# Patient Record
Sex: Female | Born: 1950 | Race: White | Hispanic: No | Marital: Single | Smoking: Former smoker
Health system: Southern US, Community
[De-identification: ages and names within clinical notes are randomized; demographics above are authoritative.]

## PROBLEM LIST (undated history)

## (undated) DIAGNOSIS — F419 Anxiety disorder, unspecified: Secondary | ICD-10-CM

## (undated) DIAGNOSIS — I10 Essential (primary) hypertension: Secondary | ICD-10-CM

## (undated) DIAGNOSIS — J45909 Unspecified asthma, uncomplicated: Secondary | ICD-10-CM

## (undated) DIAGNOSIS — C801 Malignant (primary) neoplasm, unspecified: Secondary | ICD-10-CM

## (undated) DIAGNOSIS — E785 Hyperlipidemia, unspecified: Secondary | ICD-10-CM

## (undated) DIAGNOSIS — A419 Sepsis, unspecified organism: Secondary | ICD-10-CM

## (undated) DIAGNOSIS — J189 Pneumonia, unspecified organism: Secondary | ICD-10-CM

## (undated) DIAGNOSIS — R51 Headache: Secondary | ICD-10-CM

## (undated) DIAGNOSIS — J449 Chronic obstructive pulmonary disease, unspecified: Secondary | ICD-10-CM

## (undated) HISTORY — DX: Anxiety disorder, unspecified: F41.9

## (undated) HISTORY — PX: COSMETIC SURGERY: SHX468

## (undated) HISTORY — DX: Headache: R51

## (undated) HISTORY — PX: HAND SURGERY: SHX662

## (undated) HISTORY — DX: Hyperlipidemia, unspecified: E78.5

## (undated) HISTORY — DX: Pneumonia, unspecified organism: J18.9

## (undated) HISTORY — PX: CYSTECTOMY: SUR359

## (undated) HISTORY — DX: Sepsis, unspecified organism: A41.9

## (undated) HISTORY — DX: Chronic obstructive pulmonary disease, unspecified: J44.9

## (undated) HISTORY — DX: Unspecified asthma, uncomplicated: J45.909

## (undated) HISTORY — DX: Essential (primary) hypertension: I10

---

## 2006-11-13 ENCOUNTER — Ambulatory Visit: Payer: Self-pay

## 2007-12-10 ENCOUNTER — Ambulatory Visit: Payer: Self-pay | Admitting: Family Medicine

## 2007-12-13 ENCOUNTER — Ambulatory Visit: Payer: Self-pay | Admitting: *Deleted

## 2008-01-07 ENCOUNTER — Ambulatory Visit (HOSPITAL_COMMUNITY): Admission: RE | Admit: 2008-01-07 | Discharge: 2008-01-07 | Payer: Self-pay | Admitting: Family Medicine

## 2008-01-21 ENCOUNTER — Ambulatory Visit: Payer: Self-pay | Admitting: Internal Medicine

## 2008-01-21 ENCOUNTER — Encounter (INDEPENDENT_AMBULATORY_CARE_PROVIDER_SITE_OTHER): Payer: Self-pay | Admitting: Family Medicine

## 2008-01-21 LAB — CONVERTED CEMR LAB
ALT: 20 U/L
AST: 19 U/L
Albumin: 4.5 g/dL
Alkaline Phosphatase: 122 U/L — ABNORMAL HIGH
BUN: 12 mg/dL
Basophils Absolute: 0 K/uL
Basophils Relative: 1 %
CO2: 28 meq/L
Calcium: 9 mg/dL
Chloride: 97 meq/L
Cholesterol: 224 mg/dL — ABNORMAL HIGH
Creatinine, Ser: 0.63 mg/dL
Eosinophils Absolute: 0.3 K/uL
Eosinophils Relative: 5 %
Free T4: 0.87 ng/dL — ABNORMAL LOW
Glucose, Bld: 91 mg/dL
HCT: 44 %
HDL: 50 mg/dL
Hemoglobin: 14.3 g/dL
LDL Cholesterol: 143 mg/dL — ABNORMAL HIGH
Lymphocytes Relative: 36 %
Lymphs Abs: 2.2 K/uL
MCHC: 32.5 g/dL
MCV: 96.9 fL
Monocytes Absolute: 0.8 K/uL
Monocytes Relative: 12 %
Neutro Abs: 2.9 K/uL
Neutrophils Relative %: 47 %
Platelets: 282 K/uL
Potassium: 4 meq/L
RBC: 4.54 M/uL
RDW: 14 %
Sodium: 137 meq/L
TSH: 3.541 u[IU]/mL
Total Bilirubin: 0.3 mg/dL
Total CHOL/HDL Ratio: 4.5
Total Protein: 7.1 g/dL
Triglycerides: 155 mg/dL — ABNORMAL HIGH
VLDL: 31 mg/dL
Vit D, 1,25-Dihydroxy: 9 — ABNORMAL LOW
WBC: 6.1 10*3/microliter

## 2008-02-19 ENCOUNTER — Ambulatory Visit: Payer: Self-pay | Admitting: Family Medicine

## 2008-03-20 ENCOUNTER — Emergency Department (HOSPITAL_COMMUNITY): Admission: EM | Admit: 2008-03-20 | Discharge: 2008-03-20 | Payer: Self-pay | Admitting: Family Medicine

## 2008-04-16 ENCOUNTER — Ambulatory Visit: Payer: Self-pay | Admitting: Family Medicine

## 2008-04-16 LAB — CONVERTED CEMR LAB
AST: 15 units/L (ref 0–37)
Albumin: 4.3 g/dL (ref 3.5–5.2)
Alkaline Phosphatase: 126 units/L — ABNORMAL HIGH (ref 39–117)
BUN: 13 mg/dL (ref 6–23)
Calcium: 9.3 mg/dL (ref 8.4–10.5)
Chloride: 98 meq/L (ref 96–112)
Glucose, Bld: 89 mg/dL (ref 70–99)
Potassium: 3.9 meq/L (ref 3.5–5.3)
Sodium: 138 meq/L (ref 135–145)
Total Protein: 7.4 g/dL (ref 6.0–8.3)

## 2008-05-07 ENCOUNTER — Encounter: Admission: RE | Admit: 2008-05-07 | Discharge: 2008-06-01 | Payer: Self-pay | Admitting: Orthopedic Surgery

## 2008-09-03 ENCOUNTER — Ambulatory Visit: Payer: Self-pay | Admitting: Internal Medicine

## 2008-12-31 ENCOUNTER — Ambulatory Visit: Payer: Self-pay | Admitting: Internal Medicine

## 2008-12-31 ENCOUNTER — Encounter (INDEPENDENT_AMBULATORY_CARE_PROVIDER_SITE_OTHER): Payer: Self-pay | Admitting: Family Medicine

## 2009-01-07 ENCOUNTER — Encounter: Admission: RE | Admit: 2009-01-07 | Discharge: 2009-01-07 | Payer: Self-pay | Admitting: Internal Medicine

## 2009-03-29 ENCOUNTER — Ambulatory Visit: Payer: Self-pay | Admitting: Family Medicine

## 2009-05-27 ENCOUNTER — Ambulatory Visit: Payer: Self-pay | Admitting: Family Medicine

## 2009-06-24 ENCOUNTER — Ambulatory Visit (HOSPITAL_COMMUNITY): Admission: RE | Admit: 2009-06-24 | Discharge: 2009-06-24 | Payer: Self-pay | Admitting: Family Medicine

## 2009-07-30 ENCOUNTER — Ambulatory Visit (HOSPITAL_COMMUNITY): Admission: RE | Admit: 2009-07-30 | Discharge: 2009-07-30 | Payer: Self-pay | Admitting: Family Medicine

## 2009-09-16 ENCOUNTER — Ambulatory Visit: Payer: Self-pay | Admitting: Internal Medicine

## 2009-09-21 ENCOUNTER — Ambulatory Visit: Payer: Self-pay | Admitting: Internal Medicine

## 2009-09-21 LAB — CONVERTED CEMR LAB: Pap Smear: NEGATIVE

## 2009-12-17 ENCOUNTER — Ambulatory Visit: Payer: Self-pay | Admitting: Family Medicine

## 2010-01-26 ENCOUNTER — Ambulatory Visit (HOSPITAL_COMMUNITY)
Admission: RE | Admit: 2010-01-26 | Discharge: 2010-01-26 | Payer: Self-pay | Source: Home / Self Care | Admitting: Internal Medicine

## 2010-03-20 ENCOUNTER — Encounter: Payer: Self-pay | Admitting: Internal Medicine

## 2011-10-29 DIAGNOSIS — A419 Sepsis, unspecified organism: Secondary | ICD-10-CM

## 2011-10-29 DIAGNOSIS — J189 Pneumonia, unspecified organism: Secondary | ICD-10-CM

## 2011-10-29 HISTORY — DX: Sepsis, unspecified organism: A41.9

## 2011-10-29 HISTORY — DX: Pneumonia, unspecified organism: J18.9

## 2011-11-01 ENCOUNTER — Ambulatory Visit (INDEPENDENT_AMBULATORY_CARE_PROVIDER_SITE_OTHER)
Admission: RE | Admit: 2011-11-01 | Discharge: 2011-11-01 | Disposition: A | Discharge status: Home / Self Care | Payer: Self-pay | Source: Ambulatory Visit | Attending: Pulmonary Disease | Admitting: Pulmonary Disease

## 2011-11-01 ENCOUNTER — Encounter: Payer: Self-pay | Admitting: Pulmonary Disease

## 2011-11-01 ENCOUNTER — Ambulatory Visit (INDEPENDENT_AMBULATORY_CARE_PROVIDER_SITE_OTHER): Payer: Self-pay | Admitting: Pulmonary Disease

## 2011-11-01 VITALS — BP 138/82 | HR 103 | Temp 98.8°F | Ht 63.0 in

## 2011-11-01 DIAGNOSIS — J449 Chronic obstructive pulmonary disease, unspecified: Secondary | ICD-10-CM

## 2011-11-01 DIAGNOSIS — J441 Chronic obstructive pulmonary disease with (acute) exacerbation: Secondary | ICD-10-CM | POA: Insufficient documentation

## 2011-11-01 DIAGNOSIS — J9611 Chronic respiratory failure with hypoxia: Secondary | ICD-10-CM

## 2011-11-01 DIAGNOSIS — J9610 Chronic respiratory failure, unspecified whether with hypoxia or hypercapnia: Secondary | ICD-10-CM

## 2011-11-01 DIAGNOSIS — Z720 Tobacco use: Secondary | ICD-10-CM

## 2011-11-01 DIAGNOSIS — J4489 Other specified chronic obstructive pulmonary disease: Secondary | ICD-10-CM

## 2011-11-01 DIAGNOSIS — R0902 Hypoxemia: Secondary | ICD-10-CM

## 2011-11-01 DIAGNOSIS — Z0000 Encounter for general adult medical examination without abnormal findings: Secondary | ICD-10-CM

## 2011-11-01 DIAGNOSIS — F17200 Nicotine dependence, unspecified, uncomplicated: Secondary | ICD-10-CM

## 2011-11-01 NOTE — Assessment & Plan Note (Signed)
Explained how continued tobacco abuse will contribute to her respiratory difficulties.  Discussed options to help with smoking cessation.  Will revisit this at her follow up appointment.

## 2011-11-01 NOTE — Patient Instructions (Addendum)
Prednisone 5 mg pills>>alternate 1 pill with 1/2 pill every other day for 2 weeks.  If breathing okay, then take 1/2 pill daily for 2 weeks.  If breathing okay, then take 1/2 pill every other day for 2 weeks, and then stop prednisone Chest xray today>>will call with results Use 2 liters oxygen at rest and 3 liters with exertion and sleep.  Will arrange for overnight oxygen test. Advair one puff twice per day Combivent two puffs as needed for cough, wheeze, or chest congestion Will arrange for primary care evaluation Follow up in 8 weeks

## 2011-11-01 NOTE — Assessment & Plan Note (Signed)
She had significant oxygen desaturation at rest.  She needs to use 2 liters oxygen at rest, and 3 liters with exertion.  Explained this is likely contributing to her exercise limitation and muscle cramping.

## 2011-11-01 NOTE — Progress Notes (Signed)
Chief Complaint  Patient presents with  . Advice Only    refer Dr. Clelia Croft. c/o SOB w/ activity and rest, cough w/ clear/brown phlem, wheezing, chest tx. SOB has been going on x  2years. can walk only 5 feet and feel SOB    History of Present Illness: Alexa Stephens is a 61 y.o. female smoker for evaluation of COPD.  She was previously followed at [facility].  She was told she had asthma years ago.  She was diagnosed with COPD several years ago after having a breathing test.  She gets winded with minimal activity.  She also gets frequent muscle cramps.  She has frequent cough, but not much sputum or wheeze.  Her weight has been steady recently.  She gets occasional ankle swelling.  She has been on daily prednisone since July.  She uses combivent and/or albuterol several times per week.  She feels advair helps her breathing.  She is from [place], but has lived in [place] for several years.  She worked in Engineering geologist.  She denies any recent travel or sick exposures.  She denies animal exposures.  She denies history of pneumonia or exposure to tuberculosis.  She was never told before that she needed oxygen.  She continues to smoke 2 to 3 packs in a week.   Past Medical History  Diagnosis Date  . Anxiety disorder   . Asthma   . COPD (chronic obstructive pulmonary disease)   . Headache   . Hyperlipemia   . Hypertension     Past Surgical History  Procedure Date  . Cosmetic surgery   . Cystectomy   . Hand surgery     Current Outpatient Prescriptions on File Prior to Visit  Medication Sig Dispense Refill  . albuterol (PROVENTIL HFA;VENTOLIN HFA) 108 (90 BASE) MCG/ACT inhaler Inhale 2 puffs into the lungs every 4 (four) hours as needed.      Marland Kitchen albuterol-ipratropium (COMBIVENT) 18-103 MCG/ACT inhaler Inhale 2 puffs into the lungs every 6 (six) hours as needed.      Marland Kitchen amLODipine (NORVASC) 10 MG tablet Take 10 mg by mouth daily.      Marland Kitchen doxepin (SINEQUAN) 25 MG capsule Take 25 mg by mouth  daily.      Marland Kitchen loratadine (CLARITIN) 10 MG tablet Take 10 mg by mouth daily.      Marland Kitchen losartan (COZAAR) 100 MG tablet Take 100 mg by mouth daily.      . simvastatin (ZOCOR) 20 MG tablet Take 20 mg by mouth every evening.      . venlafaxine XR (EFFEXOR-XR) 150 MG 24 hr capsule Take 150 mg by mouth daily.        Allergies  Allergen Reactions  . Lexapro (Escitalopram)     Gi upset  . Naproxen     High BP  . Skelaxin (Metaxalone)     Family History  Problem Relation Age of Onset  . Lung cancer Maternal Grandfather   . Cancer Maternal Grandmother     History  Substance Use Topics  . Smoking status: Current Everyday Smoker -- 40 years  . Smokeless tobacco: Not on file   Comment: 2-3 packs per week  . Alcohol Use: No    Review of Systems  Constitutional: Negative for fever, appetite change and unexpected weight change.  HENT: Negative for ear pain, congestion, sore throat, trouble swallowing and dental problem.   Respiratory: Positive for cough and shortness of breath.   Cardiovascular: Positive for palpitations and leg swelling. Negative  for chest pain.  Gastrointestinal: Negative for abdominal pain.  Musculoskeletal: Negative for joint swelling.  Skin: Negative for rash.  Neurological: Positive for headaches.  Psychiatric/Behavioral: Positive for dysphoric mood. The patient is nervous/anxious.    Physical Exam: Filed Vitals:   11/01/11 1610  BP: 138/82  Pulse: 103  Temp: 98.8 F (37.1 C)  TempSrc: Oral  Height: 5\' 3"  (1.6 m)  SpO2: 85%  ,  Current Encounter SPO2  11/01/11 1610 85%    Wt Readings from Last 3 Encounters:  No data found for Wt    There is no weight on file to calculate BMI.   General - No distress, dyspneic with activity ENT - TM clear, no sinus tenderness, no oral exudate, no LAN, no thyromegaly Cardiac - s1s2 regular, no murmur, pulses symmetric, no edema Chest - prolonged exhalation, decreased breath sounds, no wheeze/rales Back - no focal  tenderness Abd - soft, non-tender, no organomegaly, + bowel sounds Ext - normal motor strength, deformity right hand Neuro - Cranial nerves are normal. PERLA. EOM's intact. Skin - no discernible active dermatitis, erythema, urticaria or inflammatory process. Psych - normal mood, and behavior.   CHEST - 2 VIEW 06/24/09 Comparison: None.  Findings: The lungs are hyperaerated consistent with COPD.  Somewhat prominent interstitial markings appear chronic. No active infiltrate or effusion is seen. The heart is within normal limits in size. There is a mild thoracic scoliosis present with degenerative change.  IMPRESSION:  COPD. No active lung disease.   Lab Results  Component Value Date   WBC 6.1 01/21/2008   HGB 14.3 01/21/2008   HCT 44.0 01/21/2008   MCV 96.9 01/21/2008   PLT 282 01/21/2008    Lab Results  Component Value Date   CREATININE 0.69 04/16/2008   BUN 13 04/16/2008   NA 138 04/16/2008   K 3.9 04/16/2008   CL 98 04/16/2008   CO2 29 04/16/2008    Lab Results  Component Value Date   ALT 13 04/16/2008   AST 15 04/16/2008   ALKPHOS 126* 04/16/2008   BILITOT 0.4 04/16/2008    Lab Results  Component Value Date   TSH 3.541 01/21/2008   Spirometry 11/01/11>>FEV1 0.48 (21%), FEV1% 37 (difficult pt effort).  Assessment/Plan:  [name], MD [facility] Pager:  [phone] 11/01/2011, 5:13 PM

## 2011-11-01 NOTE — Progress Notes (Deleted)
  Subjective:    Patient ID: Alexa Stephens, female    DOB: 1950/03/25, 61 y.o.   MRN: [id number]  HPI    Review of Systems  Constitutional: Negative for fever, appetite change and unexpected weight change.  HENT: Negative for ear pain, congestion, sore throat, trouble swallowing and dental problem.   Respiratory: Positive for cough and shortness of breath.   Cardiovascular: Positive for palpitations and leg swelling. Negative for chest pain.  Gastrointestinal: Negative for abdominal pain.  Musculoskeletal: Negative for joint swelling.  Skin: Negative for rash.  Neurological: Positive for headaches.  Psychiatric/Behavioral: Positive for dysphoric mood. The patient is nervous/anxious.        Objective:   Physical Exam        Assessment & Plan:

## 2011-11-01 NOTE — Assessment & Plan Note (Signed)
She was previously followed by [facility].  She needs new primary care.  Will try to help set this up.  Hopefully, once she gets medicaid approval (she reports this will be in April 2014), then she will have easier time getting medical follow up.

## 2011-11-01 NOTE — Assessment & Plan Note (Addendum)
She has severe COPD based on spirometry today.  She has difficulty affording her medications.  I have given her samples of advair.  She is to continue advair and prn combivent.  Explained that combivent has albuterol in it, and that she does not have to pay for two separate inhalers for her "asthma" and "COPD".    Will try to gradually wean her off prednisone as tolerated.    Hopefully once she is approved for medicaid, then will be able to adjust her regimen further.  Would also likely benefit from pulmonary rehab.  Will need to consider checking A1AT levels at some point also.

## 2011-11-02 ENCOUNTER — Telehealth: Payer: Self-pay | Admitting: Pulmonary Disease

## 2011-11-02 NOTE — Telephone Encounter (Signed)
Dg Chest 2 View  11/01/2011  *RADIOLOGY REPORT*  Clinical Data: COPD, shortness of breath  CHEST - 2 VIEW  Comparison: 06/24/2009  Findings: Chronic interstitial markings/emphysematous changes. No pleural effusion or pneumothorax.  Cardiomediastinal silhouette is within normal limits.  Degenerative changes of the visualized thoracolumbar spine.  IMPRESSION: No evidence of acute cardiopulmonary disease.  Chronic interstitial markings/emphysematous changes.   Original Report Authenticated By: Charline Bills, M.D.     Will have my nurse inform patient that CXR shows expected changes from COPD.  No other findings.  No change to plan as discussed at visit on 9/04.

## 2011-11-03 NOTE — Telephone Encounter (Signed)
lmomtcb x1 for pt 

## 2011-11-06 NOTE — Telephone Encounter (Signed)
lmomtcb x 2  

## 2011-11-07 NOTE — Telephone Encounter (Signed)
lmtcb x 3 will send letter out to pt home.

## 2011-11-08 ENCOUNTER — Encounter: Payer: Self-pay | Admitting: *Deleted

## 2011-11-08 DIAGNOSIS — J449 Chronic obstructive pulmonary disease, unspecified: Secondary | ICD-10-CM | POA: Insufficient documentation

## 2011-11-08 NOTE — Telephone Encounter (Signed)
I have sent letter out to pt home.

## 2011-11-15 NOTE — Telephone Encounter (Signed)
I called cell # listed and was advised I had the wrong #. i double checked the # listed and it was the same #. Called home # and it stated it was a magic jack # and was not able to reach pt. i called her son listed and he is on file to discuss results with. i advised him of this and he stated he will try and get in touch with his mother to give these to her.

## 2011-11-16 ENCOUNTER — Telehealth: Payer: Self-pay | Admitting: Pulmonary Disease

## 2011-11-16 NOTE — Telephone Encounter (Signed)
ATC the pt on home number and could not reach since she has a Animal nutritionist phone [email or web address] on her cell number

## 2011-11-17 ENCOUNTER — Emergency Department (HOSPITAL_COMMUNITY): Payer: Self-pay

## 2011-11-17 ENCOUNTER — Inpatient Hospital Stay (HOSPITAL_COMMUNITY)
Admission: EM | Admit: 2011-11-17 | Discharge: 2011-11-23 | DRG: 871 | Disposition: A | Discharge status: Home Health | Payer: MEDICAID | Attending: Internal Medicine | Admitting: Internal Medicine

## 2011-11-17 ENCOUNTER — Encounter (HOSPITAL_COMMUNITY): Payer: Self-pay | Admitting: Family Medicine

## 2011-11-17 ENCOUNTER — Telehealth: Payer: Self-pay | Admitting: Pulmonary Disease

## 2011-11-17 DIAGNOSIS — J441 Chronic obstructive pulmonary disease with (acute) exacerbation: Secondary | ICD-10-CM

## 2011-11-17 DIAGNOSIS — R51 Headache: Secondary | ICD-10-CM | POA: Diagnosis present

## 2011-11-17 DIAGNOSIS — F3289 Other specified depressive episodes: Secondary | ICD-10-CM | POA: Diagnosis present

## 2011-11-17 DIAGNOSIS — R5381 Other malaise: Secondary | ICD-10-CM

## 2011-11-17 DIAGNOSIS — A419 Sepsis, unspecified organism: Principal | ICD-10-CM

## 2011-11-17 DIAGNOSIS — F0390 Unspecified dementia without behavioral disturbance: Secondary | ICD-10-CM | POA: Diagnosis present

## 2011-11-17 DIAGNOSIS — J189 Pneumonia, unspecified organism: Secondary | ICD-10-CM

## 2011-11-17 DIAGNOSIS — J9611 Chronic respiratory failure with hypoxia: Secondary | ICD-10-CM

## 2011-11-17 DIAGNOSIS — I10 Essential (primary) hypertension: Secondary | ICD-10-CM

## 2011-11-17 DIAGNOSIS — G9341 Metabolic encephalopathy: Secondary | ICD-10-CM | POA: Diagnosis present

## 2011-11-17 DIAGNOSIS — Z23 Encounter for immunization: Secondary | ICD-10-CM

## 2011-11-17 DIAGNOSIS — E871 Hypo-osmolality and hyponatremia: Secondary | ICD-10-CM | POA: Diagnosis present

## 2011-11-17 DIAGNOSIS — J449 Chronic obstructive pulmonary disease, unspecified: Secondary | ICD-10-CM

## 2011-11-17 DIAGNOSIS — F32A Depression, unspecified: Secondary | ICD-10-CM

## 2011-11-17 DIAGNOSIS — R0902 Hypoxemia: Secondary | ICD-10-CM

## 2011-11-17 DIAGNOSIS — J45901 Unspecified asthma with (acute) exacerbation: Secondary | ICD-10-CM | POA: Diagnosis present

## 2011-11-17 DIAGNOSIS — E785 Hyperlipidemia, unspecified: Secondary | ICD-10-CM

## 2011-11-17 DIAGNOSIS — Z79899 Other long term (current) drug therapy: Secondary | ICD-10-CM

## 2011-11-17 DIAGNOSIS — F329 Major depressive disorder, single episode, unspecified: Secondary | ICD-10-CM

## 2011-11-17 DIAGNOSIS — J9620 Acute and chronic respiratory failure, unspecified whether with hypoxia or hypercapnia: Secondary | ICD-10-CM

## 2011-11-17 DIAGNOSIS — F411 Generalized anxiety disorder: Secondary | ICD-10-CM | POA: Diagnosis present

## 2011-11-17 LAB — CBC
HCT: 39 % (ref 36.0–46.0)
Hemoglobin: 13.4 g/dL (ref 12.0–15.0)
MCHC: 34.4 g/dL (ref 30.0–36.0)
RBC: 4.05 MIL/uL (ref 3.87–5.11)

## 2011-11-17 LAB — POCT I-STAT TROPONIN I

## 2011-11-17 LAB — BASIC METABOLIC PANEL
BUN: 6 mg/dL (ref 6–23)
Chloride: 90 mEq/L — ABNORMAL LOW (ref 96–112)
GFR calc Af Amer: >90 mL/min (ref >90)
Glucose, Bld: 139 mg/dL — ABNORMAL HIGH (ref 70–99)
Potassium: 4 mEq/L (ref 3.5–5.1)

## 2011-11-17 MED ORDER — ALBUTEROL SULFATE (5 MG/ML) 0.5% IN NEBU
2.5000 mg | INHALATION_SOLUTION | Freq: Four times a day (QID) | RESPIRATORY_TRACT | Status: DC
  Administered 2011-11-18 – 2011-11-20 (×11): 2.5 mg via RESPIRATORY_TRACT
  Filled 2011-11-17 (×11): qty 0.5

## 2011-11-17 MED ORDER — SODIUM CHLORIDE 0.9 % IV SOLN
INTRAVENOUS | Status: DC
  Administered 2011-11-18 – 2011-11-20 (×9): via INTRAVENOUS

## 2011-11-17 MED ORDER — METHYLPREDNISOLONE SODIUM SUCC 40 MG IJ SOLR
40.0000 mg | Freq: Four times a day (QID) | INTRAMUSCULAR | Status: DC
  Administered 2011-11-18 – 2011-11-19 (×6): 40 mg via INTRAVENOUS
  Filled 2011-11-17 (×9): qty 1

## 2011-11-17 MED ORDER — HEPARIN SODIUM (PORCINE) 5000 UNIT/ML IJ SOLN
5000.0000 [IU] | Freq: Three times a day (TID) | INTRAMUSCULAR | Status: DC
  Administered 2011-11-18 – 2011-11-23 (×16): 5000 [IU] via SUBCUTANEOUS
  Filled 2011-11-17 (×20): qty 1

## 2011-11-17 MED ORDER — IPRATROPIUM-ALBUTEROL 18-103 MCG/ACT IN AERO
2.0000 | INHALATION_SPRAY | Freq: Four times a day (QID) | RESPIRATORY_TRACT | Status: DC | PRN
  Filled 2011-11-17: qty 14.7

## 2011-11-17 MED ORDER — DEXTROSE 5 % IV SOLN
500.0000 mg | INTRAVENOUS | Status: DC
  Administered 2011-11-19 – 2011-11-20 (×3): 500 mg via INTRAVENOUS
  Filled 2011-11-17 (×4): qty 500

## 2011-11-17 MED ORDER — DEXTROSE 5 % IV SOLN
1.0000 g | Freq: Once | INTRAVENOUS | Status: AC
  Administered 2011-11-18: 1 g via INTRAVENOUS
  Filled 2011-11-17: qty 10

## 2011-11-17 MED ORDER — SIMVASTATIN 20 MG PO TABS
20.0000 mg | ORAL_TABLET | Freq: Every evening | ORAL | Status: DC
  Administered 2011-11-18 – 2011-11-22 (×5): 20 mg via ORAL
  Filled 2011-11-17 (×6): qty 1

## 2011-11-17 MED ORDER — VENLAFAXINE HCL ER 75 MG PO CP24: 75.0000 mg | ORAL_CAPSULE | Freq: Every day | ORAL | Status: DC

## 2011-11-17 MED ORDER — DEXTROSE 5 % IV SOLN
500.0000 mg | Freq: Once | INTRAVENOUS | Status: AC
  Administered 2011-11-17: 500 mg via INTRAVENOUS
  Filled 2011-11-17: qty 500

## 2011-11-17 MED ORDER — FLUTICASONE-SALMETEROL 500-50 MCG/DOSE IN AEPB
1.0000 | INHALATION_SPRAY | Freq: Two times a day (BID) | RESPIRATORY_TRACT | Status: DC
  Administered 2011-11-18 – 2011-11-23 (×11): 1 via RESPIRATORY_TRACT
  Filled 2011-11-17: qty 14

## 2011-11-17 MED ORDER — IPRATROPIUM BROMIDE 0.02 % IN SOLN
0.5000 mg | Freq: Once | RESPIRATORY_TRACT | Status: AC
  Administered 2011-11-17: 0.5 mg via RESPIRATORY_TRACT
  Filled 2011-11-17: qty 2.5

## 2011-11-17 MED ORDER — DOXEPIN HCL 25 MG PO CAPS
25.0000 mg | ORAL_CAPSULE | Freq: Every day | ORAL | Status: DC
  Administered 2011-11-18 – 2011-11-23 (×6): 25 mg via ORAL
  Filled 2011-11-17 (×6): qty 1

## 2011-11-17 MED ORDER — SODIUM CHLORIDE 0.9 % IV BOLUS (SEPSIS)
1000.0000 mL | Freq: Once | INTRAVENOUS | Status: AC
  Administered 2011-11-17: 1000 mL via INTRAVENOUS

## 2011-11-17 MED ORDER — DEXTROSE 5 % IV SOLN
1.0000 g | INTRAVENOUS | Status: DC
  Administered 2011-11-18 – 2011-11-22 (×5): 1 g via INTRAVENOUS
  Filled 2011-11-17 (×5): qty 10

## 2011-11-17 MED ORDER — VENLAFAXINE HCL ER 150 MG PO CP24: 150.0000 mg | ORAL_CAPSULE | Freq: Every day | ORAL | Status: DC

## 2011-11-17 MED ORDER — ALBUTEROL SULFATE (5 MG/ML) 0.5% IN NEBU
5.0000 mg | INHALATION_SOLUTION | Freq: Once | RESPIRATORY_TRACT | Status: AC
  Administered 2011-11-17: 2.5 mg via RESPIRATORY_TRACT
  Filled 2011-11-17: qty 0.5

## 2011-11-17 MED ORDER — SODIUM CHLORIDE 0.9 % IV SOLN: INTRAVENOUS | Status: DC

## 2011-11-17 MED ORDER — LORATADINE 10 MG PO TABS
10.0000 mg | ORAL_TABLET | Freq: Every day | ORAL | Status: DC
  Administered 2011-11-20 – 2011-11-23 (×4): 10 mg via ORAL
  Filled 2011-11-17 (×6): qty 1

## 2011-11-17 MED ORDER — ALBUTEROL SULFATE (5 MG/ML) 0.5% IN NEBU
5.0000 mg | INHALATION_SOLUTION | Freq: Once | RESPIRATORY_TRACT | Status: AC
  Administered 2011-11-17: 5 mg via RESPIRATORY_TRACT
  Filled 2011-11-17 (×2): qty 0.5

## 2011-11-17 MED ORDER — ALBUTEROL SULFATE HFA 108 (90 BASE) MCG/ACT IN AERS: 2.0000 | INHALATION_SPRAY | RESPIRATORY_TRACT | Status: DC | PRN

## 2011-11-17 MED ORDER — VENLAFAXINE HCL ER 75 MG PO CP24
225.0000 mg | ORAL_CAPSULE | Freq: Every day | ORAL | Status: DC
  Administered 2011-11-18 – 2011-11-23 (×6): 225 mg via ORAL
  Filled 2011-11-17 (×6): qty 1

## 2011-11-17 MED ORDER — BUTALBITAL-APAP-CAFFEINE 50-325-40 MG PO TABS: 1.0000 | ORAL_TABLET | ORAL | Status: DC | PRN

## 2011-11-17 NOTE — ED Notes (Signed)
Pt requesting medication for pain. Dr. Adriana Simas notified.

## 2011-11-17 NOTE — H&P (Addendum)
[facility] History and Physical  Alexa Stephens ZOX:[id number] DOB: 12-09-1950 DOA: 11/17/2011  Referring physician: ED PCP: No primary provider on file.   Chief Complaint: SOB  HPI: Alexa Stephens is a 61 y.o. female who presents with worsening SOB, cough, fever, chills.  Onset 1 week ago, progressively worsening over that time, slightly better on Oxygen and sitting up.  Occurs in the context of a history of COPD, patient has been prescribed home O2 in the past but this hasnt arrived yet.  Because of her symptoms she came to the ED, Lab work up showed a WBC 18k, s.tach in the 120s, T 99.2 but she has been taking NSAIDS to keep fever down, CXR demonstrated a RLL PNA.  Hospitalist has been asked to admit her for sepsis and CAP.  Review of Systems: 12 systems reviewed and otherwise negative.  Past Medical History  Diagnosis Date  . Anxiety disorder   . Asthma   . COPD (chronic obstructive pulmonary disease)     Not on home O2 yet but is prescribed.  . Headache   . Hyperlipemia   . Hypertension    Past Surgical History  Procedure Date  . Cosmetic surgery   . Cystectomy   . Hand surgery    Social History:  reports that she has been smoking.  She does not have any smokeless tobacco history on file. She reports that she does not drink alcohol or use illicit drugs. Patient lives at home, severe COPD just about to start home O2.  Allergies  Allergen Reactions  . Lexapro (Escitalopram)     Gi upset  . Naproxen     High BP  . Skelaxin (Metaxalone)     Family History  Problem Relation Age of Onset  . Lung cancer Maternal Grandfather   . Cancer Maternal Grandmother    Prior to Admission medications   Medication Sig Start Date End Date Taking? Authorizing Provider  albuterol (PROVENTIL HFA;VENTOLIN HFA) 108 (90 BASE) MCG/ACT inhaler Inhale 2 puffs into the lungs every 4 (four) hours as needed. For shortness of breath.   Yes Historical Provider, MD  albuterol-ipratropium (COMBIVENT)  18-103 MCG/ACT inhaler Inhale 2 puffs into the lungs every 6 (six) hours as needed. For shortness of breath.   Yes Historical Provider, MD  amLODipine (NORVASC) 10 MG tablet Take 10 mg by mouth daily.   Yes Historical Provider, MD  butalbital-acetaminophen-caffeine (FIORICET WITH CODEINE) 50-325-40-30 MG per capsule Take 1 capsule by mouth every 4 (four) hours as needed. For headaches.   Yes Historical Provider, MD  doxepin (SINEQUAN) 25 MG capsule Take 25 mg by mouth daily.   Yes Historical Provider, MD  Fluticasone-Salmeterol (ADVAIR) 500-50 MCG/DOSE AEPB Inhale 1 puff into the lungs every 12 (twelve) hours.   Yes Historical Provider, MD  loratadine (CLARITIN) 10 MG tablet Take 10 mg by mouth daily.   Yes Historical Provider, MD  losartan (COZAAR) 100 MG tablet Take 100 mg by mouth daily.   Yes Historical Provider, MD  predniSONE (DELTASONE) 5 MG tablet Take 2.5-5 mg by mouth daily. Alternating between 0.5 tab and 1 tab daily.   Yes Historical Provider, MD  simvastatin (ZOCOR) 20 MG tablet Take 20 mg by mouth every evening.   Yes Historical Provider, MD  venlafaxine XR (EFFEXOR-XR) 150 MG 24 hr capsule Take 150 mg by mouth daily. Taken with 75mg  cap to equal 225mg  daily.   Yes Historical Provider, MD  venlafaxine XR (EFFEXOR-XR) 75 MG 24 hr capsule Take 75 mg  by mouth daily. Taken with 150mg  cap to equal 225mg  daily.   Yes Historical Provider, MD   Physical Exam: Filed Vitals:   11/17/11 2021 11/17/11 2022 11/17/11 2146 11/17/11 2248  BP:   125/62 112/59  Pulse:      Temp:      TempSrc:      Resp:   20 26  SpO2: 89% 93% 92% 90%     General:  Mild distress, showing significantly increased work of breathing  Eyes: PEERLA EOMI  ENT: dry mucous membranes  Neck: supple w/o JVD  Cardiovascular: RRR w/o MRG  Respiratory: diffuse wheezing and rales B lung fields, decreased breath sounds on R side.  Abdomen: soft, nt, nd, bs+  Skin: trace BLE edema  Musculoskeletal: MAE 5/5 strength  all 4 extremities  Neurologic: grossly non-focal, AAOx3, answers questions apropriately  Labs on Admission:  Basic Metabolic Panel:  Lab 11/17/11 1610  NA 128*  K 4.0  CL 90*  CO2 28  GLUCOSE 139*  BUN 6  CREATININE 0.55  CALCIUM 9.1  MG --  PHOS --   Liver Function Tests: No results found for this basename: AST:5,ALT:5,ALKPHOS:5,BILITOT:5,PROT:5,ALBUMIN:5 in the last 168 hours No results found for this basename: LIPASE:5,AMYLASE:5 in the last 168 hours No results found for this basename: AMMONIA:5 in the last 168 hours CBC:  Lab 11/17/11 2040  WBC 18.6*  NEUTROABS --  HGB 13.4  HCT 39.0  MCV 96.3  PLT 243   Cardiac Enzymes: No results found for this basename: CKTOTAL:5,CKMB:5,CKMBINDEX:5,TROPONINI:5 in the last 168 hours  BNP (last 3 results) No results found for this basename: PROBNP:3 in the last 8760 hours CBG: No results found for this basename: GLUCAP:5 in the last 168 hours  Radiological Exams on Admission: Dg Chest 2 View (if Patient Has Fever And/or Copd)  11/17/2011  *RADIOLOGY REPORT*  Clinical Data: SOB, cough  CHEST - 2 VIEW  Comparison: 11/01/2011  Findings: Patchy right lower lobe opacity, suspicious for pneumonia.  Underlying chronic interstitial markings/emphysematous changes. No pleural effusion or pneumothorax.  Cardiomediastinal silhouette is within normal limits.  Degenerative changes of the visualized thoracolumbar spine.  IMPRESSION: Suspected right lower lobe pneumonia.   Original Report Authenticated By: Charline Bills, M.D.     EKG: Independently reviewed.  Demonstrates S. Tach  Assessment/Plan Principal Problem:  *CAP (community acquired pneumonia) Active Problems:  Acute-on-chronic respiratory failure  COPD exacerbation  Sepsis  Hypoxia   1. CAP with hypoxia - patient has RLL PNA demonstrated on CXR, started rocephin and azithromycin, on 3L O2 via [place], admitting to stepdown for monitoring. 2. Sepsis - PNA source, meets at least  3/4 sirs criteria (possibly 4/4 if she wasn't on antipyretics).  Antibiotics as per #1, aggressive fluid resusitation to try and prevent worsening.  Admitting to stepdown for closer monitoring.  Tele monitor and continuous pulse ox ordered. 3. COPD exacerbation - will start patient on stress dose steroids at this time (hold home dose of chronic steroids), despite breathing treatments she clearly has persistant wheezing and poor air movement bilaterally.  Continue home inhalors and RRT to eval and treat. 4. Acute on chronic respiratory failure - being caused by #1 and #3, treatment as per those, if patient worsens may need NIPPV or even intubation. 5. HTN - holding home meds at this time due to concern for possible worsening of sepsis. 6. Headache - likely due to sepsis syndrome, will give norco low dose to treat acutely, should wean this off during hospital stay.  Code Status: Full Code Family Communication: Son is present in room Disposition Plan: Admit to stepdown  Time spent: 70 min  Providence Stivers M. [facility] Pager [phone]  If 7PM-7AM, please contact night-coverage [email or web address] Password [email or web address] 11/17/2011, 11:07 PM

## 2011-11-17 NOTE — ED Provider Notes (Signed)
History     CSN: [id number]  Arrival date & time 11/17/11  4098   First MD Initiated Contact with Patient 11/17/11 2052      Chief Complaint  Patient presents with  . Shortness of Breath  . Fever    (Consider location/radiation/quality/duration/timing/severity/associated sxs/prior treatment) HPI....Alexa Stephens cough, wheezing, dyspnea, fever for one week. Smoker, COPD.  Went to her primary care pulmonologist this week who prescribed home oxygen. She has not been able to fill that yet. Exercise tolerance greatly reduced. Nothing makes symptoms better or worse.  severity is moderate.  Level V caveat for urgent need for intervention  Past Medical History  Diagnosis Date  . Anxiety disorder   . Asthma   . COPD (chronic obstructive pulmonary disease)   . Headache   . Hyperlipemia   . Hypertension     Past Surgical History  Procedure Date  . Cosmetic surgery   . Cystectomy   . Hand surgery     Family History  Problem Relation Age of Onset  . Lung cancer Maternal Grandfather   . Cancer Maternal Grandmother     History  Substance Use Topics  . Smoking status: Current Every Day Smoker -- 40 years  . Smokeless tobacco: Not on file   Comment: 2-3 packs per week  . Alcohol Use: No    OB History    Grav Para Term Preterm Abortions TAB SAB Ect Mult Living                  Review of Systems  Unable to perform ROS: Other    Allergies  Lexapro; Naproxen; and Skelaxin  Home Medications   Current Outpatient Rx  Name Route Sig Dispense Refill  . ALBUTEROL SULFATE HFA 108 (90 BASE) MCG/ACT IN AERS Inhalation Inhale 2 puffs into the lungs every 4 (four) hours as needed. For shortness of breath.    Maximino Greenland 18-103 MCG/ACT IN AERO Inhalation Inhale 2 puffs into the lungs every 6 (six) hours as needed. For shortness of breath.    . AMLODIPINE BESYLATE 10 MG PO TABS Oral Take 10 mg by mouth daily.    Alexa Stephens BUTALBITAL-APAP-CAFF-COD 50-325-40-30 MG PO CAPS Oral Take 1  capsule by mouth every 4 (four) hours as needed. For headaches.    Alexa Stephens DOXEPIN HCL 25 MG PO CAPS Oral Take 25 mg by mouth daily.    Alexa Stephens FLUTICASONE-SALMETEROL 500-50 MCG/DOSE IN AEPB Inhalation Inhale 1 puff into the lungs every 12 (twelve) hours.    Alexa Stephens LORATADINE 10 MG PO TABS Oral Take 10 mg by mouth daily.    Alexa Stephens LOSARTAN POTASSIUM 100 MG PO TABS Oral Take 100 mg by mouth daily.    Alexa Stephens PREDNISONE 5 MG PO TABS Oral Take 2.5-5 mg by mouth daily. Alternating between 0.5 tab and 1 tab daily.    Alexa Stephens SIMVASTATIN 20 MG PO TABS Oral Take 20 mg by mouth every evening.    . VENLAFAXINE HCL ER 150 MG PO CP24 Oral Take 150 mg by mouth daily. Taken with 75mg  cap to equal 225mg  daily.    . VENLAFAXINE HCL ER 75 MG PO CP24 Oral Take 75 mg by mouth daily. Taken with 150mg  cap to equal 225mg  daily.      BP 112/59  Pulse 126  Temp 99.3 F (37.4 C) (Oral)  Resp 26  SpO2 90%  Physical Exam  Nursing note and vitals reviewed. Constitutional: She is oriented to person, place, and time. She appears well-developed and well-nourished.  HENT:  Head: Normocephalic and atraumatic.  Eyes: Conjunctivae normal and EOM are normal. Pupils are equal, round, and reactive to light.  Neck: Normal range of motion. Neck supple.  Cardiovascular: Normal rate, regular rhythm and normal heart sounds.   Pulmonary/Chest:       Bilateral expiratory wheeze  Abdominal: Soft. Bowel sounds are normal.  Musculoskeletal: Normal range of motion.  Neurological: She is alert and oriented to person, place, and time.  Skin: Skin is warm and dry.  Psychiatric: She has a normal mood and affect.    ED Course  Procedures (including critical care time)  Labs Reviewed  BASIC METABOLIC PANEL - Abnormal; Notable for the following:    Sodium 128 (*)     Chloride 90 (*)     Glucose, Bld 139 (*)     All other components within normal limits  CBC - Abnormal; Notable for the following:    WBC 18.6 (*)     All other components within normal limits    POCT I-STAT TROPONIN I   Dg Chest 2 View (if Patient Has Fever And/or Copd)  11/17/2011  *RADIOLOGY REPORT*  Clinical Data: SOB, cough  CHEST - 2 VIEW  Comparison: 11/01/2011  Findings: Patchy right lower lobe opacity, suspicious for pneumonia.  Underlying chronic interstitial markings/emphysematous changes. No pleural effusion or pneumothorax.  Cardiomediastinal silhouette is within normal limits.  Degenerative changes of the visualized thoracolumbar spine.  IMPRESSION: Suspected right lower lobe pneumonia.   Original Report Authenticated By: Charline Bills, M.D.      1. Community acquired pneumonia   2. COPD (chronic obstructive pulmonary disease)       MDM  Chest x-ray shows right lower lobe pneumonia. Rx IV Rocephin, IV Zithromax, breathing treatment with albuterol and Atrovent, oxygen.  Admit        Donnetta Hutching, MD 11/17/11 [phone]

## 2011-11-17 NOTE — ED Notes (Signed)
Pt reports that she has been having fever for the past few days. Reports sob and hx of copd. States she is supposed to be getting oxygen at home, but it has not come in yet. States she is having pain all over.

## 2011-11-17 NOTE — ED Notes (Signed)
Respiratory notified of need for breathing treatment.

## 2011-11-17 NOTE — Telephone Encounter (Signed)
Attempted to call patient at home nunber-magic jack number and unable to get calls from our office; therefore I called the mobile number in chart-LMTCB.

## 2011-11-17 NOTE — Telephone Encounter (Signed)
Called home # - could not reach pt as this is magic jack phone [email or web address] on cell #

## 2011-11-17 NOTE — ED Notes (Signed)
Dr. Adriana Simas states pt does not need blood cultures before abx are given.

## 2011-11-17 NOTE — ED Notes (Signed)
RT at bedside for breathing treatment.  

## 2011-11-18 DIAGNOSIS — A419 Sepsis, unspecified organism: Principal | ICD-10-CM

## 2011-11-18 DIAGNOSIS — J449 Chronic obstructive pulmonary disease, unspecified: Secondary | ICD-10-CM

## 2011-11-18 DIAGNOSIS — J189 Pneumonia, unspecified organism: Secondary | ICD-10-CM

## 2011-11-18 DIAGNOSIS — R0902 Hypoxemia: Secondary | ICD-10-CM

## 2011-11-18 DIAGNOSIS — J9610 Chronic respiratory failure, unspecified whether with hypoxia or hypercapnia: Secondary | ICD-10-CM

## 2011-11-18 LAB — CBC
HCT: 37.9 % (ref 36.0–46.0)
MCHC: 33 g/dL (ref 30.0–36.0)
MCV: 97.9 fL (ref 78.0–100.0)
Platelets: 252 10*3/uL (ref 150–400)
RDW: 14.2 % (ref 11.5–15.5)
WBC: 17.4 10*3/uL — ABNORMAL HIGH (ref 4.0–10.5)

## 2011-11-18 LAB — BASIC METABOLIC PANEL
BUN: 6 mg/dL (ref 6–23)
CO2: 30 mEq/L (ref 19–32)
Calcium: 8.4 mg/dL (ref 8.4–10.5)
Creatinine, Ser: 0.49 mg/dL — ABNORMAL LOW (ref 0.50–1.10)

## 2011-11-18 MED ORDER — HYDROCODONE-ACETAMINOPHEN 5-325 MG PO TABS
1.0000 | ORAL_TABLET | Freq: Four times a day (QID) | ORAL | Status: DC | PRN
  Administered 2011-11-18 (×3): 1 via ORAL
  Filled 2011-11-18 (×3): qty 1

## 2011-11-18 MED ORDER — INFLUENZA VIRUS VACC SPLIT PF IM SUSP
0.5000 mL | Freq: Once | INTRAMUSCULAR | Status: AC
  Administered 2011-11-18: 0.5 mL via INTRAMUSCULAR
  Filled 2011-11-18: qty 0.5

## 2011-11-18 NOTE — Evaluation (Signed)
Physical Therapy Evaluation Patient Details Name: Alexa Stephens MRN: [id number] DOB: [birth year] Today's Date: 11/18/2011 Time: 4098-1191 PT Time Calculation (min): 21 min  PT Assessment / Plan / Recommendation Clinical Impression  Pt admitted for CAP, acute respiratory failure and COPD exacerbation.  Pt sats in 70s and 80s on entering room however 97% on 5L when checked with different pulse ox.  Pt stayed at 95% with 4L at rest so started ambulation on 4L and sats dropped to 84% upon sitting down in recliner so briefly increased to 6L to recover.  Pt would benefit from acute PT services in order to improve independence with transfers and ambulation and increase activity tolerance while monitoring SaO2 to prepare for safe d/c home.    PT Assessment  Patient needs continued PT services    Follow Up Recommendations  Home health PT    Barriers to Discharge        Equipment Recommendations  Rolling walker with 5" wheels    Recommendations for Other Services     Frequency Min 3X/week    Precautions / Restrictions Precautions Precautions: Fall Precaution Comments: monitor sats   Pertinent Vitals/Pain No pain, see sats below      Mobility  Transfers Transfers: Stand to Sit;Sit to Stand Sit to Stand: 4: Min guard;From chair/3-in-1;With upper extremity assist Stand to Sit: 4: Min guard;To chair/3-in-1;With upper extremity assist Details for Transfer Assistance: verbal cue for hand placement Ambulation/Gait Ambulation/Gait Assistance: 4: Min guard Ambulation Distance (Feet): 160 Feet Assistive device: None Ambulation/Gait Assistance Details: occasional grabbing for rail, slightly unsteady, agreeable to try RW next visit and also use for conserving energy, verbal cues for taking rest breaks and performing pursed lip breathing, ambulated on 4L [place] and SaO2 88-95% until sitting in recliner and reported dyspnea then dropped to 84% and not rising well with pursed lip breathing so increased to  6L and SaO2 improved to 94% then placed back on 5L [place]. Gait Pattern: Step-through pattern;Shuffle;Decreased stride length    Exercises     PT Diagnosis: Difficulty walking  PT Problem List: Decreased activity tolerance;Decreased mobility;Decreased knowledge of use of DME;Cardiopulmonary status limiting activity PT Treatment Interventions: Gait training;DME instruction;Functional mobility training;Therapeutic activities;Therapeutic exercise;Patient/family education   PT Goals Acute Rehab PT Goals PT Goal Formulation: With patient Time For Goal Achievement: 11/25/11 Potential to Achieve Goals: Good Pt will go Sit to Stand: with modified independence PT Goal: Sit to Stand - Progress: Goal set today Pt will go Stand to Sit: with modified independence PT Goal: Stand to Sit - Progress: Goal set today Pt will Ambulate: >150 feet;with modified independence;with least restrictive assistive device;Other (comment) (SaO2 >92%) PT Goal: Ambulate - Progress: Goal set today  Visit Information  Last PT Received On: 11/18/11 Assistance Needed: +1    Subjective Data  Subjective: Oh there's my coffee I guess she brought it in when I was playing with the phone.   Prior Functioning  Home Living Lives With: Son Available Help at Discharge: Family Type of Home: House Home Access: Stairs to enter [employer] of Steps: 1 Entrance Stairs-Rails: None Home Layout: Two level;Able to live on main level with bedroom/bathroom Bathroom Shower/Tub: Engineer, manufacturing systems: Standard Home Adaptive Equipment: None Prior Function Level of Independence: Independent Able to Take Stairs?: Yes Driving: Yes Vocation: Unemployed Communication Communication: No difficulties Dominant Hand: Right    Cognition  Overall Cognitive Status: Appears within functional limits for tasks assessed/performed Arousal/Alertness: Awake/alert Orientation Level: Appears intact for tasks assessed Behavior  During  Session: [facility] for tasks performed    Extremity/Trunk Assessment Right Lower Extremity Assessment RLE ROM/Strength/Tone: Within functional levels Left Lower Extremity Assessment LLE ROM/Strength/Tone: Within functional levels   Balance    End of Session PT - End of Session Equipment Utilized During Treatment: Oxygen Activity Tolerance: Patient limited by fatigue Patient left: in chair;with call bell/phone within reach Nurse Communication: Other (comment)  GP     Maida Sale E 11/18/2011, 4:03 PM Pager: [phone]

## 2011-11-18 NOTE — ED Notes (Addendum)
MD and ICU charge RN at bedside. They determined pt not a stepdown pt. Waiting for new bed assignment orders

## 2011-11-18 NOTE — Progress Notes (Signed)
Notified [facility]ables, in need of continuous pulse ox.

## 2011-11-18 NOTE — Evaluation (Signed)
Occupational Therapy Evaluation Patient Details Name: Alexa Stephens MRN: [id number] DOB: 09/22/1950 Today's Date: 11/18/2011 Time: 1478-2956 OT Time Calculation (min): 29 min  OT Assessment / Plan / Recommendation Clinical Impression  Pt is a 61 yo female who presents with PNA. It is likely that pt will go home with supplemental O2. Pt desaturates with even a minimal amount of activity on 4L. Discussed several energy conservation strategies. Skilled OT recommended to maximize independence with BADLs to mod I level in prep for safe d/c home with prn A from family and HHOT.    OT Assessment  Patient needs continued OT Services    Follow Up Recommendations  Home health OT    Barriers to Discharge      Equipment Recommendations  None recommended by OT    Recommendations for Other Services    Frequency  Min 2X/week    Precautions / Restrictions Precautions Precautions: Fall Precaution Comments: desats with activity even on 4 L Restrictions Weight Bearing Restrictions: No   Pertinent Vitals/Pain Initially pt was at 89% at rest on 4 L of O2. At end of session, pt dropped to low 80s. Educated on PLB. O2 increased to 90% on 4L    ADL  Grooming: Performed;Wash/dry hands;Supervision/safety Where Assessed - Grooming: Supported standing Upper Body Bathing: Simulated;Set up Where Assessed - Upper Body Bathing: Unsupported sitting Lower Body Bathing: Simulated;Min guard Where Assessed - Lower Body Bathing: Supported sit to stand Upper Body Dressing: Simulated;Set up Where Assessed - Upper Body Dressing: Unsupported sitting Lower Body Dressing: Performed;Set up Where Assessed - Lower Body Dressing: Unsupported sitting;Other (comment) (donned/doffed socks.) Toilet Transfer: Research scientist (life sciences) Method: Sit to Barista: Regular height toilet Toileting - Clothing Manipulation and Hygiene: Performed;Supervision/safety Where Assessed - Toileting  Clothing Manipulation and Hygiene: Sit to stand from 3-in-1 or toilet Transfers/Ambulation Related to ADLs: Pt ambulated to the bathroom pushing IV pole with minguard A. Unsteady. Would benefit from the use of an AD. ADL Comments: Pt fatigues very quickly. 2/4 DOE. O2 desats to low 80s even with minimal acivity on 4 L    OT Diagnosis: Generalized weakness  OT Problem List: Decreased strength;Decreased activity tolerance;Decreased knowledge of use of DME or AE;Cardiopulmonary status limiting activity OT Treatment Interventions: Self-care/ADL training;Therapeutic activities;DME and/or AE instruction;Patient/family education   OT Goals Acute Rehab OT Goals OT Goal Formulation: With patient Time For Goal Achievement: 12/02/11 Potential to Achieve Goals: Good ADL Goals Pt Will Perform Grooming: with modified independence;Standing at sink ADL Goal: Grooming - Progress: Goal set today Pt Will Transfer to Toilet: with modified independence;Regular height toilet;Ambulation ADL Goal: Toilet Transfer - Progress: Goal set today Pt Will Perform Toileting - Clothing Manipulation: with modified independence;Standing;Sitting on 3-in-1 or toilet ADL Goal: Toileting - Clothing Manipulation - Progress: Goal set today Pt Will Perform Toileting - Hygiene: with modified independence;Sit to stand from 3-in-1/toilet ADL Goal: Toileting - Hygiene - Progress: Goal set today Pt Will Perform Tub/Shower Transfer: Tub transfer;Ambulation;with supervision ADL Goal: Tub/Shower Transfer - Progress: Goal set today Additional ADL Goal #1: Pt will perform all aspects of bathing and dressing including ADL item retrieval at mod I level. ADL Goal: Additional Goal #1 - Progress: Goal set today Miscellaneous OT Goals Miscellaneous OT Goal #1: Pt will incorporate 3 energy conservation strategies into selfcare routine with min VCs. OT Goal: Miscellaneous Goal #1 - Progress: Goal set today  Visit Information  Last OT Received  On: 11/18/11 Assistance Needed: +1    Subjective Data  Subjective:  My son's wife will be with me all day. Patient Stated Goal: I'd like to garden again.   Prior Functioning  Vision/Perception  Home Living Lives With: Son Available Help at Discharge: Family Type of Home: House Home Access: Stairs to enter Home Layout: Two level;Able to live on main level with bedroom/bathroom Bathroom Shower/Tub: Engineer, manufacturing systems: Standard Prior Function Level of Independence: Independent Able to Take Stairs?: Yes Driving: Yes Vocation: Unemployed Communication Communication: No difficulties Dominant Hand: Right      Cognition  Overall Cognitive Status: Appears within functional limits for tasks assessed/performed Arousal/Alertness: Awake/alert Orientation Level: Appears intact for tasks assessed Behavior During Session: [facility] for tasks performed    Extremity/Trunk Assessment Right Upper Extremity Assessment RUE ROM/Strength/Tone: Deficits RUE ROM/Strength/Tone Deficits: Pt has B hand deformities 2* injuries sustained from her childbirth. Missing several IP joints from both hands. Demo's generalized weakness throughout. Left Upper Extremity Assessment LUE ROM/Strength/Tone: Deficits LUE ROM/Strength/Tone Deficits: Pt has B hand deformities 2* injuries sustained from her childbirth. Missing several IP joints from both hands. Demo's generalized weakness throughout.   Mobility  Shoulder Instructions  Bed Mobility Bed Mobility: Supine to Sit Supine to Sit: 6: Modified independent (Device/Increase time);With rails;HOB elevated Transfers Transfers: Sit to Stand;Stand to Sit Sit to Stand: 4: Min guard;5: Supervision;With upper extremity assist;From bed;From toilet Stand to Sit: 5: Supervision;With upper extremity assist;With armrests;To chair/3-in-1 Details for Transfer Assistance: Min VCs for hand placement and safety.       Exercise     Balance Static Sitting  Balance Static Sitting - Balance Support: No upper extremity supported;Feet supported Static Sitting - Level of Assistance: 7: Independent Static Standing Balance Static Standing - Balance Support: No upper extremity supported;During functional activity Static Standing - Level of Assistance: 5: Stand by assistance   End of Session OT - End of Session Activity Tolerance: Patient limited by fatigue Patient left: in chair;with call bell/phone within reach  GO     Ozil Stettler A OTR/L [phone] 11/18/2011, 9:57 AM

## 2011-11-18 NOTE — Progress Notes (Addendum)
Cm spoke with patient concerning dc planning. PT suggest HHPT. HHRN per COPD protocol. Per pt choice [facility] to provide [facility] services upon discharge. [facility] notified of new referral. . Pt states son lives with her to assist in home care but is not available at all times. Cm offered [facility] services but pt declined. Per RN pt to dc home with home 02. Rn notified of need of documentation and MD order. Pt request Rw. [facility] notified of DME referral.MD made aware of need for orders for RN,PT,SW,RW, & 02 upon dc if applicable. Pt states interested in [facility] program. Pt provided with information  Concerning DSS & Jovita Kussmaul. Cm left message with financial counselor for further assistance.   Leonie Green [phone]

## 2011-11-18 NOTE — Progress Notes (Signed)
Patient resting.  95% on 4L [place]

## 2011-11-18 NOTE — Progress Notes (Addendum)
[facility] PROGRESS NOTE  Alexa Stephens ZOX:[id number] DOB: 12/07/1950 DOA: 11/17/2011 PCP: No primary provider on file.  Assessment/Plan: Principal Problem:  *CAP (community acquired pneumonia) Active Problems:  Acute-on-chronic respiratory failure  COPD exacerbation  Sepsis  Hypoxia  CAP (community acquired pneumonia) with hypoxia  -Continue current antibiotics, follow cultures. Active Problems:  Acute-on-chronic respiratory failure -Treating pneumonia as above, see below for treatment of COPD exacerbation, supplemental oxygen and follow.  COPD exacerbation  -Continue bronchodilators  and steroids Sepsis  Hypertension -Continue Monitoring off antihypertensives for now, and resume if blood pressures remaining stable Hyponatremia -Improving with hydration, follow and recheck    Code Status: full Family Communication:  Disposition Plan: to home when stable   Brief narrative: Pt is a 61 y.o. Female with h/o COPD admitted with worsening SOB, cough, fever, chills x1wk.S tates prescribed home O2 outpt but has not arrived yet. In the ED, Lab work up showed a WBC 18k, s.tach in the 120s, T 99.2  CXR demonstrated a RLL PNA. She Was admitted for further evaluation and management.   Consultants:  None  Procedures:  None  Antibiotics:  Ceftin and Zithromax started on 9/20  HPI/Subjective: States breathing better this a.m. denies cough  Objective: Filed Vitals:   11/18/11 0150 11/18/11 0235 11/18/11 0632 11/18/11 0739  BP: 136/73  124/87   Pulse: 108  102   Temp: 99.4 F (37.4 C)  98.8 F (37.1 C)   TempSrc:   Oral   Resp: 20  20   Height: 5\' 3"  (1.6 m)     Weight: 72.6 kg (160 lb 0.9 oz)     SpO2: 93% 91% 92% 91%    Intake/Output Summary (Last 24 hours) at 11/18/11 1129 Last data filed at 11/18/11 1030  Gross per 24 hour  Intake   1700 ml  Output    400 ml  Net   1300 ml   Filed Weights   11/18/11 0150  Weight: 72.6 kg (160 lb 0.9 oz)     Exam:   General: Alert and oriented x3, in no respiratory distress with vesicular oxygen on  Cardiovascular: Mildly tachycardic, regular rhythm  Respiratory: Decreased breath sounds at bases, no crackles or wheezes  Abdomen: Soft, bowel sounds present nontender nondistended no organomegaly or masses palpable  Extremities: No cyanosis and no edema  Data Reviewed: Basic Metabolic Panel:  Lab 11/18/11 8119 11/17/11 2040  NA 132* 128*  K 4.0 4.0  CL 95* 90*  CO2 30 28  GLUCOSE 174* 139*  BUN 6 6  CREATININE 0.49* 0.55  CALCIUM 8.4 9.1  MG -- --  PHOS -- --   Liver Function Tests: No results found for this basename: AST:5,ALT:5,ALKPHOS:5,BILITOT:5,PROT:5,ALBUMIN:5 in the last 168 hours No results found for this basename: LIPASE:5,AMYLASE:5 in the last 168 hours No results found for this basename: AMMONIA:5 in the last 168 hours CBC:  Lab 11/18/11 0903 11/17/11 2040  WBC 17.4* 18.6*  NEUTROABS -- --  HGB 12.5 13.4  HCT 37.9 39.0  MCV 97.9 96.3  PLT 252 243   Cardiac Enzymes:  Lab 11/17/11 2330  CKTOTAL --  CKMB --  CKMBINDEX --  TROPONINI <0.30   BNP (last 3 results) No results found for this basename: PROBNP:3 in the last 8760 hours CBG: No results found for this basename: GLUCAP:5 in the last 168 hours  No results found for this or any previous visit (from the past 240 hour(s)).   Studies: Dg Chest 2 View (if Patient Has Fever  And/or Copd)  11/17/2011  *RADIOLOGY REPORT*  Clinical Data: SOB, cough  CHEST - 2 VIEW  Comparison: 11/01/2011  Findings: Patchy right lower lobe opacity, suspicious for pneumonia.  Underlying chronic interstitial markings/emphysematous changes. No pleural effusion or pneumothorax.  Cardiomediastinal silhouette is within normal limits.  Degenerative changes of the visualized thoracolumbar spine.  IMPRESSION: Suspected right lower lobe pneumonia.   Original Report Authenticated By: Charline Bills, M.D.     Scheduled Meds:   .  albuterol  2.5 mg Nebulization Q6H  . albuterol  5 mg Nebulization Once  . albuterol  5 mg Nebulization Once  . azithromycin  500 mg Intravenous Once  . azithromycin  500 mg Intravenous Q24H  . cefTRIAXone (ROCEPHIN)  IV  1 g Intravenous Once  . cefTRIAXone (ROCEPHIN)  IV  1 g Intravenous Q24H  . doxepin  25 mg Oral Daily  . Fluticasone-Salmeterol  1 puff Inhalation Q12H  . heparin  5,000 Units Subcutaneous Q8H  . influenza  inactive virus vaccine  0.5 mL Intramuscular Once  . ipratropium  0.5 mg Nebulization Once  . loratadine  10 mg Oral Daily  . methylPREDNISolone (SOLU-MEDROL) injection  40 mg Intravenous Q6H  . simvastatin  20 mg Oral QPM  . sodium chloride  1,000 mL Intravenous Once  . venlafaxine XR  225 mg Oral Daily  . DISCONTD: venlafaxine XR  150 mg Oral Daily  . DISCONTD: venlafaxine XR  75 mg Oral Daily   Continuous Infusions:   . sodium chloride 150 mL/hr at 11/18/11 0626  . DISCONTD: sodium chloride      Principal Problem:  *CAP (community acquired pneumonia) Active Problems:  Acute-on-chronic respiratory failure  COPD exacerbation  Sepsis  Hypoxia    Time spent:    [facility] C  [facility] Pager [phone]. If 8PM-8AM, please contact night-coverage at [email or web address], password [facility] 11/18/2011, 11:29 AM  LOS: 1 day

## 2011-11-18 NOTE — Progress Notes (Signed)
Patient continuing to have periods of oxygen desaturation.  High 70's to low 80's.  Encouraging patient to cough and deep breath.

## 2011-11-18 NOTE — Progress Notes (Signed)
Patient sat o2 = 91% on 4L.  Drops in the high 70's, low 80's with activity.  HR in the 110's. On continuous pulse ox and IV fluids infusing at 150/hr.

## 2011-11-18 NOTE — Progress Notes (Addendum)
SATURATION QUALIFICATIONS:  Patient Saturations on Room Air at Rest = 76%  Patient Saturations on Room Air while Ambulating = 73%  Patient Saturations on 4L Liters of oxygen while Ambulating = 83%  Statement of medical necessity for home oxygen:  Oxygen desaturation at rest and with activity

## 2011-11-19 LAB — BASIC METABOLIC PANEL
CO2: 30 mEq/L (ref 19–32)
Calcium: 8.1 mg/dL — ABNORMAL LOW (ref 8.4–10.5)
Creatinine, Ser: 0.46 mg/dL — ABNORMAL LOW (ref 0.50–1.10)
GFR calc Af Amer: >90 mL/min (ref >90)
GFR calc non Af Amer: >90 mL/min (ref >90)
Sodium: 136 mEq/L (ref 135–145)

## 2011-11-19 LAB — CBC
MCH: 33.2 pg (ref 26.0–34.0)
MCHC: 32.8 g/dL (ref 30.0–36.0)
MCV: 101.4 fL — ABNORMAL HIGH (ref 78.0–100.0)
Platelets: 220 10*3/uL (ref 150–400)
RBC: 3.52 MIL/uL — ABNORMAL LOW (ref 3.87–5.11)
RDW: 14.7 % (ref 11.5–15.5)

## 2011-11-19 LAB — STREP PNEUMONIAE URINARY ANTIGEN: Strep Pneumo Urinary Antigen: NEGATIVE

## 2011-11-19 LAB — EXPECTORATED SPUTUM ASSESSMENT W GRAM STAIN, RFLX TO RESP C

## 2011-11-19 MED ORDER — METHYLPREDNISOLONE SODIUM SUCC 40 MG IJ SOLR
40.0000 mg | Freq: Two times a day (BID) | INTRAMUSCULAR | Status: DC
  Administered 2011-11-19 – 2011-11-23 (×8): 40 mg via INTRAVENOUS
  Filled 2011-11-19 (×9): qty 1

## 2011-11-19 NOTE — Progress Notes (Signed)
99% while receiving breathing treatment.  Patient states she feels better than yesterday although her oxygen levels continue to fluctuate between high 70's and high 80's.  O2 in the 90's while sleeping.

## 2011-11-19 NOTE — Progress Notes (Addendum)
Patient standing = 93% on 4L.  When returning to a sitting position dropped to 84% on 4L.  Patient states that she starts to feel tired and wants to take a rest.

## 2011-11-19 NOTE — Progress Notes (Signed)
I agree with the following treatment note after reviewing documentation.   Johnston, Ladarius Seubert Brynn   OTR/L Pager: [phone] Office: [phone] .   

## 2011-11-19 NOTE — Progress Notes (Signed)
[facility] PROGRESS NOTE  Alexa Stephens ZOX:[id number] DOB: 15-Jan-1951 DOA: 11/17/2011 PCP: No primary provider on file.  Assessment/Plan: Principal Problem:  *CAP (community acquired pneumonia) Active Problems:  Acute-on-chronic respiratory failure  COPD exacerbation  Sepsis  Hypoxia  CAP (community acquired pneumonia) with hypoxia  -Continue current antibiotics, cultures with no growth to date. Active Problems:  Acute-on-chronic respiratory failure -Treating pneumonia as above, see below for treatment of COPD exacerbation, supplemental oxygen and follow.  COPD exacerbation  -Continue bronchodilators taper Solu-Medrol -Mobilize, obtain O2 sats on room air in a.m. Sepsis  -Proving clinically, secondary to #1 Hypertension -Continue Monitoring off antihypertensives for now, and resume if blood pressures remaining stable Hyponatremia -Resolved with hydration.    Code Status: full Family Communication:  Disposition Plan: to home when stable   Brief narrative: Pt is a 61 y.o. Female with h/o COPD admitted with worsening SOB, cough, fever, chills x1wk.S tates prescribed home O2 outpt but has not arrived yet. In the ED, Lab work up showed a WBC 18k, s.tach in the 120s, T 99.2  CXR demonstrated a RLL PNA. She Was admitted for further evaluation and management.   Consultants:  None  Procedures:  None  Antibiotics:  Ceftin and Zithromax started on 9/20  HPI/Subjective: Denies any new complaints, but per nursing did not sleep most of last PM  Objective: Filed Vitals:   11/19/11 0507 11/19/11 0512 11/19/11 0842 11/19/11 1203  BP: 116/90     Pulse: 92     Temp: 97.9 F (36.6 C)     TempSrc: Oral     Resp: 20     Height:      Weight:      SpO2: 90% 97% 93% 97%    Intake/Output Summary (Last 24 hours) at 11/19/11 1404 Last data filed at 11/19/11 8119  Gross per 24 hour  Intake   4310 ml  Output    850 ml  Net   3460 ml   Filed Weights   11/18/11 0150    Weight: 72.6 kg (160 lb 0.9 oz)    Exam:   General: Alert and oriented x3, in no respiratory distress with [place] oxygen on  Cardiovascular: RRR, nl S1S2  Respiratory: Decreased breath sounds at bases, few scattered wheezes, no crackles  Abdomen: Soft, bowel sounds present nontender nondistended no organomegaly or masses palpable  Extremities: No cyanosis and no edema  Data Reviewed: Basic Metabolic Panel:  Lab 11/19/11 1478 11/18/11 0903 11/17/11 2040  NA 136 132* 128*  K 4.6 4.0 4.0  CL 101 95* 90*  CO2 30 30 28   GLUCOSE 213* 174* 139*  BUN 6 6 6   CREATININE 0.46* 0.49* 0.55  CALCIUM 8.1* 8.4 9.1  MG -- -- --  PHOS -- -- --   Liver Function Tests: No results found for this basename: AST:5,ALT:5,ALKPHOS:5,BILITOT:5,PROT:5,ALBUMIN:5 in the last 168 hours No results found for this basename: LIPASE:5,AMYLASE:5 in the last 168 hours No results found for this basename: AMMONIA:5 in the last 168 hours CBC:  Lab 11/19/11 0420 11/18/11 0903 11/17/11 2040  WBC 12.2* 17.4* 18.6*  NEUTROABS -- -- --  HGB 11.7* 12.5 13.4  HCT 35.7* 37.9 39.0  MCV 101.4* 97.9 96.3  PLT 220 252 243   Cardiac Enzymes:  Lab 11/17/11 2330  CKTOTAL --  CKMB --  CKMBINDEX --  TROPONINI <0.30   BNP (last 3 results) No results found for this basename: PROBNP:3 in the last 8760 hours CBG: No results found for this basename: GLUCAP:5  in the last 168 hours  Recent Results (from the past 240 hour(s))  CULTURE, BLOOD (ROUTINE X 2)     Status: Normal (Preliminary result)   Collection Time   11/18/11  5:39 AM      Component Value Range Status Comment   Specimen Description BLOOD RIGHT AC   Final    Special Requests BOTTLES DRAWN AEROBIC AND ANAEROBIC 5 CC EACH   Final    Culture  Setup Time 11/18/2011 11:36   Final    Culture     Final    Value:        BLOOD CULTURE RECEIVED NO GROWTH TO DATE CULTURE WILL BE HELD FOR 5 DAYS BEFORE ISSUING A FINAL NEGATIVE REPORT   Report Status PENDING    Incomplete   CULTURE, BLOOD (ROUTINE X 2)     Status: Normal (Preliminary result)   Collection Time   11/18/11  5:46 AM      Component Value Range Status Comment   Specimen Description BLOOD LEFT HAND   Final    Special Requests BOTTLES DRAWN AEROBIC AND ANAEROBIC 1.5 CC EACH   Final    Culture  Setup Time 11/18/2011 11:36   Final    Culture     Final    Value:        BLOOD CULTURE RECEIVED NO GROWTH TO DATE CULTURE WILL BE HELD FOR 5 DAYS BEFORE ISSUING A FINAL NEGATIVE REPORT   Report Status PENDING   Incomplete   CULTURE, EXPECTORATED SPUTUM-ASSESSMENT     Status: Normal   Collection Time   11/19/11 12:19 AM      Component Value Range Status Comment   Specimen Description SPUTUM   Final    Special Requests NONE   Final    Sputum evaluation     Final    Value: THIS SPECIMEN IS ACCEPTABLE. RESPIRATORY CULTURE REPORT TO FOLLOW.   Report Status 11/19/2011 FINAL   Final      Studies: Dg Chest 2 View (if Patient Has Fever And/or Copd)  11/17/2011  *RADIOLOGY REPORT*  Clinical Data: SOB, cough  CHEST - 2 VIEW  Comparison: 11/01/2011  Findings: Patchy right lower lobe opacity, suspicious for pneumonia.  Underlying chronic interstitial markings/emphysematous changes. No pleural effusion or pneumothorax.  Cardiomediastinal silhouette is within normal limits.  Degenerative changes of the visualized thoracolumbar spine.  IMPRESSION: Suspected right lower lobe pneumonia.   Original Report Authenticated By: Charline Bills, M.D.     Scheduled Meds:    . albuterol  2.5 mg Nebulization Q6H  . azithromycin  500 mg Intravenous Q24H  . cefTRIAXone (ROCEPHIN)  IV  1 g Intravenous Q24H  . doxepin  25 mg Oral Daily  . Fluticasone-Salmeterol  1 puff Inhalation Q12H  . heparin  5,000 Units Subcutaneous Q8H  . influenza  inactive virus vaccine  0.5 mL Intramuscular Once  . loratadine  10 mg Oral Daily  . methylPREDNISolone (SOLU-MEDROL) injection  40 mg Intravenous Q6H  . simvastatin  20 mg Oral QPM    . venlafaxine XR  225 mg Oral Daily   Continuous Infusions:    . sodium chloride 150 mL/hr at 11/19/11 0957    Principal Problem:  *CAP (community acquired pneumonia) Active Problems:  Acute-on-chronic respiratory failure  COPD exacerbation  Sepsis  Hypoxia    Time spent:    Kela Millin  [facility] Pager [phone]. If 8PM-8AM, please contact night-coverage at [email or web address], password [facility] 11/19/2011, 2:04 PM  LOS: 2 days

## 2011-11-19 NOTE — Progress Notes (Signed)
Occupational Therapy Treatment Patient Details Name: Alexa Stephens MRN: [id number] DOB: 1950-03-06 Today's Date: 11/19/2011 Time: 0952-1000 OT Time Calculation (min): 8 min  OT Assessment / Plan / Recommendation Comments on Treatment Session Pt. fatigued this morning, not wanting to get out of bed. Educated pt at bedside on energy conservation techniques.    Follow Up Recommendations  Home health OT    Barriers to Discharge       Equipment Recommendations  Rolling walker with 5" wheels    Recommendations for Other Services    Frequency Min 2X/week   Plan Discharge plan remains appropriate    Precautions / Restrictions Precautions Precautions: Fall Restrictions Weight Bearing Restrictions: No   Pertinent Vitals/Pain Pt. very fatigued this morning    ADL  ADL Comments: Pt. fatigued this morning both pt. and the nurse reported pt. got no sleep  last night. Pt. did not want to get out of bed. Educated on energy conservation and asked patient to recall what she could remember from previous OT session. Pt able to recall to bath in warm not hot water, not to use scented sprays, and to to deep breath during ADL's. Pt kept her eyes closed throughout session. Gave patient energy conservation handout to read when she is more alert.      OT Diagnosis:    OT Problem List:   OT Treatment Interventions:     OT Goals Acute Rehab OT Goals OT Goal Formulation: With patient Time For Goal Achievement: 12/02/11 Potential to Achieve Goals: Good ADL Goals Pt Will Perform Grooming: with modified independence;Standing at sink Pt Will Transfer to Toilet: with modified independence;Regular height toilet;Ambulation Pt Will Perform Toileting - Clothing Manipulation: with modified independence;Standing;Sitting on 3-in-1 or toilet Pt Will Perform Toileting - Hygiene: with modified independence;Sit to stand from 3-in-1/toilet Pt Will Perform Tub/Shower Transfer: Tub transfer;Ambulation;with  supervision Additional ADL Goal #1: Pt will perform all aspects of bathing and dressing including ADL item retrieval at mod I level. Miscellaneous OT Goals Miscellaneous OT Goal #1: Pt will incorporate 3 energy conservation strategies into selfcare routine with min VCs. OT Goal: Miscellaneous Goal #1 - Progress: Progressing toward goals  Visit Information  Last OT Received On: 11/19/11 Assistance Needed: +1    Subjective Data      Prior Functioning       Cognition  Overall Cognitive Status: Appears within functional limits for tasks assessed/performed Arousal/Alertness: Lethargic Orientation Level: Appears intact for tasks assessed Behavior During Session: Lethargic                       End of Session OT - End of Session Activity Tolerance: Patient limited by fatigue Patient left: in bed;with call bell/phone within reach;with nursing in room  GO     Cleora Fleet 11/19/2011, 10:09 AM

## 2011-11-19 NOTE — Progress Notes (Signed)
INITIAL ADULT NUTRITION ASSESSMENT Date: 11/19/2011   Time: 11:11 AM  Reason for Assessment: Consult, assessment of nutrition requirements and status  INTERVENTION: 1. Discussed strategies for weight loss with patient, including portion control and increasing fruit and vegetable consumption.  2. RD will monitor nutrition plan of care.   ASSESSMENT: Female 61 y.o.  Dx: CAP (community acquired pneumonia)  Hx:  Past Medical History  Diagnosis Date  . Anxiety disorder   . Asthma   . COPD (chronic obstructive pulmonary disease)     Not on home O2 yet but is prescribed.  . Headache   . Hyperlipemia   . Hypertension    Related Meds:     . albuterol  2.5 mg Nebulization Q6H  . azithromycin  500 mg Intravenous Q24H  . cefTRIAXone (ROCEPHIN)  IV  1 g Intravenous Q24H  . doxepin  25 mg Oral Daily  . Fluticasone-Salmeterol  1 puff Inhalation Q12H  . heparin  5,000 Units Subcutaneous Q8H  . influenza  inactive virus vaccine  0.5 mL Intramuscular Once  . loratadine  10 mg Oral Daily  . methylPREDNISolone (SOLU-MEDROL) injection  40 mg Intravenous Q6H  . simvastatin  20 mg Oral QPM  . venlafaxine XR  225 mg Oral Daily     Ht: 5\' 3"  (160 cm)  Wt: 160 lb 0.9 oz (72.6 kg)  Ideal Wt: 52.4 kg  % Ideal Wt: 139%  Usual Wt: 75.5 kg % Usual Wt: 96%  Body mass index is 28.35 kg/(m^2). Patient is overweight  Food/Nutrition Related Hx: Malnutrition screening tool score of 4 for unintentional weight loss and decreased appetite. Regular home diet.   Labs:  CMP     Component Value Date/Time   NA 136 11/19/2011 0420   K 4.6 11/19/2011 0420   CL 101 11/19/2011 0420   CO2 30 11/19/2011 0420   GLUCOSE 213* 11/19/2011 0420   BUN 6 11/19/2011 0420   CREATININE 0.46* 11/19/2011 0420   CALCIUM 8.1* 11/19/2011 0420   PROT 7.4 04/16/2008 2009   ALBUMIN 4.3 04/16/2008 2009   AST 15 04/16/2008 2009   ALT 13 04/16/2008 2009   ALKPHOS 126* 04/16/2008 2009   BILITOT 0.4 04/16/2008 2009   GFRNONAA >90  11/19/2011 0420   GFRAA >90 11/19/2011 0420     Intake/Output Summary (Last 24 hours) at 11/19/11 1112 Last data filed at 11/19/11 4098  Gross per 24 hour  Intake   4670 ml  Output    850 ml  Net   3820 ml    Diet Order: Sodium Restricted, 100% intake  Supplements/Tube Feeding: None  IVF:    sodium chloride Last Rate: 150 mL/hr at 11/19/11 0957    Estimated Nutritional Needs:   Kcal: 1500-1650 kcal Protein: 75-85 g Fluid: 2.5 L  Patient reports that she has a good appetite. She is eating 100% of meals. She states she had been trying to lose weight and has lost 7 pounds (4% of UBW) over the last 2 months. This is not significant for malnutrition and it is intentional. She states that her weight has stabilized and she desires to lose more weight to improve health.   NUTRITION DIAGNOSIS: -Food and nutrition related knowledge deficit (NB-1.1).  Status: Ongoing  RELATED TO: weight loss  AS EVIDENCE BY: patient states she wants guidance to lose more weight.   MONITORING/EVALUATION(Goals): 1. Patient will meet 90-100% of estimated nutrition needs during admission.  2. Patient will verbalize understanding of a general healthful diet to promote  a healthy weight.   Monitor: PO intake, weight, labs  EDUCATION NEEDS: -Education needs addressed, discussed strategies for weight loss  Linnell Fulling, RD, LDN Pager #: [phone] After-Hours Pager #: [phone]   DOCUMENTATION CODES Per approved criteria  -Not Applicable    Linnell Fulling [facility] 11/19/2011, 11:11 AM

## 2011-11-19 NOTE — Progress Notes (Signed)
Spoke with Bill in lab about the materials need to collect the Influenza Antigen. Billi stated that he would order the swabs from [facility] hospital. Billi also stated that the Respiratory virus antigen panel that was ordered was a send out lab, and it took 3-5 days for results. He ask me to see if this is what the MD attended to order. Daphane Shepherd, NP notified and said she would defer it to Dr. Suanne Marker.

## 2011-11-19 NOTE — Progress Notes (Signed)
Pt states she didn't sleep last night.  Appears to be very tired and sounds more congested.

## 2011-11-20 DIAGNOSIS — J9620 Acute and chronic respiratory failure, unspecified whether with hypoxia or hypercapnia: Secondary | ICD-10-CM

## 2011-11-20 LAB — CBC
HCT: 38.1 % (ref 36.0–46.0)
Hemoglobin: 11.8 g/dL — ABNORMAL LOW (ref 12.0–15.0)
MCH: 32.1 pg (ref 26.0–34.0)
MCHC: 31 g/dL (ref 30.0–36.0)
MCV: 103.5 fL — ABNORMAL HIGH (ref 78.0–100.0)
RDW: 14.7 % (ref 11.5–15.5)

## 2011-11-20 LAB — LEGIONELLA ANTIGEN, URINE

## 2011-11-20 MED ORDER — FUROSEMIDE 10 MG/ML IJ SOLN
60.0000 mg | Freq: Once | INTRAMUSCULAR | Status: AC
  Administered 2011-11-20: 60 mg via INTRAVENOUS
  Filled 2011-11-20: qty 6

## 2011-11-20 MED ORDER — METHYLPREDNISOLONE SODIUM SUCC 40 MG IJ SOLR
40.0000 mg | Freq: Once | INTRAMUSCULAR | Status: AC
  Administered 2011-11-20: 40 mg via INTRAVENOUS
  Filled 2011-11-20: qty 1

## 2011-11-20 MED ORDER — ALBUTEROL SULFATE (5 MG/ML) 0.5% IN NEBU
2.5000 mg | INHALATION_SOLUTION | RESPIRATORY_TRACT | Status: DC
  Administered 2011-11-20 – 2011-11-23 (×16): 2.5 mg via RESPIRATORY_TRACT
  Filled 2011-11-20 (×16): qty 0.5

## 2011-11-20 NOTE — Progress Notes (Signed)
Occupational Therapy Treatment Patient Details Name: Alexa Stephens MRN: [id number] DOB: Jul 26, 1950 Today's Date: 11/20/2011 Time: 6213-0865 OT Time Calculation (min): 25 min  OT Assessment / Plan / Recommendation Comments on Treatment Session Pt showing decreased safety with functional transfers and also not fully awake/alert during session. Eyes were intermittantly open. She reports nausea once up to chair. Informed nursing.     Follow Up Recommendations  Home health OT;Supervision/Assistance - 24 hour    Barriers to Discharge       Equipment Recommendations  Rolling walker with 5" wheels;3 in 1 bedside comode    Recommendations for Other Services    Frequency Min 2X/week   Plan Discharge plan remains appropriate    Precautions / Restrictions Precautions Precautions: Fall Precaution Comments: monitor sats Restrictions Weight Bearing Restrictions: No          ADL  Lower Body Dressing: Simulated;Maximal assistance;Other (comment) (with socks) Where Assessed - Lower Body Dressing: Unsupported sitting Toilet Transfer: Performed;Minimal assistance Toilet Transfer Method: Stand pivot Toilet Transfer Equipment: Bedside commode Toileting - Clothing Manipulation and Hygiene: Performed;Moderate assistance Where Assessed - Toileting Clothing Manipulation and Hygiene: Sit to stand from 3-in-1 or toilet ADL Comments: Pt with eyes closed at start of session in the bed. Pt answered that she is agreeable to get up to [facility] and chair but didnt open eyes. Transferred to EOB and eyes open but pt not following task to don socks. She had the sock in her hand and dropped it and still tried to don sock even though it was no longer in her hand. Repeated attempt after OT handed her the sock and again she dropped it and tried to still put it on with no sock in hand. When pt asked she states, "I dont think I am awake yet." Informed nursing of observations. Pt unsafe at times during session reaching forward  when sitting on BSC and trying to pull herself to standing with the recliner which made the commode move slightly. Informed pt to let OT know when she is ready to get up and to use the armrest of help her stand, not pull on the recliner. Pt desats to 83% on 3L to [facility] but then up to 93% with some PLB. Up to recliner after. Mod assist for periarea hygiene after BM as pt not awake and following commands well.     OT Diagnosis:    OT Problem List:   OT Treatment Interventions:     OT Goals ADL Goals ADL Goal: Toilet Transfer - Progress: Not progressing ADL Goal: Toileting - Clothing Manipulation - Progress: Not progressing ADL Goal: Toileting - Hygiene - Progress: Not progressing  Visit Information  Last OT Received On: 11/20/11 Assistance Needed: +1    Subjective Data  Subjective: I dont think I am awake yet Patient Stated Goal: agreeable up to commode and chair; none stated   Prior Functioning       Cognition  Overall Cognitive Status: Impaired Area of Impairment: Attention;Following commands;Safety/judgement Arousal/Alertness: Lethargic Behavior During Session: Lethargic Following Commands: Follows one step commands inconsistently Safety/Judgement: Decreased awareness of safety precautions;Decreased safety judgement for tasks assessed;Decreased awareness of need for assistance Cognition - Other Comments: Pt kept eyes closed throughout session.    Mobility  Shoulder Instructions Bed Mobility Bed Mobility: Supine to Sit Supine to Sit: 5: Supervision;HOB elevated;With rails Transfers Transfers: Sit to Stand;Stand to Sit Sit to Stand: 4: Min assist;With upper extremity assist;From chair/3-in-1 Stand to Sit: 4: Min assist;With upper extremity assist;To chair/3-in-1 Details  for Transfer Assistance: mod verbal cues for safety, hand placement and min to steady for safety          Balance     End of Session OT - End of Session Activity Tolerance: Other (comment)  (sleepy) Patient left: in chair;with call bell/phone within reach;with chair alarm set  GO     Lennox Laity [phone] 11/20/2011, 12:31 PM

## 2011-11-20 NOTE — Telephone Encounter (Signed)
ATC x 1 NA and unable to leave a msg. 

## 2011-11-20 NOTE — Progress Notes (Signed)
Physical Therapy Treatment Patient Details Name: Alexa Stephens MRN: [id number] DOB: 15-May-1950 Today's Date: 11/20/2011 Time: 4098-1191 PT Time Calculation (min): 10 min  PT Assessment / Plan / Recommendation Comments on Treatment Session  Pt reports "today is not a good day." Declined OOB with therapist. Agreeable to bed exercises. Pt appears confused at times.    Follow Up Recommendations  Home health PT;Supervision for mobility/OOB    Barriers to Discharge        Equipment Recommendations  Rolling walker with 5" wheels    Recommendations for Other Services    Frequency Min 3X/week   Plan Discharge plan needs to be updated    Precautions / Restrictions Precautions Precautions: Fall Restrictions Weight Bearing Restrictions: No   Pertinent Vitals/Pain 99% 5L O2, HR 110 bpm at rest    Mobility  Bed Mobility Bed Mobility: Supine to Sit Supine to Sit: 6: Modified independent (Device/Increase time);With rails Transfers Transfers: Not assessed Details for Transfer Assistance: Pt declined to attempt.  Ambulation/Gait Ambulation/Gait Assistance: Not tested (comment) Ambulation/Gait Assistance Details: Pt declined to attempt. Reported having difficulty getting back and forth to bathroom earllier.     Exercises General Exercises - Lower Extremity Ankle Circles/Pumps: AROM;10 reps;Supine;Both Quad Sets: AROM;Both;10 reps;Supine Heel Slides: AROM;Both;10 reps;Supine Hip ABduction/ADduction: AAROM;Both;10 reps;Supine   PT Diagnosis:    PT Problem List:   PT Treatment Interventions:     PT Goals Acute Rehab PT Goals Pt will go Sit to Stand: with modified independence PT Goal: Sit to Stand - Progress: Progressing toward goal  Visit Information  Last PT Received On: 11/20/11 Assistance Needed: +1    Subjective Data  Subjective: "Its not a good day. I'm going to sleep" Patient Stated Goal: None stated   Cognition  Overall Cognitive Status: Impaired Area of Impairment:  Attention Arousal/Alertness: Lethargic Behavior During Session: Anxious (intermittently) Cognition - Other Comments: Pt kept eyes closed throughout session.    Balance     End of Session PT - End of Session Equipment Utilized During Treatment: Oxygen Activity Tolerance: Patient limited by fatigue Patient left: in bed;with call bell/phone within reach   GP     Rebeca Alert [facility] 11/20/2011, 10:06 AM [phone]

## 2011-11-20 NOTE — Telephone Encounter (Signed)
ATC x 1 NA and unable to leave a msg.

## 2011-11-20 NOTE — Progress Notes (Addendum)
[facility] PROGRESS NOTE  Alexa Stephens ZOX:[id number] DOB: November 17, 1950 DOA: 11/17/2011 PCP: No primary provider on file.  Assessment/Plan: Principal Problem:  *CAP (community acquired pneumonia) Active Problems:  Acute-on-chronic respiratory failure  COPD exacerbation  Sepsis  Hypoxia  Principal problem CAP (community acquired pneumonia) with hypoxia  -Continue current antibiotics, cultures with no growth to date. Respiratory distress -Secondary to above possible element of volume overload -Will give a dose of Lasix, also a dose of Solu-Medrol DC IV fluids, follow. Active Problems:  Acute-on-chronic respiratory failure -Treating pneumonia as above, see below for treatment of COPD exacerbation, supplemental oxygen and follow.  COPD exacerbation  -Continue bronchodilators, extra dose of Lomotil given today secondary to his respiratory distress-see below -Continue supplemental oxygen Sepsis  -Proving clinically, secondary to #1 Hypertension -Continue Monitoring off antihypertensives for now, and resume if blood pressures remaining stable Hyponatremia -Resolved with hydration.  Code Status: full Family Communication:  Disposition Plan: to home when stable   Brief narrative: Pt is a 61 y.o. Female with h/o COPD admitted with worsening SOB, cough, fever, chills x1wk.S tates prescribed home O2 outpt but has not arrived yet. In the ED, Lab work up showed a WBC 18k, s.tach in the 120s, T 99.2  CXR demonstrated a RLL PNA. She Was admitted for further evaluation and management.   Consultants:  None  Procedures:  None  Antibiotics:  Ceftin and Zithromax started on 9/20  HPI/Subjective: Complaining of increased shortness of breath, denies cough, denies chest pain.  Objective: Filed Vitals:   11/20/11 0827 11/20/11 1200 11/20/11 1341 11/20/11 1412  BP:   150/80   Pulse:  112 106   Temp:   97.7 F (36.5 C)   TempSrc:   Axillary   Resp:   20   Height:        Weight:      SpO2: 100% 83% 98% 96%    Intake/Output Summary (Last 24 hours) at 11/20/11 1711 Last data filed at 11/20/11 1652  Gross per 24 hour  Intake 4372.5 ml  Output   2450 ml  Net 1922.5 ml   Filed Weights   11/18/11 0150  Weight: 72.6 kg (160 lb 0.9 oz)    Exam:   General: Alert and oriented x3, mildly tachypneic with accessory muscle use with [place] oxygen on  Cardiovascular: Tachycardic,Regular,, nl S1S2  Respiratory: Diminished air entry bilaterally, scattered wheezes, accessory muscle use  Abdomen: Soft, bowel sounds present nontender nondistended no organomegaly or masses palpable  Extremities: No cyanosis and no edema  Data Reviewed: Basic Metabolic Panel:  Lab 11/19/11 8119 11/18/11 0903 11/17/11 2040  NA 136 132* 128*  K 4.6 4.0 4.0  CL 101 95* 90*  CO2 30 30 28   GLUCOSE 213* 174* 139*  BUN 6 6 6   CREATININE 0.46* 0.49* 0.55  CALCIUM 8.1* 8.4 9.1  MG -- -- --  PHOS -- -- --   Liver Function Tests: No results found for this basename: AST:5,ALT:5,ALKPHOS:5,BILITOT:5,PROT:5,ALBUMIN:5 in the last 168 hours No results found for this basename: LIPASE:5,AMYLASE:5 in the last 168 hours No results found for this basename: AMMONIA:5 in the last 168 hours CBC:  Lab 11/20/11 0418 11/19/11 0420 11/18/11 0903 11/17/11 2040  WBC 15.4* 12.2* 17.4* 18.6*  NEUTROABS -- -- -- --  HGB 11.8* 11.7* 12.5 13.4  HCT 38.1 35.7* 37.9 39.0  MCV 103.5* 101.4* 97.9 96.3  PLT 370 220 252 243   Cardiac Enzymes:  Lab 11/17/11 2330  CKTOTAL --  CKMB --  CKMBINDEX --  TROPONINI <0.30   BNP (last 3 results) No results found for this basename: PROBNP:3 in the last 8760 hours CBG: No results found for this basename: GLUCAP:5 in the last 168 hours  Recent Results (from the past 240 hour(s))  CULTURE, BLOOD (ROUTINE X 2)     Status: Normal (Preliminary result)   Collection Time   11/18/11  5:39 AM      Component Value Range Status Comment   Specimen Description BLOOD  RIGHT AC   Final    Special Requests BOTTLES DRAWN AEROBIC AND ANAEROBIC 5 CC EACH   Final    Culture  Setup Time 11/18/2011 11:36   Final    Culture     Final    Value:        BLOOD CULTURE RECEIVED NO GROWTH TO DATE CULTURE WILL BE HELD FOR 5 DAYS BEFORE ISSUING A FINAL NEGATIVE REPORT   Report Status PENDING   Incomplete   CULTURE, BLOOD (ROUTINE X 2)     Status: Normal (Preliminary result)   Collection Time   11/18/11  5:46 AM      Component Value Range Status Comment   Specimen Description BLOOD LEFT HAND   Final    Special Requests BOTTLES DRAWN AEROBIC AND ANAEROBIC 1.5 CC EACH   Final    Culture  Setup Time 11/18/2011 11:36   Final    Culture     Final    Value:        BLOOD CULTURE RECEIVED NO GROWTH TO DATE CULTURE WILL BE HELD FOR 5 DAYS BEFORE ISSUING A FINAL NEGATIVE REPORT   Report Status PENDING   Incomplete   CULTURE, EXPECTORATED SPUTUM-ASSESSMENT     Status: Normal   Collection Time   11/19/11 12:19 AM      Component Value Range Status Comment   Specimen Description SPUTUM   Final    Special Requests NONE   Final    Sputum evaluation     Final    Value: THIS SPECIMEN IS ACCEPTABLE. RESPIRATORY CULTURE REPORT TO FOLLOW.   Report Status 11/19/2011 FINAL   Final   CULTURE, RESPIRATORY     Status: Normal (Preliminary result)   Collection Time   11/19/11 12:19 AM      Component Value Range Status Comment   Specimen Description SPUTUM   Final    Special Requests NONE   Final    Gram Stain PENDING   Incomplete    Culture Culture reincubated for better growth   Final    Report Status PENDING   Incomplete      Studies: No results found.  Scheduled Meds:    . albuterol  2.5 mg Nebulization Q4H  . azithromycin  500 mg Intravenous Q24H  . cefTRIAXone (ROCEPHIN)  IV  1 g Intravenous Q24H  . doxepin  25 mg Oral Daily  . Fluticasone-Salmeterol  1 puff Inhalation Q12H  . furosemide  60 mg Intravenous Once  . heparin  5,000 Units Subcutaneous Q8H  . loratadine  10 mg  Oral Daily  . methylPREDNISolone (SOLU-MEDROL) injection  40 mg Intravenous Q12H  . methylPREDNISolone (SOLU-MEDROL) injection  40 mg Intravenous Once  . simvastatin  20 mg Oral QPM  . venlafaxine XR  225 mg Oral Daily  . DISCONTD: albuterol  2.5 mg Nebulization Q6H   Continuous Infusions:    . DISCONTD: sodium chloride 150 mL/hr at 11/20/11 1243    Principal Problem:  *CAP (community acquired pneumonia) Active Problems:  Acute-on-chronic respiratory failure  COPD exacerbation  Sepsis  Hypoxia    Time spent: Critical care time one hour.    Kela Millin  [facility] Pager [phone]. If 8PM-8AM, please contact night-coverage at [email or web address], password [facility] 11/20/2011, 5:11 PM  LOS: 3 days

## 2011-11-21 DIAGNOSIS — J441 Chronic obstructive pulmonary disease with (acute) exacerbation: Secondary | ICD-10-CM

## 2011-11-21 LAB — CBC
MCH: 32 pg (ref 26.0–34.0)
MCHC: 31.1 g/dL (ref 30.0–36.0)
MCV: 102.8 fL — ABNORMAL HIGH (ref 78.0–100.0)
Platelets: 396 10*3/uL (ref 150–400)
RBC: 3.56 MIL/uL — ABNORMAL LOW (ref 3.87–5.11)

## 2011-11-21 LAB — BASIC METABOLIC PANEL
CO2: 39 mEq/L — ABNORMAL HIGH (ref 19–32)
Calcium: 8.6 mg/dL (ref 8.4–10.5)
Creatinine, Ser: 0.48 mg/dL — ABNORMAL LOW (ref 0.50–1.10)
GFR calc non Af Amer: >90 mL/min (ref >90)
Sodium: 140 mEq/L (ref 135–145)

## 2011-11-21 LAB — CULTURE, RESPIRATORY W GRAM STAIN

## 2011-11-21 MED ORDER — AZITHROMYCIN 500 MG PO TABS
500.0000 mg | ORAL_TABLET | Freq: Every day | ORAL | Status: DC
  Administered 2011-11-21: 500 mg via ORAL
  Filled 2011-11-21 (×2): qty 1

## 2011-11-21 MED ORDER — FUROSEMIDE 10 MG/ML IJ SOLN
40.0000 mg | Freq: Once | INTRAMUSCULAR | Status: AC
  Administered 2011-11-21: 40 mg via INTRAVENOUS
  Filled 2011-11-21: qty 4

## 2011-11-21 NOTE — Telephone Encounter (Signed)
Pt is in the hospital at [facility], room 1431. Will forward to VS as an FYI.

## 2011-11-21 NOTE — Progress Notes (Signed)
O2 sat drops to 70's on exertion with 3 L of O2, back to 92-95% with 3L at rest on high fowlers. With noted intermittent confusion throughout the night, taking nasal cannula off causing  a significant drop of sat to high 60's.

## 2011-11-21 NOTE — Progress Notes (Signed)
Physical Therapy Treatment Patient Details Name: Alexa Stephens MRN: [id number] DOB: 1950-11-04 Today's Date: 11/21/2011 Time: 1435-1500 PT Time Calculation (min): 25 min  PT Assessment / Plan / Recommendation Comments on Treatment Session  Mobilizing fairly well. continues to demonstrate decreased activity tolerance and poor safety awareness at times.     Follow Up Recommendations  Home health PT;Supervision/Assistance - 24 hour    Barriers to Discharge        Equipment Recommendations  Rolling walker with 5" wheels    Recommendations for Other Services    Frequency Min 3X/week   Plan Discharge plan remains appropriate    Precautions / Restrictions Precautions Precautions: Fall Restrictions Weight Bearing Restrictions: No   Pertinent Vitals/Pain     Mobility  Bed Mobility Bed Mobility: Supine to Sit Supine to Sit: 5: Supervision;HOB elevated Transfers Transfers: Stand to Sit;Sit to Stand Sit to Stand: 4: Min guard;From bed;From chair/3-in-1;With armrests Stand to Sit: 4: Min guard;To chair/3-in-1;With armrests Details for Transfer Assistance: x 2. VCs safety, hand placement.  Ambulation/Gait Ambulation/Gait Assistance: 4: Min assist Ambulation Distance (Feet): 125 Feet Assistive device: Rolling walker Ambulation/Gait Assistance Details: VCs safety, pacing. 2 standing rest breaks. Assist to stabilize intermittently. Pt distracted by surroundings at times-VCs to avoid obstacles.  Gait Pattern: Decreased stride length;Decreased step length - right;Decreased step length - left    Exercises     PT Diagnosis:    PT Problem List:   PT Treatment Interventions:     PT Goals Acute Rehab PT Goals Pt will go Sit to Stand: with modified independence PT Goal: Sit to Stand - Progress: Progressing toward goal Pt will go Stand to Sit: with modified independence PT Goal: Stand to Sit - Progress: Progressing toward goal Pt will Ambulate: >150 feet;with modified independence;with  least restrictive assistive device PT Goal: Ambulate - Progress: Progressing toward goal  Visit Information  Last PT Received On: 11/21/11 Assistance Needed: +1    Subjective Data  Subjective: "I'm alright" Patient Stated Goal: None stated   Cognition  Overall Cognitive Status: Impaired Area of Impairment: Safety/judgement Arousal/Alertness: Awake/alert Behavior During Session: [facility] for tasks performed Safety/Judgement: Decreased safety judgement for tasks assessed    Balance     End of Session PT - End of Session Equipment Utilized During Treatment: Oxygen;Gait belt Activity Tolerance: Patient limited by fatigue Patient left: in chair;with call bell/phone within reach;with chair alarm set;with family/visitor present   GP     Rebeca Alert [facility] 11/21/2011, 3:43 PM [phone]

## 2011-11-21 NOTE — Progress Notes (Signed)
CSW received consult for COPD Gold Protocol - CSW administered Depression Scale (patient scored 4/27) & Generalized Anxiety Disorder scale (patient scored 10/21). Screening forms placed on shadow chart. Patient states that she has been taking Effexor which she feels has been helpful with her anxiety. Dr. Suanne Marker text-paged with screening results. Patient states she would be amenable to psych consult if MD is agreeable. Patient states she plans to return home with her son, Harman Ferrin (h#: [phone] c#: [phone]) at discharge.  No further CSW needs identified.   Unice Bailey, LCSW [facility] Clinical Social Worker cell #: [phone]

## 2011-11-21 NOTE — Progress Notes (Signed)
[facility] PROGRESS NOTE  Alexa Stephens ZOX:[id number] DOB: [birth year] DOA: 11/17/2011 PCP: No primary provider on file.  Assessment/Plan: Principal Problem:  *CAP (community acquired pneumonia) Active Problems:  Acute-on-chronic respiratory failure  COPD exacerbation  Sepsis  Hypoxia  Principal problem CAP (community acquired pneumonia) with hypoxia  -Continue current antibiotics, cultures with no growth to date. -Improving, leukocytosis resolved, hemodynamically stable. Respiratory distress -Secondary to above possible element of volume overload -Improved with discontinuation of IV fluids, given Lasix and continued steroids and antibiotics for #1 and COPD treatment as below, give another dose of Lasix x1. Active Problems:  Acute-on-chronic respiratory failure -Treating pneumonia as above, see below for treatment of COPD exacerbation, supplemental oxygen and follow.  COPD exacerbation  -Continue bronchodilators, extra dose of solumedrol given on 9/23 secondary to his respiratory distress- as above -Continue supplemental oxygen, continue antibiotics, steroids follow and taper as appropriate. Sepsis  -clinically improved, secondary to #1 Hypertension -Continue Monitoring off antihypertensives for now, and resume if blood pressures remaining stable Hyponatremia -Resolved following initial hydration.  Code Status: full Family Communication: Son at bedside  Disposition Plan: to home when stable   Brief narrative: Pt is a 61 y.o. Female with h/o COPD admitted with worsening SOB, cough, fever, chills x1wk.S tates prescribed home O2 outpt but has not arrived yet. In the ED, Lab work up showed a WBC 18k, s.tach in the 120s, T 99.2  CXR demonstrated a RLL PNA. She Was admitted for further evaluation and management.   Consultants:  None  Procedures:  None  Antibiotics:  Ceftin and Zithromax started on 9/20  HPI/Subjective: Complaining of increased shortness of breath,  denies cough, denies chest pain.  Objective: Filed Vitals:   11/21/11 1042 11/21/11 1150 11/21/11 1413 11/21/11 1623  BP: 148/59  153/74   Pulse: 86  89   Temp: 98.3 F (36.8 C)  98.2 F (36.8 C)   TempSrc: Oral  Oral   Resp: 22  24   Height:      Weight:      SpO2: 95% 96% 100% 98%    Intake/Output Summary (Last 24 hours) at 11/21/11 1837 Last data filed at 11/21/11 1200  Gross per 24 hour  Intake    240 ml  Output   1050 ml  Net   -810 ml   Filed Weights   11/18/11 0150  Weight: 72.6 kg (160 lb 0.9 oz)    Exam:   General: Alert and oriented x3,nonlabored use with [place] oxygen on  Cardiovascular: ,Regular rate and rhythm, nl S1S2  Respiratory: improved air entry, no wheezes, decreased BS at bedside  Abdomen: Soft, bowel sounds present nontender nondistended no organomegaly or masses palpable  Extremities: No cyanosis and no edema  Data Reviewed: Basic Metabolic Panel:  Lab 11/21/11 8119 11/19/11 0420 11/18/11 0903 11/17/11 2040  NA 140 136 132* 128*  K 4.2 4.6 4.0 4.0  CL 97 101 95* 90*  CO2 39* 30 30 28   GLUCOSE 175* 213* 174* 139*  BUN 11 6 6 6   CREATININE 0.48* 0.46* 0.49* 0.55  CALCIUM 8.6 8.1* 8.4 9.1  MG -- -- -- --  PHOS -- -- -- --   Liver Function Tests: No results found for this basename: AST:5,ALT:5,ALKPHOS:5,BILITOT:5,PROT:5,ALBUMIN:5 in the last 168 hours No results found for this basename: LIPASE:5,AMYLASE:5 in the last 168 hours No results found for this basename: AMMONIA:5 in the last 168 hours CBC:  Lab 11/21/11 0450 11/20/11 0418 11/19/11 0420 11/18/11 0903 11/17/11 2040  WBC 10.4  15.4* 12.2* 17.4* 18.6*  NEUTROABS -- -- -- -- --  HGB 11.4* 11.8* 11.7* 12.5 13.4  HCT 36.6 38.1 35.7* 37.9 39.0  MCV 102.8* 103.5* 101.4* 97.9 96.3  PLT 396 370 220 252 243   Cardiac Enzymes:  Lab 11/17/11 2330  CKTOTAL --  CKMB --  CKMBINDEX --  TROPONINI <0.30   BNP (last 3 results) No results found for this basename: PROBNP:3 in the last  8760 hours CBG: No results found for this basename: GLUCAP:5 in the last 168 hours  Recent Results (from the past 240 hour(s))  CULTURE, BLOOD (ROUTINE X 2)     Status: Normal (Preliminary result)   Collection Time   11/18/11  5:39 AM      Component Value Range Status Comment   Specimen Description BLOOD RIGHT AC   Final    Special Requests BOTTLES DRAWN AEROBIC AND ANAEROBIC 5 CC EACH   Final    Culture  Setup Time 11/18/2011 11:36   Final    Culture     Final    Value:        BLOOD CULTURE RECEIVED NO GROWTH TO DATE CULTURE WILL BE HELD FOR 5 DAYS BEFORE ISSUING A FINAL NEGATIVE REPORT   Report Status PENDING   Incomplete   CULTURE, BLOOD (ROUTINE X 2)     Status: Normal (Preliminary result)   Collection Time   11/18/11  5:46 AM      Component Value Range Status Comment   Specimen Description BLOOD LEFT HAND   Final    Special Requests BOTTLES DRAWN AEROBIC AND ANAEROBIC 1.5 CC EACH   Final    Culture  Setup Time 11/18/2011 11:36   Final    Culture     Final    Value:        BLOOD CULTURE RECEIVED NO GROWTH TO DATE CULTURE WILL BE HELD FOR 5 DAYS BEFORE ISSUING A FINAL NEGATIVE REPORT   Report Status PENDING   Incomplete   CULTURE, EXPECTORATED SPUTUM-ASSESSMENT     Status: Normal   Collection Time   11/19/11 12:19 AM      Component Value Range Status Comment   Specimen Description SPUTUM   Final    Special Requests NONE   Final    Sputum evaluation     Final    Value: THIS SPECIMEN IS ACCEPTABLE. RESPIRATORY CULTURE REPORT TO FOLLOW.   Report Status 11/19/2011 FINAL   Final   CULTURE, RESPIRATORY     Status: Normal   Collection Time   11/19/11 12:19 AM      Component Value Range Status Comment   Specimen Description SPUTUM   Final    Special Requests NONE   Final    Gram Stain     Final    Value: RARE WBC PRESENT, PREDOMINANTLY PMN     NO SQUAMOUS EPITHELIAL CELLS SEEN     NO ORGANISMS SEEN   Culture NORMAL OROPHARYNGEAL FLORA   Final    Report Status 11/21/2011 FINAL    Final      Studies: No results found.  Scheduled Meds:    . albuterol  2.5 mg Nebulization Q4H  . azithromycin  500 mg Oral QHS  . cefTRIAXone (ROCEPHIN)  IV  1 g Intravenous Q24H  . doxepin  25 mg Oral Daily  . Fluticasone-Salmeterol  1 puff Inhalation Q12H  . furosemide  40 mg Intravenous Once  . heparin  5,000 Units Subcutaneous Q8H  . loratadine  10 mg Oral  Daily  . methylPREDNISolone (SOLU-MEDROL) injection  40 mg Intravenous Q12H  . simvastatin  20 mg Oral QPM  . venlafaxine XR  225 mg Oral Daily  . DISCONTD: azithromycin  500 mg Intravenous Q24H   Continuous Infusions:    Principal Problem:  *CAP (community acquired pneumonia) Active Problems:  Acute-on-chronic respiratory failure  COPD exacerbation  Sepsis  Hypoxia    Time spent: .    Kela Millin  [facility] Pager [phone]. If 8PM-8AM, please contact night-coverage at [email or web address], password [facility] 11/21/2011, 6:37 PM  LOS: 4 days

## 2011-11-21 NOTE — Telephone Encounter (Signed)
Noted  

## 2011-11-21 NOTE — Progress Notes (Signed)
PHARMACIST - PHYSICIAN COMMUNICATION DR:   [facility] CONCERNING: Antibiotic IV to Oral Route Change Policy  RECOMMENDATION: This patient is receiving Zithromax by the intravenous route.  Based on criteria approved by the Pharmacy and Therapeutics Committee, the antibiotic(s) is/are being converted to the equivalent oral dose form(s).   DESCRIPTION: These criteria include:  Patient being treated for a respiratory tract infection, urinary tract infection, or cellulitis  The patient is not neutropenic and does not exhibit a GI malabsorption state  The patient is eating (either orally or via tube) and/or has been taking other orally administered medications for a least 24 hours  The patient is improving clinically and has a Tmax < 100.5  If you have questions about this conversion, please contact the Pharmacy Department  []   [phone] )  [facility] []   [phone] )  [facility]  []   [phone] )  [facility] [x]   [phone] )  [facility]     Hessie Knows, PharmD, [place] Pager [phone] 11/21/2011 2:04 PM

## 2011-11-21 NOTE — Telephone Encounter (Signed)
This is a duplicate msg

## 2011-11-22 DIAGNOSIS — I10 Essential (primary) hypertension: Secondary | ICD-10-CM | POA: Diagnosis present

## 2011-11-22 DIAGNOSIS — E785 Hyperlipidemia, unspecified: Secondary | ICD-10-CM

## 2011-11-22 DIAGNOSIS — F329 Major depressive disorder, single episode, unspecified: Secondary | ICD-10-CM

## 2011-11-22 LAB — CBC
MCH: 32.1 pg (ref 26.0–34.0)
MCHC: 32 g/dL (ref 30.0–36.0)
Platelets: 485 10*3/uL — ABNORMAL HIGH (ref 150–400)

## 2011-11-22 LAB — BASIC METABOLIC PANEL
BUN: 11 mg/dL (ref 6–23)
Calcium: 9.3 mg/dL (ref 8.4–10.5)
GFR calc Af Amer: >90 mL/min (ref >90)
GFR calc non Af Amer: >90 mL/min (ref >90)
Glucose, Bld: 189 mg/dL — ABNORMAL HIGH (ref 70–99)
Sodium: 140 mEq/L (ref 135–145)

## 2011-11-22 MED ORDER — LOSARTAN POTASSIUM 50 MG PO TABS
100.0000 mg | ORAL_TABLET | Freq: Every day | ORAL | Status: DC
Start: 2011-11-22 — End: 2011-11-23
  Administered 2011-11-22 – 2011-11-23 (×2): 100 mg via ORAL
  Filled 2011-11-22 (×2): qty 2

## 2011-11-22 MED ORDER — LORAZEPAM 2 MG/ML IJ SOLN
0.5000 mg | Freq: Once | INTRAMUSCULAR | Status: AC
  Administered 2011-11-22: 0.5 mg via INTRAVENOUS
  Filled 2011-11-22: qty 1

## 2011-11-22 MED ORDER — AMLODIPINE BESYLATE 10 MG PO TABS
10.0000 mg | ORAL_TABLET | Freq: Every day | ORAL | Status: DC
  Administered 2011-11-22 – 2011-11-23 (×2): 10 mg via ORAL
  Filled 2011-11-22 (×2): qty 1

## 2011-11-22 MED ORDER — MOXIFLOXACIN HCL 400 MG PO TABS
400.0000 mg | ORAL_TABLET | Freq: Every day | ORAL | Status: DC
  Administered 2011-11-22: 400 mg via ORAL
  Filled 2011-11-22 (×2): qty 1

## 2011-11-22 NOTE — Progress Notes (Signed)
Per MD, no psych needs identified.  MD stating that Pt does not need psych intervention, at this time.  Notified psych MD.  Psych MD and psych CSW to sign off.  Please re-consult psych, should psych needs arise.  Providence Crosby, LCSWA Clinical Social Work [phone]

## 2011-11-22 NOTE — Progress Notes (Signed)
[facility] PROGRESS NOTE  Sheriece Jefcoat ZOX:[id number] DOB: 1950-11-02 DOA: 11/17/2011 PCP: No primary provider on file.  Assessment/Plan: Principal Problem:  *CAP (community acquired pneumonia) Active Problems:  Acute-on-chronic respiratory failure  COPD exacerbation  Sepsis  Hypoxia  Principal problem CAP (community acquired pneumonia) with hypoxia; in setting of chronic resp failure due to COPD  -continue inhalers, nebs tx and steroids (already tapering dose down) -antibiotics change to PO -Leukocytosis trending down -Most likely home tomorrow.  Respiratory distress -Secondary to above and mild fluid overload due to fluid resuscitation -Improved with discontinuation of IV fluids, given Lasix and continued steroids and antibiotics for #1 and COPD treatment as below.  Acute-on-chronic respiratory failure -Treating pneumonia as above, see below for treatment of COPD exacerbation, supplemental oxygen and follow.  -Will go home on oxygen. Per records was supposed to be on oxygen already at home as per pneumologist.  COPD exacerbation  -Continue supplemental oxygen, continue antibiotics, steroids   Hypertension -will resume norvasc and cozaar. -BP rising  Hyponatremia -Resolved following initial hydration.  HLD -Continue zocor  Depression -Stable; continue effexor -No SI or hallucinations  Code Status: full Family Communication: no family at bedside Disposition Plan: to home when stable   Brief narrative: Pt is a 61 y.o. Female with h/o COPD admitted with worsening SOB, cough, fever, chills x1wk.S tates prescribed home O2 outpt but has not arrived yet. In the ED, Lab work up showed a WBC 18k, s.tach in the 120s, T 99.2  CXR demonstrated a RLL PNA. She Was admitted for further evaluation and management.   Consultants:  None  Procedures:  None  Antibiotics:  Rocephin and Zithromax started on 9/20-9/25  Avelox 9/25 (plan to give for 5 more days to  complete a total of 10 days)  HPI/Subjective: Breathing better, denies cough, denies chest pain, abdominal pain, N/V. Afebrile.  Objective: Filed Vitals:   11/22/11 0458 11/22/11 0529 11/22/11 1224 11/22/11 1410  BP:  160/88  170/79  Pulse:  93  83  Temp:  97.8 F (36.6 C)  98.3 F (36.8 C)  TempSrc:  Oral  Oral  Resp:  24  22  Height:      Weight:      SpO2: 94% 95% 94% 94%    Intake/Output Summary (Last 24 hours) at 11/22/11 1526 Last data filed at 11/22/11 0200  Gross per 24 hour  Intake     50 ml  Output   1575 ml  Net  -1525 ml   Filed Weights   11/18/11 0150  Weight: 72.6 kg (160 lb 0.9 oz)    Exam:   General: Alert and oriented x3, nonlabored breathing with good O2 sat with [place] oxygen on  Cardiovascular: ,Regular rate and rhythm, nl S1S2  Respiratory: improved air entry, no wheezes, decreased BS at basis bilaterally  Abdomen: Soft, bowel sounds present, nontender nondistended no organomegaly or masses palpable  Extremities: No cyanosis and no edema  Data Reviewed: Basic Metabolic Panel:  Lab 11/22/11 8119 11/21/11 0450 11/19/11 0420 11/18/11 0903 11/17/11 2040  NA 140 140 136 132* 128*  K 4.7 4.2 4.6 4.0 4.0  CL 90* 97 101 95* 90*  CO2 42* 39* 30 30 28   GLUCOSE 189* 175* 213* 174* 139*  BUN 11 11 6 6 6   CREATININE 0.54 0.48* 0.46* 0.49* 0.55  CALCIUM 9.3 8.6 8.1* 8.4 9.1  MG -- -- -- -- --  PHOS -- -- -- -- --   CBC:  Lab 11/22/11 0434 11/21/11  0865 11/20/11 0418 11/19/11 0420 11/18/11 0903  WBC 12.9* 10.4 15.4* 12.2* 17.4*  NEUTROABS -- -- -- -- --  HGB 13.3 11.4* 11.8* 11.7* 12.5  HCT 41.6 36.6 38.1 35.7* 37.9  MCV 100.5* 102.8* 103.5* 101.4* 97.9  PLT 485* 396 370 220 252   Cardiac Enzymes:  Lab 11/17/11 2330  CKTOTAL --  CKMB --  CKMBINDEX --  TROPONINI <0.30     Recent Results (from the past 240 hour(s))  CULTURE, BLOOD (ROUTINE X 2)     Status: Normal (Preliminary result)   Collection Time   11/18/11  5:39 AM       Component Value Range Status Comment   Specimen Description BLOOD RIGHT AC   Final    Special Requests BOTTLES DRAWN AEROBIC AND ANAEROBIC 5 CC EACH   Final    Culture  Setup Time 11/18/2011 11:36   Final    Culture     Final    Value:        BLOOD CULTURE RECEIVED NO GROWTH TO DATE CULTURE WILL BE HELD FOR 5 DAYS BEFORE ISSUING A FINAL NEGATIVE REPORT   Report Status PENDING   Incomplete   CULTURE, BLOOD (ROUTINE X 2)     Status: Normal (Preliminary result)   Collection Time   11/18/11  5:46 AM      Component Value Range Status Comment   Specimen Description BLOOD LEFT HAND   Final    Special Requests BOTTLES DRAWN AEROBIC AND ANAEROBIC 1.5 CC EACH   Final    Culture  Setup Time 11/18/2011 11:36   Final    Culture     Final    Value:        BLOOD CULTURE RECEIVED NO GROWTH TO DATE CULTURE WILL BE HELD FOR 5 DAYS BEFORE ISSUING A FINAL NEGATIVE REPORT   Report Status PENDING   Incomplete   CULTURE, EXPECTORATED SPUTUM-ASSESSMENT     Status: Normal   Collection Time   11/19/11 12:19 AM      Component Value Range Status Comment   Specimen Description SPUTUM   Final    Special Requests NONE   Final    Sputum evaluation     Final    Value: THIS SPECIMEN IS ACCEPTABLE. RESPIRATORY CULTURE REPORT TO FOLLOW.   Report Status 11/19/2011 FINAL   Final   CULTURE, RESPIRATORY     Status: Normal   Collection Time   11/19/11 12:19 AM      Component Value Range Status Comment   Specimen Description SPUTUM   Final    Special Requests NONE   Final    Gram Stain     Final    Value: RARE WBC PRESENT, PREDOMINANTLY PMN     NO SQUAMOUS EPITHELIAL CELLS SEEN     NO ORGANISMS SEEN   Culture NORMAL OROPHARYNGEAL FLORA   Final    Report Status 11/21/2011 FINAL   Final      Studies: No results found.  Scheduled Meds:    . albuterol  2.5 mg Nebulization Q4H  . doxepin  25 mg Oral Daily  . Fluticasone-Salmeterol  1 puff Inhalation Q12H  . furosemide  40 mg Intravenous Once  . heparin  5,000 Units  Subcutaneous Q8H  . loratadine  10 mg Oral Daily  . methylPREDNISolone (SOLU-MEDROL) injection  40 mg Intravenous Q12H  . moxifloxacin  400 mg Oral Q2000  . simvastatin  20 mg Oral QPM  . venlafaxine XR  225 mg Oral Daily  .  DISCONTD: azithromycin  500 mg Oral QHS  . DISCONTD: cefTRIAXone (ROCEPHIN)  IV  1 g Intravenous Q24H     Time spent: .    [facility]  [facility] Pager [phone]. If 8PM-8AM, please contact night-coverage at [email or web address], password [email or web address] 11/22/2011, 3:26 PM  LOS: 5 days

## 2011-11-22 NOTE — Progress Notes (Signed)
CRITICAL VALUE ALERT  Critical value received:  CO2 42  Date of notification:  11/22/2011  Time of notification:  0610  Critical value read back:yes  Nurse who received alert:  L. Vergel de Lucy Chris RN  MD notified (1st page):  Kirtland Bouchard Schorr  Time of first page:  0620  MD notified (2nd page):  Time of second page:0640  Responding MD:  Jesusita Oka  Time MD responded:  [phone]

## 2011-11-22 NOTE — Progress Notes (Signed)
Talked to patient about follow up medical care, patient is a little disoriented at this time and is unable to recall her past medical care. Information given to patient on new PCP that are accepting patient, the [facility], [facility], [facility], [facility]; Per medical records, patient is seeing Dr Phill Mutter and was active with [facility], Dr Clelia Croft - [facility] is closed; Also documented that the patient was already on home 02 but it was not delivered yet? Holly with Dr Evlyn Courier office called, home 02 was ordered on Sept 4, 2013 with [facility]; Mayra Reel with with [facility] called the patient had financial issues that she wanted to take care of first before the 02 can be delivered to her home- 02 was arranged to be delivered tomorrow.

## 2011-11-23 DIAGNOSIS — R5381 Other malaise: Secondary | ICD-10-CM

## 2011-11-23 MED ORDER — MOXIFLOXACIN HCL 400 MG PO TABS: 400.0000 mg | ORAL_TABLET | Freq: Every day | ORAL | Status: AC

## 2011-11-23 MED ORDER — PREDNISONE 20 MG PO TABS: ORAL_TABLET | ORAL | Status: DC

## 2011-11-23 MED ORDER — ENSURE COMPLETE SHAKE PO LIQD: 1.0000 | Freq: Three times a day (TID) | ORAL | Status: DC

## 2011-11-23 MED ORDER — DONEPEZIL HCL 5 MG PO TABS: 5.0000 mg | ORAL_TABLET | Freq: Every evening | ORAL | Status: DC | PRN

## 2011-11-23 MED ORDER — ALBUTEROL SULFATE (5 MG/ML) 0.5% IN NEBU: 2.5000 mg | INHALATION_SOLUTION | RESPIRATORY_TRACT | Status: DC

## 2011-11-23 NOTE — Discharge Summary (Signed)
Physician Discharge Summary  Alexa Stephens OZH:[id number] DOB: 01-26-1951 DOA: 11/17/2011  PCP: No primary provider on file.  Admit date: 11/17/2011 Discharge date: 11/23/2011  Recommendations for Outpatient Follow-up:  1. Follow up with pulmonologist on 12/07/11 at 9:30am for follow up on lung disease and acute exacerbation of COPd. Repeat CXR 2 views and adjust medication regimen for maintenance therapy.  Discharge Diagnoses:  Principal Problem:  *CAP (community acquired pneumonia) Active Problems:  Acute-on-chronic respiratory failure  COPD exacerbation  Sepsis  Hypoxia  HTN (hypertension)  HLD (hyperlipidemia)  Depression   Discharge Condition: stable and improved  Diet recommendation: heart healthy  Filed Weights   11/18/11 0150  Weight: 72.6 kg (160 lb 0.9 oz)    History of present illness:  Pt is a 61 y.o. Female with h/o COPD admitted with worsening SOB, cough, fever, chills x1wk.S tates prescribed home O2 outpt but has not arrived yet. In the ED, Lab work up showed a WBC 18k, s.tach in the 120s, T 99.2 CXR demonstrated a RLL PNA. She Was admitted for further evaluation and management.   Hospital Course:   CAP (community acquired pneumonia) with hypoxia; in setting of chronic resp failure due to COPD  -continue inhalers, nebs tx and steroids (already tapering dose down)  -antibiotics change to PO avelox 400mg  daily in order to complete a total of 10 days -no further fever, breathing improved and clinically stable for discharge -Most likely home tomorrow.   Respiratory distress  -Secondary to above and mild fluid overload due to fluid resuscitation  -Improved with discontinuation of IV fluids, given Lasix and continued steroids and antibiotics for #1 and COPD treatment as below.   Acute-on-chronic respiratory failure  -Treating pneumonia as above, see below for treatment of COPD exacerbation, supplemental oxygen 24/7 -Will go home on oxygen. Per records was supposed  to be on oxygen already at home as per pulmonologist.   COPD exacerbation  -Continue supplemental oxygen, inhalers and nebulizer treatment as instructed -continue antibiotics and tapering steroids  -Follow with pulmonary service in 2 weeks (at that time repeat 2 views CXR and provide further medications adjustments if needed.  Hypertension  -Low sodium heart healthy diet -continue norvasc and cozaar.  -BP rising   Hyponatremia  -Resolved following initial hydration.   HLD  -Continue zocor   Dementia and dpression  -continue effexor  -No SI or hallucinations -Started on Aricept  Physical deconditioning -PT/OT at home -also with 24 hours supervision by son and daughter in law.  Metabolic encephalopathy and mild dementia: -improved and almost back to baseline after treatment of infection. -Also started on aricept to decrease progression of dementia and stabilize mood symptoms.   Consultations:  Pulmonology curbside (DR. Simonds)  Discharge Exam: Filed Vitals:   11/23/11 0553 11/23/11 0915 11/23/11 0956 11/23/11 1309  BP: 144/85  148/72   Pulse: 99     Temp: 99.1 F (37.3 C)     TempSrc: Oral     Resp: 20     Height:      Weight:      SpO2: 95% 95%  95%    General: NAD; breathing comfortable; O2 sat 93-94% 4L Cardiovascular: RRR, no rubs or gallops Respiratory: scattered rhonchi, no wheezing; no rales Neuro: no focal deficit; patient with flat affect and mild dementia; MMSE 26-27/30  Discharge Instructions  Discharge Orders    Future Appointments: Provider: Department: Dept Phone: Center:   12/07/2011 9:30 AM Julio Sicks, NP [facility] [phone] None   12/27/2011  3:30 PM [name], MD [facility] [phone] None     Future Orders Please Complete By Expires   For home use only DME Walker rolling      Diet - low sodium heart healthy      Increase activity slowly      Discharge instructions      Comments:   -Take medications as  prescribed -Oxygen 24/7 3-4L all the time -Keep yourself hydrated -Follow with PCP and also with pulmonologist as instructed       Medication List     As of 11/23/2011  1:11 PM    TAKE these medications         albuterol 108 (90 BASE) MCG/ACT inhaler   Commonly known as: PROVENTIL HFA;VENTOLIN HFA   Inhale 2 puffs into the lungs every 4 (four) hours as needed. For shortness of breath.      albuterol-ipratropium 18-103 MCG/ACT inhaler   Commonly known as: COMBIVENT   Inhale 2 puffs into the lungs every 6 (six) hours as needed. For shortness of breath.      amLODipine 10 MG tablet   Commonly known as: NORVASC   Take 10 mg by mouth daily.      butalbital-acetaminophen-caffeine 50-325-40-30 MG per capsule   Commonly known as: FIORICET WITH CODEINE   Take 1 capsule by mouth every 4 (four) hours as needed. For headaches.      donepezil 5 MG tablet   Commonly known as: ARICEPT   Take 1 tablet (5 mg total) by mouth at bedtime as needed.      doxepin 25 MG capsule   Commonly known as: SINEQUAN   Take 25 mg by mouth daily.      ENSURE COMPLETE SHAKE Liqd   Take 1 Bottle by mouth 3 (three) times daily between meals.      Fluticasone-Salmeterol 500-50 MCG/DOSE Aepb   Commonly known as: ADVAIR   Inhale 1 puff into the lungs every 12 (twelve) hours.      loratadine 10 MG tablet   Commonly known as: CLARITIN   Take 10 mg by mouth daily.      losartan 100 MG tablet   Commonly known as: COZAAR   Take 100 mg by mouth daily.      moxifloxacin 400 MG tablet   Commonly known as: AVELOX   Take 1 tablet (400 mg total) by mouth daily at 8 pm.      predniSONE 20 MG tablet   Commonly known as: DELTASONE   Take 4 tablets by mouth daily X 2 day; then 3 tablets by mouth daily X 2 days; then 2 tablets by mouth daily X 2 days; then 1 tablet by mouth daily X 2 days; then 1/2 tablet by mouth X 2 days; then resume chronic prednisone dose of 2.5mg  and 5mg  alternating between 0.5 tab and 1 tab  daily.      simvastatin 20 MG tablet   Commonly known as: ZOCOR   Take 20 mg by mouth every evening.      venlafaxine XR 75 MG 24 hr capsule   Commonly known as: EFFEXOR-XR   Take 75 mg by mouth daily. Taken with 150mg  cap to equal 225mg  daily.      venlafaxine XR 150 MG 24 hr capsule   Commonly known as: EFFEXOR-XR   Take 150 mg by mouth daily. Taken with 75mg  cap to equal 225mg  daily.           Follow-up Information  Follow up with PARRETT,TAMMY, NP. On 12/07/2011. (At 9:30)    Contact information:   [facility] [address] [place] [address] [phone]           The results of significant diagnostics from this hospitalization (including imaging, microbiology, ancillary and laboratory) are listed below for reference.    Significant Diagnostic Studies: Dg Chest 2 View (if Patient Has Fever And/or Copd)  11/17/2011  *RADIOLOGY REPORT*  Clinical Data: SOB, cough  CHEST - 2 VIEW  Comparison: 11/01/2011  Findings: Patchy right lower lobe opacity, suspicious for pneumonia.  Underlying chronic interstitial markings/emphysematous changes. No pleural effusion or pneumothorax.  Cardiomediastinal silhouette is within normal limits.  Degenerative changes of the visualized thoracolumbar spine.  IMPRESSION: Suspected right lower lobe pneumonia.   Original Report Authenticated By: Charline Bills, M.D.    Dg Chest 2 View  11/01/2011  *RADIOLOGY REPORT*  Clinical Data: COPD, shortness of breath  CHEST - 2 VIEW  Comparison: 06/24/2009  Findings: Chronic interstitial markings/emphysematous changes. No pleural effusion or pneumothorax.  Cardiomediastinal silhouette is within normal limits.  Degenerative changes of the visualized thoracolumbar spine.  IMPRESSION: No evidence of acute cardiopulmonary disease.  Chronic interstitial markings/emphysematous changes.   Original Report Authenticated By: Charline Bills, M.D.     Microbiology: Recent Results (from the past  240 hour(s))  CULTURE, BLOOD (ROUTINE X 2)     Status: Normal (Preliminary result)   Collection Time   11/18/11  5:39 AM      Component Value Range Status Comment   Specimen Description BLOOD RIGHT AC   Final    Special Requests BOTTLES DRAWN AEROBIC AND ANAEROBIC 5 CC EACH   Final    Culture  Setup Time 11/18/2011 11:36   Final    Culture     Final    Value:        BLOOD CULTURE RECEIVED NO GROWTH TO DATE CULTURE WILL BE HELD FOR 5 DAYS BEFORE ISSUING A FINAL NEGATIVE REPORT   Report Status PENDING   Incomplete   CULTURE, BLOOD (ROUTINE X 2)     Status: Normal (Preliminary result)   Collection Time   11/18/11  5:46 AM      Component Value Range Status Comment   Specimen Description BLOOD LEFT HAND   Final    Special Requests BOTTLES DRAWN AEROBIC AND ANAEROBIC 1.5 CC EACH   Final    Culture  Setup Time 11/18/2011 11:36   Final    Culture     Final    Value:        BLOOD CULTURE RECEIVED NO GROWTH TO DATE CULTURE WILL BE HELD FOR 5 DAYS BEFORE ISSUING A FINAL NEGATIVE REPORT   Report Status PENDING   Incomplete   CULTURE, EXPECTORATED SPUTUM-ASSESSMENT     Status: Normal   Collection Time   11/19/11 12:19 AM      Component Value Range Status Comment   Specimen Description SPUTUM   Final    Special Requests NONE   Final    Sputum evaluation     Final    Value: THIS SPECIMEN IS ACCEPTABLE. RESPIRATORY CULTURE REPORT TO FOLLOW.   Report Status 11/19/2011 FINAL   Final   CULTURE, RESPIRATORY     Status: Normal   Collection Time   11/19/11 12:19 AM      Component Value Range Status Comment   Specimen Description SPUTUM   Final    Special Requests NONE   Final    Gram  Stain     Final    Value: RARE WBC PRESENT, PREDOMINANTLY PMN     NO SQUAMOUS EPITHELIAL CELLS SEEN     NO ORGANISMS SEEN   Culture NORMAL OROPHARYNGEAL FLORA   Final    Report Status 11/21/2011 FINAL   Final      Labs: Basic Metabolic Panel:  Lab 11/22/11 0981 11/21/11 0450 11/19/11 0420 11/18/11 0903 11/17/11 2040   NA 140 140 136 132* 128*  K 4.7 4.2 4.6 4.0 4.0  CL 90* 97 101 95* 90*  CO2 42* 39* 30 30 28   GLUCOSE 189* 175* 213* 174* 139*  BUN 11 11 6 6 6   CREATININE 0.54 0.48* 0.46* 0.49* 0.55  CALCIUM 9.3 8.6 8.1* 8.4 9.1  MG -- -- -- -- --  PHOS -- -- -- -- --   CBC:  Lab 11/22/11 0434 11/21/11 0450 11/20/11 0418 11/19/11 0420 11/18/11 0903  WBC 12.9* 10.4 15.4* 12.2* 17.4*  NEUTROABS -- -- -- -- --  HGB 13.3 11.4* 11.8* 11.7* 12.5  HCT 41.6 36.6 38.1 35.7* 37.9  MCV 100.5* 102.8* 103.5* 101.4* 97.9  PLT 485* 396 370 220 252   Cardiac Enzymes:  Lab 11/17/11 2330  CKTOTAL --  CKMB --  CKMBINDEX --  TROPONINI <0.30    Time coordinating discharge: > 30 minutes  Signed:  Ruqaya Strauss  [facility] 11/23/2011, 1:11 PM

## 2011-11-23 NOTE — Progress Notes (Signed)
Pt desated to 87% on 3L O2 via [place]. Pt was sitting in bed watching T.V. when this occurred. Oxygen increased to 4L. O2 sat increased to 91% on O2 via [place]. Md notified.  Will continue to monitor.

## 2011-11-23 NOTE — Progress Notes (Signed)
Patient to be discharged home today; Patient's son is in the room with the patient; CM talked to son/ Jonny Ruiz about home Oxygen that was ordered by the Pulmonologist Sept 4, 2013; patient and son has not completed paper work for the charity case with [facility] for home 02; Lecrecia with [facility] called and she talked to the patient's son via telephone to work out an agreement. Home oxygen is arranged, oxygen and nebulizer machine to be delivered to the patient's room. Patient and son is also agreeable to HHC/ chose [facility]; Olene Craven RN with [facility] called for arrangements; Patient has not insurance, financial counselor talked to patient/ son yesterday and patient completed paperwork. John had more questions related to hospital bill, Melissa Montane Financial counselor called and informed to call the patient's son in the patient's room. Patient cannot afford her medication. Patient qualifies for the indigent funds; Prescriptions sent to pharmacy and the pharmacist will contact the patient's nurse when the prescriptions are filled;

## 2011-11-24 LAB — CULTURE, BLOOD (ROUTINE X 2): Culture: NO GROWTH

## 2011-11-27 ENCOUNTER — Telehealth: Payer: Self-pay | Admitting: Pulmonary Disease

## 2011-11-27 DIAGNOSIS — J449 Chronic obstructive pulmonary disease, unspecified: Secondary | ICD-10-CM

## 2011-11-27 NOTE — Telephone Encounter (Signed)
Called, spoke Angie, admissions nurse with [facility].  States she visited pt on Friday.  Pt is self pay and will only agree to pay for 1 visit for PT, OT, and nursing.  Angie states her visit on Friday was the 1 nursing visit.  PT will see pt tomorrow -- she is hoping they will be able to convience pt to pay for more visits bc she is "weak as dish water."  States she did educate her on copd and pna recovery during this visit.  She believes pt's meds are good because her daughter in law is a Fish farm manager.  They have her meds arranged.  Angie would like an order for social worker for pt given above.  Dr. Craige Cotta, is this ok?  Pls advise.  Thank you.

## 2011-11-28 NOTE — Telephone Encounter (Signed)
Okay to place order for social worker evaluation.

## 2011-11-28 NOTE — Telephone Encounter (Signed)
Order was sent to [facility] for social work  [facility] for Angie to be made aware

## 2011-12-06 ENCOUNTER — Telehealth: Payer: Self-pay | Admitting: Internal Medicine

## 2011-12-06 ENCOUNTER — Ambulatory Visit: Payer: Self-pay | Admitting: Internal Medicine

## 2011-12-06 DIAGNOSIS — Z0289 Encounter for other administrative examinations: Secondary | ICD-10-CM

## 2011-12-06 NOTE — Telephone Encounter (Signed)
PT called at 10:38 to say she can't get here.  Is it ok to RS the appt?

## 2011-12-07 ENCOUNTER — Ambulatory Visit (INDEPENDENT_AMBULATORY_CARE_PROVIDER_SITE_OTHER): Payer: Self-pay | Admitting: Adult Health

## 2011-12-07 ENCOUNTER — Ambulatory Visit (INDEPENDENT_AMBULATORY_CARE_PROVIDER_SITE_OTHER)
Admission: RE | Admit: 2011-12-07 | Discharge: 2011-12-07 | Disposition: A | Discharge status: Home / Self Care | Payer: Self-pay | Source: Ambulatory Visit | Attending: Adult Health | Admitting: Adult Health

## 2011-12-07 ENCOUNTER — Encounter: Payer: Self-pay | Admitting: Adult Health

## 2011-12-07 VITALS — BP 132/74 | HR 92 | Temp 96.9°F | Ht 63.0 in | Wt 153.4 lb

## 2011-12-07 DIAGNOSIS — J189 Pneumonia, unspecified organism: Secondary | ICD-10-CM

## 2011-12-07 DIAGNOSIS — I10 Essential (primary) hypertension: Secondary | ICD-10-CM

## 2011-12-07 DIAGNOSIS — J449 Chronic obstructive pulmonary disease, unspecified: Secondary | ICD-10-CM

## 2011-12-07 DIAGNOSIS — J4489 Other specified chronic obstructive pulmonary disease: Secondary | ICD-10-CM

## 2011-12-07 MED ORDER — LOSARTAN POTASSIUM 100 MG PO TABS: 100.0000 mg | ORAL_TABLET | Freq: Every day | ORAL | Status: DC

## 2011-12-07 MED ORDER — AMLODIPINE BESYLATE 10 MG PO TABS: 10.0000 mg | ORAL_TABLET | Freq: Every day | ORAL | Status: DC

## 2011-12-07 MED ORDER — IPRATROPIUM-ALBUTEROL 18-103 MCG/ACT IN AERO: 2.0000 | INHALATION_SPRAY | Freq: Four times a day (QID) | RESPIRATORY_TRACT | Status: DC | PRN

## 2011-12-07 MED ORDER — CLONAZEPAM 1 MG PO TABS: 1.0000 mg | ORAL_TABLET | Freq: Two times a day (BID) | ORAL | Status: DC | PRN

## 2011-12-07 MED ORDER — FLUTICASONE-SALMETEROL 500-50 MCG/DOSE IN AEPB: 1.0000 | INHALATION_SPRAY | Freq: Two times a day (BID) | RESPIRATORY_TRACT | Status: DC

## 2011-12-07 NOTE — Patient Instructions (Addendum)
Continue on Advair Twice daily  -brush/rinse /gargle after use.  Increase Prednisone 5mg  2 tabs daily and hold at this dose until seen back in 2 weeks  Follow up Dr. Craige Cotta  As planned in 2 weeks and As needed   Please contact office for sooner follow up if symptoms do not improve or worsen or seek emergency care  We will refer you to a primary care doctor  I refilled your blood pressure and anxiety medicine for this month only.

## 2011-12-08 ENCOUNTER — Other Ambulatory Visit: Payer: Self-pay | Admitting: *Deleted

## 2011-12-08 MED ORDER — IPRATROPIUM-ALBUTEROL 20-100 MCG/ACT IN AERS: 1.0000 | INHALATION_SPRAY | Freq: Four times a day (QID) | RESPIRATORY_TRACT | Status: DC

## 2011-12-12 NOTE — Assessment & Plan Note (Signed)
We will refer you to a primary care doctor  I refilled your blood pressure and anxiety medicine for this month only.

## 2011-12-12 NOTE — Assessment & Plan Note (Signed)
Exacerbation-slow to resolve   Plan Continue on Advair Twice daily  -brush/rinse /gargle after use.  Increase Prednisone 5mg  2 tabs daily and hold at this dose until seen back in 2 weeks  Follow up Dr. Craige Cotta  As planned in 2 weeks and As needed   Please contact office for sooner follow up if symptoms do not improve or worsen or seek emergency care

## 2011-12-13 NOTE — Progress Notes (Signed)
  Subjective:    Patient ID: Alexa Stephens, female    DOB: 1950/09/30, 61 y.o.   MRN: [id number]  HPI 61 yo female with known hx of COPD , active smoker  10/101/3 [facility]  Returns for post hospital follow up  For PNA  Admitted 11/17/11 w/ acute on chronic resp failure , COPD exacerbation and CAP(RLL infiltrate)   tx w/ IV abx, steroids and nebs. Did require some diuresis due to fluid overload.  Did have some delirium/dementia during hospital stay, started on aricept prior to discharge.  Since discharge some better but still has cough and congestion w/ nasal drainage.  Has finished abx. Now tapering steroids.  CXR today shows resolved PNA.  No hemotpysis or edema.    Review of Systems Constitutional:   No  weight loss, night sweats,  Fevers, chills, + fatigue, or  lassitude.  HEENT:   No headaches,  Difficulty swallowing,  Tooth/dental problems, or  Sore throat,                No sneezing, itching, ear ache,  +nasal congestion, post nasal drip,   CV:  No chest pain,  Orthopnea, PND, swelling in lower extremities, anasarca, dizziness, palpitations, syncope.   GI  No heartburn, indigestion, abdominal pain, nausea, vomiting, diarrhea, change in bowel habits, loss of appetite, bloody stools.   Resp:    No coughing up of blood.  Marland Kitchen  No chest wall deformity  Skin: no rash or lesions.  GU: no dysuria, change in color of urine, no urgency or frequency.  No flank pain, no hematuria   MS:  No joint pain or swelling.  No decreased range of motion.  No back pain.  Psych:  No change in mood or affect. No depression or anxiety.  No memory loss.         Objective:   Physical Exam GEN: A/Ox3; pleasant , NAD   HEENT:  [place]/AT,  EACs-clear, TMs-wnl, NOSE-clear drainage , THROAT-clear, no lesions, no postnasal drip or exudate noted.   NECK:  Supple w/ fair ROM; no JVD; normal carotid impulses w/o bruits; no thyromegaly or nodules palpated; no lymphadenopathy.  RESP  Coarse BS no  accessory muscle use, no dullness to percussion  CARD:  RRR, no m/r/g  , no peripheral edema, pulses intact, no cyanosis or clubbing.  GI:   Soft & nt; nml bowel sounds; no organomegaly or masses detected.  Musco: Warm bil, no deformities or joint swelling noted.   Neuro: alert, no focal deficits noted.    Skin: Warm, no lesions or rashes         Assessment & Plan:

## 2011-12-27 ENCOUNTER — Encounter: Payer: Self-pay | Admitting: Pulmonary Disease

## 2011-12-27 ENCOUNTER — Ambulatory Visit (INDEPENDENT_AMBULATORY_CARE_PROVIDER_SITE_OTHER): Payer: Self-pay | Admitting: Pulmonary Disease

## 2011-12-27 VITALS — BP 110/78 | HR 105 | Temp 97.5°F | Ht 62.0 in | Wt 155.0 lb

## 2011-12-27 DIAGNOSIS — J4489 Other specified chronic obstructive pulmonary disease: Secondary | ICD-10-CM

## 2011-12-27 DIAGNOSIS — R0902 Hypoxemia: Secondary | ICD-10-CM

## 2011-12-27 DIAGNOSIS — F3289 Other specified depressive episodes: Secondary | ICD-10-CM

## 2011-12-27 DIAGNOSIS — F32A Depression, unspecified: Secondary | ICD-10-CM

## 2011-12-27 DIAGNOSIS — F329 Major depressive disorder, single episode, unspecified: Secondary | ICD-10-CM

## 2011-12-27 DIAGNOSIS — J9611 Chronic respiratory failure with hypoxia: Secondary | ICD-10-CM

## 2011-12-27 DIAGNOSIS — J9610 Chronic respiratory failure, unspecified whether with hypoxia or hypercapnia: Secondary | ICD-10-CM

## 2011-12-27 DIAGNOSIS — J449 Chronic obstructive pulmonary disease, unspecified: Secondary | ICD-10-CM

## 2011-12-27 DIAGNOSIS — E785 Hyperlipidemia, unspecified: Secondary | ICD-10-CM

## 2011-12-27 MED ORDER — SIMVASTATIN 20 MG PO TABS: 20.0000 mg | ORAL_TABLET | Freq: Every evening | ORAL | Status: DC

## 2011-12-27 MED ORDER — BUTALBITAL-APAP-CAFF-COD 50-325-40-30 MG PO CAPS: 1.0000 | ORAL_CAPSULE | ORAL | Status: DC | PRN

## 2011-12-27 NOTE — Progress Notes (Signed)
Chief Complaint  Patient presents with  . Follow-up    breathing has unchanged, very little cough. no wheezing, chest tx    History of Present Illness: Alexa Stephens is a 61 y.o. female smoker with severe COPD and hypoxia.  She was in hospital from 9/20 to 9/26 for pneumonia and AECOPD.    She has been doing better.  She still gets winded very easily.  She has occasional cough.  She is not bringing up much sputum, and not having wheeze.     Tests: Spirometry 11/01/11>>FEV1 0.48 (21%), FEV1% 37 (difficult pt effort). 11/01/11 SpO2 85% on room air at rest  Past Medical History  Diagnosis Date  . Anxiety disorder   . Asthma   . COPD (chronic obstructive pulmonary disease)     Not on home O2 yet but is prescribed.  . Headache   . Hyperlipemia   . Hypertension     Past Surgical History  Procedure Date  . Cosmetic surgery   . Cystectomy   . Hand surgery     Current Outpatient Prescriptions on File Prior to Visit  Medication Sig Dispense Refill  . albuterol (PROVENTIL HFA;VENTOLIN HFA) 108 (90 BASE) MCG/ACT inhaler Inhale 2 puffs into the lungs every 4 (four) hours as needed. For shortness of breath.      Marland Kitchen albuterol (PROVENTIL) (5 MG/ML) 0.5% nebulizer solution Take 0.5 mLs (2.5 mg total) by nebulization every 4 (four) hours.  40 mL  1  . butalbital-acetaminophen-caffeine (FIORICET WITH CODEINE) 50-325-40-30 MG per capsule Take 1 capsule by mouth every 4 (four) hours as needed. For headaches.      . clonazePAM (KLONOPIN) 1 MG tablet Take 1 tablet (1 mg total) by mouth 2 (two) times daily as needed.  60 tablet  0  . donepezil (ARICEPT) 5 MG tablet Take 1 tablet (5 mg total) by mouth at bedtime as needed.  30 tablet  1  . doxepin (SINEQUAN) 25 MG capsule Take 25 mg by mouth daily.      . Fluticasone-Salmeterol (ADVAIR) 500-50 MCG/DOSE AEPB Inhale 1 puff into the lungs every 12 (twelve) hours.  60 each  3  . Ipratropium-Albuterol (COMBIVENT RESPIMAT) 20-100 MCG/ACT AERS respimat Inhale  1 puff into the lungs every 6 (six) hours.  1 Inhaler  4  . loratadine (CLARITIN) 10 MG tablet Take 10 mg by mouth daily.      Marland Kitchen losartan (COZAAR) 100 MG tablet Take 1 tablet (100 mg total) by mouth daily.  30 tablet  0  . Nutritional Supplements (ENSURE COMPLETE SHAKE) LIQD Take 1 Bottle by mouth 3 (three) times daily between meals.      . predniSONE (DELTASONE) 20 MG tablet Take 4 tablets by mouth daily X 2 day; then 3 tablets by mouth daily X 2 days; then 2 tablets by mouth daily X 2 days; then 1 tablet by mouth daily X 2 days; then 1/2 tablet by mouth X 2 days; then resume chronic prednisone dose of 2.5mg  and 5mg  alternating between 0.5 tab and 1 tab daily.  25 tablet  0  . simvastatin (ZOCOR) 20 MG tablet Take 20 mg by mouth every evening.      . Valsartan (DIOVAN PO) Take 1 tablet by mouth daily. Unsure about dosage      . venlafaxine XR (EFFEXOR-XR) 150 MG 24 hr capsule Take 150 mg by mouth daily. Taken with 75mg  cap to equal 225mg  daily.      Marland Kitchen venlafaxine XR (EFFEXOR-XR) 75 MG 24 hr  capsule Take 75 mg by mouth daily. Taken with 150mg  cap to equal 225mg  daily.        Allergies  Allergen Reactions  . Lexapro (Escitalopram)     Gi upset  . Naproxen     High BP  . Skelaxin (Metaxalone)      Physical Exam: Filed Vitals:   12/27/11 1548 12/27/11 1551  BP:  110/78  Pulse:  105  Temp: 97.5 F (36.4 C)   TempSrc: Oral   Height: 5\' 2"  (1.575 m)   Weight: 155 lb (70.308 kg)   SpO2:  94%   Wt Readings from Last 3 Encounters:  12/27/11 155 lb (70.308 kg)  12/07/11 153 lb 6.4 oz (69.582 kg)  11/18/11 160 lb 0.9 oz (72.6 kg)    Body mass index is 28.35 kg/(m^2).   General - No distress, wearing oxygen ENT - No sinus tenderness, no oral exudate, no LAN Cardiac - s1s2 regular, no murmur Chest - prolonged exhalation, decreased breath sounds, no wheeze/rales Back - no focal tenderness Abd - soft, non-tender Ext - no edema, deformity right hand Neuro - normal strength Skin - no  rashes Psych - normal mood, and behavior.     Assessment/Plan:  [name], MD [facility]/Sleep Pager:  [phone] 12/27/2011, 3:59 PM

## 2011-12-27 NOTE — Patient Instructions (Signed)
Follow up in 2 to 3 months

## 2011-12-28 NOTE — Assessment & Plan Note (Signed)
Continue 2 liters at rest, 3 liters with exertion and sleep.  Will re-assess her oxygen needs at next visit.   

## 2011-12-28 NOTE — Assessment & Plan Note (Signed)
Refilled her simvastatin.  

## 2011-12-28 NOTE — Assessment & Plan Note (Signed)
Refilled her fioricet.  She is in the process of arranging for new primary care physician.

## 2011-12-28 NOTE — Assessment & Plan Note (Signed)
She has severe COPD.  She has difficulty affording her medications.  She is to continue advair, combivent, and albuterol.  She is to continue prednisone for now.  Will see if she can tolerate dose reduction of prednisone at next visit.

## 2012-02-12 ENCOUNTER — Telehealth: Payer: Self-pay | Admitting: Pulmonary Disease

## 2012-02-12 NOTE — Telephone Encounter (Signed)
Last rx was written on 12/27/11 for #30 with 2 refills for fiorecet. Not sure if this is the medication the pt is needing, need to verify this with the pt.  I called [facility] and they advised rx is ready to be picked-up. I atc the pt but it is a [facility] phone and would not accept calls at this time. WCB. Carron Curie, CMA

## 2012-02-13 NOTE — Telephone Encounter (Signed)
I spoke with pt and she stated the fiorcet is not helping. She is requesting to have something stronger called in. i advised pt call her pcp. She stated she does not have one and is having a hard time finding one. I advised her [facility] does not prescribe medications stronger than this for her problem but still ask I send this over to him. Please advise Dr. Craige Cotta thanks  Allergies  Allergen Reactions  . Lexapro (Escitalopram)     Gi upset  . Naproxen     High BP  . Skelaxin (Metaxalone)

## 2012-02-13 NOTE — Telephone Encounter (Signed)
Please advise patient that neck pain like this can sometimes be related to inflammation and may respond better to medicines like ibuprofen.  She can try ibuprofen 200 mg pills, and take one or two pills up to 3 times per day as needed.  She can get this over the counter without a prescription.  She can call back after few days if this does not help.

## 2012-02-13 NOTE — Telephone Encounter (Signed)
Pt notified of VS recs and verbalized understanding.

## 2012-02-13 NOTE — Telephone Encounter (Signed)
ATC x1. NA. Pt has [facility] and our call could not go through. Will try again later.  Also ATC [facility] to see if the pt had picked up the RX but was placed on hold 4 times and could never speak with a person.

## 2012-03-15 ENCOUNTER — Encounter: Payer: Self-pay | Admitting: Pulmonary Disease

## 2012-03-15 ENCOUNTER — Ambulatory Visit (INDEPENDENT_AMBULATORY_CARE_PROVIDER_SITE_OTHER): Payer: Self-pay | Admitting: Pulmonary Disease

## 2012-03-15 VITALS — BP 110/76 | HR 79 | Temp 97.5°F | Ht 62.0 in | Wt 151.2 lb

## 2012-03-15 DIAGNOSIS — F17200 Nicotine dependence, unspecified, uncomplicated: Secondary | ICD-10-CM

## 2012-03-15 DIAGNOSIS — J449 Chronic obstructive pulmonary disease, unspecified: Secondary | ICD-10-CM

## 2012-03-15 DIAGNOSIS — Z720 Tobacco use: Secondary | ICD-10-CM

## 2012-03-15 DIAGNOSIS — R0981 Nasal congestion: Secondary | ICD-10-CM

## 2012-03-15 DIAGNOSIS — R0683 Snoring: Secondary | ICD-10-CM

## 2012-03-15 DIAGNOSIS — J9610 Chronic respiratory failure, unspecified whether with hypoxia or hypercapnia: Secondary | ICD-10-CM

## 2012-03-15 DIAGNOSIS — R0609 Other forms of dyspnea: Secondary | ICD-10-CM

## 2012-03-15 DIAGNOSIS — R0902 Hypoxemia: Secondary | ICD-10-CM

## 2012-03-15 DIAGNOSIS — J3489 Other specified disorders of nose and nasal sinuses: Secondary | ICD-10-CM

## 2012-03-15 DIAGNOSIS — J9611 Chronic respiratory failure with hypoxia: Secondary | ICD-10-CM

## 2012-03-15 MED ORDER — MOMETASONE FURO-FORMOTEROL FUM 100-5 MCG/ACT IN AERO: 2.0000 | INHALATION_SPRAY | Freq: Two times a day (BID) | RESPIRATORY_TRACT | Status: DC

## 2012-03-15 NOTE — Patient Instructions (Signed)
Saline nasal rinse (nasal irrigation) daily as needed Stop advair Dulera two puffs twice per day, and rinse mouth after each use Call when ready to set up sleep study Follow up in May 2014

## 2012-03-15 NOTE — Progress Notes (Signed)
Chief Complaint  Patient presents with  . Follow-up    she has good and bad days with her breathing. c/o chest congestion, nasal congestion, blowing out some blody mucus from her nose,. slight cough. increase wheezing and chest tx in the mornings. pt states this AM she was at 76% on 3 liters when she woke up from sleeping.     History of Present Illness: Alexa Stephens is a 62 y.o. female smoker with severe COPD and hypoxia.  She is starting to get more sinus congestion.  She has occasional cough with clear sputum.  She gets occasional wheezing.  She gets winded very easily.  She feels combivent works best, but has difficulty affording this.  She is not sure how much advair is helping, and she feels this causes a build up in the back of her throat.  She has applied for medicaid, and is hopefully this will be approved in April.  She has noticed trouble with her sleep.  She snores, and wakes up frequently during the night feeling like she can't breath.  This is sometimes relieved by inhaler therapy.  Her son reports that she stops breathing while asleep.  Tests: Spirometry 11/01/11>>FEV1 0.48 (21%), FEV1% 37 (difficult pt effort). 11/01/11 SpO2 85% on room air at rest  Past Medical History  Diagnosis Date  . Anxiety disorder   . Asthma   . COPD (chronic obstructive pulmonary disease)     Not on home O2 yet but is prescribed.  . Headache   . Hyperlipemia   . Hypertension     Past Surgical History  Procedure Date  . Cosmetic surgery   . Cystectomy   . Hand surgery     Current Outpatient Prescriptions on File Prior to Visit  Medication Sig Dispense Refill  . albuterol (PROVENTIL HFA;VENTOLIN HFA) 108 (90 BASE) MCG/ACT inhaler Inhale 2 puffs into the lungs every 4 (four) hours as needed. For shortness of breath.      Marland Kitchen albuterol (PROVENTIL) (5 MG/ML) 0.5% nebulizer solution Take 0.5 mLs (2.5 mg total) by nebulization every 4 (four) hours.  40 mL  1  . Calcium Carbonate-Vitamin D  (CALCIUM-VITAMIN D) 500-200 MG-UNIT per tablet Take 1 tablet by mouth daily.      Marland Kitchen doxepin (SINEQUAN) 25 MG capsule Take 25 mg by mouth daily as needed.       . Fluticasone-Salmeterol (ADVAIR) 500-50 MCG/DOSE AEPB Inhale 1 puff into the lungs every 12 (twelve) hours.  60 each  3  . Ipratropium-Albuterol (COMBIVENT RESPIMAT) 20-100 MCG/ACT AERS respimat Inhale 1 puff into the lungs every 6 (six) hours.  1 Inhaler  4  . Nutritional Supplements (ENSURE COMPLETE SHAKE) LIQD Take 1 Bottle by mouth 3 (three) times daily between meals.      . simvastatin (ZOCOR) 20 MG tablet Take 1 tablet (20 mg total) by mouth every evening.  30 tablet  5  . Valsartan (DIOVAN PO) Take 1 tablet by mouth daily. Unsure about dosage      . venlafaxine XR (EFFEXOR-XR) 75 MG 24 hr capsule Take 75 mg by mouth daily. Taken with 150mg  cap to equal 225mg  daily.        Allergies  Allergen Reactions  . Lexapro (Escitalopram)     Gi upset  . Naproxen     High BP  . Skelaxin (Metaxalone)      Physical Exam: Filed Vitals:   03/15/12 1521  Temp: 97.5 F (36.4 C)  TempSrc: Oral  Height: 5\' 2"  (1.575 m)  Weight: 151 lb 3.2 oz (68.584 kg)   Wt Readings from Last 3 Encounters:  03/15/12 151 lb 3.2 oz (68.584 kg)  12/27/11 155 lb (70.308 kg)  12/07/11 153 lb 6.4 oz (69.582 kg)    Body mass index is 27.65 kg/(m^2).   General - No distress, wearing oxygen ENT - No sinus tenderness, no oral exudate, no LAN Cardiac - s1s2 regular, no murmur Chest - prolonged exhalation, decreased breath sounds, no wheeze/rales Back - no focal tenderness Abd - soft, non-tender Ext - no edema, deformity right hand Neuro - normal strength Skin - no rashes Psych - normal mood, and behavior.     Assessment/Plan:  [name], MD [facility]/Critical [facility]/Sleep Pager:  [phone] 03/15/2012, 3:27 PM

## 2012-03-16 ENCOUNTER — Encounter: Payer: Self-pay | Admitting: Pulmonary Disease

## 2012-03-16 DIAGNOSIS — R0683 Snoring: Secondary | ICD-10-CM | POA: Insufficient documentation

## 2012-03-16 DIAGNOSIS — R0981 Nasal congestion: Secondary | ICD-10-CM | POA: Insufficient documentation

## 2012-03-16 NOTE — Assessment & Plan Note (Signed)
She reports snoring, sleep disruption, witnessed apnea, and daytime sleepiness.  She has history of COPD, HTN, and mood disorder.  I am concerned she could have sleep apnea.  I have explained how sleep apnea can affect the patient's health.  Driving precautions and importance of weight loss were discussed.  Treatment options for sleep apnea were reviewed.  She will need in lab sleep study to further assess.  She is concerned about expense of testing at this time, and would prefer to wait until her medicaid application is approved before scheduling test.

## 2012-03-16 NOTE — Assessment & Plan Note (Addendum)
Will have her try dulera in place of advair.  I have given her sample for this.  She is to continue combivent as needed.  Advised her to look up financial assistance options for inhalers on pharmacy company websites.  Will need to consider referral to pulmonary rehab, and testing for A1AT levels when her medicaid application is approved.

## 2012-03-16 NOTE — Assessment & Plan Note (Addendum)
She continues to smoke.  Discussed options to helping with smoking cessation.     

## 2012-03-16 NOTE — Assessment & Plan Note (Signed)
Advised her to try using nasal irrigation.  If this is unsuccessful, then she may need to use nasal steroids.

## 2012-03-16 NOTE — Assessment & Plan Note (Signed)
Continue 2 liters at rest, 3 liters with exertion and sleep.  Will re-assess her oxygen needs at next visit.

## 2012-04-08 ENCOUNTER — Telehealth: Payer: Self-pay | Admitting: Pulmonary Disease

## 2012-04-08 ENCOUNTER — Ambulatory Visit (INDEPENDENT_AMBULATORY_CARE_PROVIDER_SITE_OTHER): Payer: Self-pay | Admitting: Family

## 2012-04-08 ENCOUNTER — Encounter: Payer: Self-pay | Admitting: Family

## 2012-04-08 VITALS — BP 124/86 | HR 97 | Wt 151.0 lb

## 2012-04-08 DIAGNOSIS — G43909 Migraine, unspecified, not intractable, without status migrainosus: Secondary | ICD-10-CM

## 2012-04-08 DIAGNOSIS — F32A Depression, unspecified: Secondary | ICD-10-CM

## 2012-04-08 DIAGNOSIS — J449 Chronic obstructive pulmonary disease, unspecified: Secondary | ICD-10-CM

## 2012-04-08 DIAGNOSIS — E7800 Pure hypercholesterolemia, unspecified: Secondary | ICD-10-CM

## 2012-04-08 DIAGNOSIS — I10 Essential (primary) hypertension: Secondary | ICD-10-CM

## 2012-04-08 DIAGNOSIS — F329 Major depressive disorder, single episode, unspecified: Secondary | ICD-10-CM

## 2012-04-08 MED ORDER — VENLAFAXINE HCL ER 75 MG PO CP24: 75.0000 mg | ORAL_CAPSULE | Freq: Every day | ORAL | Status: DC

## 2012-04-08 MED ORDER — VENLAFAXINE HCL ER 150 MG PO CP24: 150.0000 mg | ORAL_CAPSULE | Freq: Every day | ORAL | Status: DC

## 2012-04-08 MED ORDER — CLONAZEPAM 1 MG PO TABS: 1.0000 mg | ORAL_TABLET | Freq: Two times a day (BID) | ORAL | Status: DC | PRN

## 2012-04-08 MED ORDER — VALSARTAN-HYDROCHLOROTHIAZIDE 160-25 MG PO TABS: 1.0000 | ORAL_TABLET | Freq: Every day | ORAL | Status: DC

## 2012-04-08 NOTE — Patient Instructions (Addendum)
Smoking Cessation Quitting smoking is important to your health and has many advantages. However, it is not always easy to quit since nicotine is a very addictive drug. Often times, people try 3 times or more before being able to quit. This document explains the best ways for you to prepare to quit smoking. Quitting takes hard work and a lot of effort, but you can do it. ADVANTAGES OF QUITTING SMOKING  You will live longer, feel better, and live better.  Your body will feel the impact of quitting smoking almost immediately.  Within 20 minutes, blood pressure decreases. Your pulse returns to its normal level.  After 8 hours, carbon monoxide levels in the blood return to normal. Your oxygen level increases.  After 24 hours, the chance of having a heart attack starts to decrease. Your breath, hair, and body stop smelling like smoke.  After 48 hours, damaged nerve endings begin to recover. Your sense of taste and smell improve.  After 72 hours, the body is virtually free of nicotine. Your bronchial tubes relax and breathing becomes easier.  After 2 to 12 weeks, lungs can hold more air. Exercise becomes easier and circulation improves.  The risk of having a heart attack, stroke, cancer, or lung disease is greatly reduced.  After 1 year, the risk of coronary heart disease is cut in half.  After 5 years, the risk of stroke falls to the same as a nonsmoker.  After 10 years, the risk of lung cancer is cut in half and the risk of other cancers decreases significantly.  After 15 years, the risk of coronary heart disease drops, usually to the level of a nonsmoker.  If you are pregnant, quitting smoking will improve your chances of having a healthy baby.  The people you live with, especially any children, will be healthier.  You will have extra money to spend on things other than cigarettes. QUESTIONS TO THINK ABOUT BEFORE ATTEMPTING TO QUIT You may want to talk about your answers with your  caregiver.  Why do you want to quit?  If you tried to quit in the past, what helped and what did not?  What will be the most difficult situations for you after you quit? How will you plan to handle them?  Who can help you through the tough times? Your family? Friends? A caregiver?  What pleasures do you get from smoking? What ways can you still get pleasure if you quit? Here are some questions to ask your caregiver:  How can you help me to be successful at quitting?  What medicine do you think would be best for me and how should I take it?  What should I do if I need more help?  What is smoking withdrawal like? How can I get information on withdrawal? GET READY  Set a quit date.  Change your environment by getting rid of all cigarettes, ashtrays, matches, and lighters in your home, car, or work. Do not let people smoke in your home.  Review your past attempts to quit. Think about what worked and what did not. GET SUPPORT AND ENCOURAGEMENT You have a better chance of being successful if you have help. You can get support in many ways.  Tell your family, friends, and co-workers that you are going to quit and need their support. Ask them not to smoke around you.  Get individual, group, or telephone counseling and support. Programs are available at local hospitals and health centers. Call your local health department for   information about programs in your area.  Spiritual beliefs and practices may help some smokers quit.  Download a "quit meter" on your computer to keep track of quit statistics, such as how long you have gone without smoking, cigarettes not smoked, and money saved.  Get a self-help book about quitting smoking and staying off of tobacco. LEARN NEW SKILLS AND BEHAVIORS  Distract yourself from urges to smoke. Talk to someone, go for a walk, or occupy your time with a task.  Change your normal routine. Take a different route to work. Drink tea instead of coffee.  Eat breakfast in a different place.  Reduce your stress. Take a hot bath, exercise, or read a book.  Plan something enjoyable to do every day. Reward yourself for not smoking.  Explore interactive web-based programs that specialize in helping you quit. GET MEDICINE AND USE IT CORRECTLY Medicines can help you stop smoking and decrease the urge to smoke. Combining medicine with the above behavioral methods and support can greatly increase your chances of successfully quitting smoking.  Nicotine replacement therapy helps deliver nicotine to your body without the negative effects and risks of smoking. Nicotine replacement therapy includes nicotine gum, lozenges, inhalers, nasal sprays, and skin patches. Some may be available over-the-counter and others require a prescription.  Antidepressant medicine helps people abstain from smoking, but how this works is unknown. This medicine is available by prescription.  Nicotinic receptor partial agonist medicine simulates the effect of nicotine in your brain. This medicine is available by prescription. Ask your caregiver for advice about which medicines to use and how to use them based on your health history. Your caregiver will tell you what side effects to look out for if you choose to be on a medicine or therapy. Carefully read the information on the package. Do not use any other product containing nicotine while using a nicotine replacement product.  RELAPSE OR DIFFICULT SITUATIONS Most relapses occur within the first 3 months after quitting. Do not be discouraged if you start smoking again. Remember, most people try several times before finally quitting. You may have symptoms of withdrawal because your body is used to nicotine. You may crave cigarettes, be irritable, feel very hungry, cough often, get headaches, or have difficulty concentrating. The withdrawal symptoms are only temporary. They are strongest when you first quit, but they will go away within  10 14 days. To reduce the chances of relapse, try to:  Avoid drinking alcohol. Drinking lowers your chances of successfully quitting.  Reduce the amount of caffeine you consume. Once you quit smoking, the amount of caffeine in your body increases and can give you symptoms, such as a rapid heartbeat, sweating, and anxiety.  Avoid smokers because they can make you want to smoke.  Do not let weight gain distract you. Many smokers will gain weight when they quit, usually less than 10 pounds. Eat a healthy diet and stay active. You can always lose the weight gained after you quit.  Find ways to improve your mood other than smoking. FOR MORE INFORMATION  www.smokefree.gov  Document Released: 02/07/2001 Document Revised: 08/15/2011 Document Reviewed: 05/25/2011 [employer] Patient Information 2013 [employer].  

## 2012-04-08 NOTE — Telephone Encounter (Signed)
[email or web address] x1 --pt needs to get this filled by PCP.

## 2012-04-08 NOTE — Telephone Encounter (Signed)
(  continued)  Pt then went on questioning the reasoning behind her having to find a primary care doctor to write her prescriptions vs the cost of when [facility] writes her prescriptions to the cost of TP writing her prescriptions.  I reminded the pt that she does have an appt today w/ [facility] @ [place].  Pt may or may not keep this appointment...  Alexa Stephens

## 2012-04-08 NOTE — Telephone Encounter (Signed)
Error.Alexa Stephens ° °

## 2012-04-08 NOTE — Progress Notes (Signed)
Subjective:    Patient ID: Alexa Stephens, female    DOB: [birth year], 62 y.o.   MRN: [id number]  HPI 62 year old new patient, smoker, is in to be established with a history of depression, tobacco abuse, anxiety, COPD, hyperlipidemia, and hypertension. Reports doing well. Continues to smoke 1/4 ppd of cigarettes. She has been a patient at [facility]. She sees pulmonology routinely. She is currently on 3 liter of O2 at rest and 4liter [place] with activity. Has a history of Migraine headaches and takes Fioricet.    Review of Systems  Constitutional: Negative.   HENT: Negative.   Respiratory: Positive for cough and shortness of breath.   Cardiovascular: Negative.   Gastrointestinal: Negative.   Genitourinary: Negative.   Musculoskeletal: Negative.   Skin: Negative.   Neurological: Negative.   Hematological: Negative.   Psychiatric/Behavioral: Negative.    Past Medical History  Diagnosis Date  . Anxiety disorder   . Asthma   . COPD (chronic obstructive pulmonary disease)     Not on home O2 yet but is prescribed.  . Headache   . Hyperlipemia   . Hypertension   . Pneumonia Sept 2013  . Sepsis Sept 2013    History   Social History  . Marital Status: Single    Spouse Name: N/A    Number of Children: N/A  . Years of Education: N/A   Occupational History  . retired    Social History Main Topics  . Smoking status: Current Every Day Smoker -- 40 years    Types: Cigarettes  . Smokeless tobacco: Not on file     Comment: 1 pack per week  . Alcohol Use: No  . Drug Use: No  . Sexually Active: Not on file   Other Topics Concern  . Not on file   Social History Narrative  . No narrative on file    Past Surgical History  Procedure Laterality Date  . Cosmetic surgery    . Cystectomy    . Hand surgery      Family History  Problem Relation Age of Onset  . Lung cancer Maternal Grandfather   . Cancer Maternal Grandmother     Allergies  Allergen Reactions  . Lexapro  (Escitalopram)     Gi upset  . Naproxen     High BP  . Skelaxin [facility])     Current Outpatient Prescriptions on File Prior to Visit  Medication Sig Dispense Refill  . albuterol (PROVENTIL HFA;VENTOLIN HFA) 108 (90 BASE) MCG/ACT inhaler Inhale 2 puffs into the lungs every 4 (four) hours as needed. For shortness of breath.      Marland Kitchen albuterol (PROVENTIL) (5 MG/ML) 0.5% nebulizer solution Take 0.5 mLs (2.5 mg total) by nebulization every 4 (four) hours.  40 mL  1  . aspirin 81 MG tablet Take 81 mg by mouth daily as needed.      . Calcium Carbonate-Vitamin D (CALCIUM-VITAMIN D) 500-200 MG-UNIT per tablet Take 1 tablet by mouth daily.      . Cholecalciferol (VITAMIN D) 2000 UNITS CAPS Take by mouth daily.      Marland Kitchen doxepin (SINEQUAN) 25 MG capsule Take 25 mg by mouth daily as needed.       . Ipratropium-Albuterol (COMBIVENT RESPIMAT) 20-100 MCG/ACT AERS respimat Inhale 1 puff into the lungs every 6 (six) hours.  1 Inhaler  4  . mometasone-formoterol (DULERA) 100-5 MCG/ACT AERO Inhale 2 puffs into the lungs 2 (two) times daily.  1 Inhaler  5  . Multiple Vitamin (  MULTIVITAMIN) tablet Take 1 tablet by mouth daily.      . Nutritional Supplements (ENSURE COMPLETE SHAKE) LIQD Take 1 Bottle by mouth 3 (three) times daily between meals.      . simvastatin (ZOCOR) 20 MG tablet Take 1 tablet (20 mg total) by mouth every evening.  30 tablet  5  . vitamin C (ASCORBIC ACID) 500 MG tablet Take 500 mg by mouth daily.       No current facility-administered medications on file prior to visit.    BP 124/86  Pulse 97  Wt 151 lb (68.493 kg)  BMI 27.61 kg/m2  SpO2 93%chart    Objective:   Physical Exam  Constitutional: She is oriented to person, place, and time. She appears well-developed and well-nourished.  HENT:  Right Ear: External ear normal.  Left Ear: External ear normal.  Nose: Nose normal.  Mouth/Throat: Oropharynx is clear and moist.  Neck: Normal range of motion. Neck supple.  Cardiovascular:  Normal rate, regular rhythm and normal heart sounds.   Pulmonary/Chest: Effort normal. She has wheezes.  Abdominal: Soft. Bowel sounds are normal.  Musculoskeletal: Normal range of motion.  Neurological: She is alert and oriented to person, place, and time.  Skin: Skin is warm and dry.  Psychiatric: She has a normal mood and affect.          Assessment & Plan:  Assessment:  1. COPD 2. Hypertension 3. Depression 4. Tobacco abuse 5. Anxiety 6. Migraine Headaches  Plan: Strongly encouraged smoking cessation. I have advised patient that I will not prescribe long-term fioricet for migraine headaches. I have recommended other options. Other medications refilled. Obtain records from [facility]. Labs at next OV since she reports having labs recently. I will add anything necessary at that time.

## 2012-04-09 ENCOUNTER — Encounter: Payer: Self-pay | Admitting: Family

## 2012-04-09 DIAGNOSIS — G43909 Migraine, unspecified, not intractable, without status migrainosus: Secondary | ICD-10-CM | POA: Insufficient documentation

## 2012-04-09 NOTE — Telephone Encounter (Signed)
LMTCB

## 2012-04-10 ENCOUNTER — Ambulatory Visit: Payer: Self-pay | Admitting: Pulmonary Disease

## 2012-04-10 NOTE — Telephone Encounter (Signed)
Pt states she has already had this taken care of through her PCP.  Nothing further is needed at this time.

## 2012-05-31 ENCOUNTER — Telehealth: Payer: Self-pay | Admitting: Pulmonary Disease

## 2012-05-31 MED ORDER — IPRATROPIUM-ALBUTEROL 20-100 MCG/ACT IN AERS: 1.0000 | INHALATION_SPRAY | Freq: Four times a day (QID) | RESPIRATORY_TRACT | Status: DC

## 2012-05-31 NOTE — Telephone Encounter (Signed)
Called and spoke with pt and she is aware rx for the combivent has been sent in to her pharmacy.  Pt stated that she would like recs from VS about having the cataract surgery at this time.  She stated that she asked several months ago and was told that she should wait.  VS please advise. Thanks  Allergies  Allergen Reactions  . Lexapro (Escitalopram)     Gi upset  . Naproxen     High BP  . Skelaxin (Metaxalone)

## 2012-05-31 NOTE — Telephone Encounter (Signed)
lmomtcb x1 for pt on home # Called cell # not able to take calls at this time [facility]

## 2012-05-31 NOTE — Telephone Encounter (Signed)
Patient returning call.

## 2012-06-03 NOTE — Telephone Encounter (Signed)
She would need to have evaluation in office first before getting approval to have cataract surgery. She is due for ROV in May 2014 anyway.  Please schedule her for ROV to discuss further.

## 2012-06-03 NOTE — Telephone Encounter (Signed)
ATC pt NA wcb Called cell pt not accepting calls at this time wcb

## 2012-06-04 NOTE — Telephone Encounter (Signed)
ATC home number-fast busy tone ATC cell and msg states caller not accepting calls at this time [facility]

## 2012-06-05 ENCOUNTER — Ambulatory Visit (INDEPENDENT_AMBULATORY_CARE_PROVIDER_SITE_OTHER): Payer: Medicare Other | Admitting: Family

## 2012-06-05 VITALS — BP 126/84 | HR 94 | Temp 97.9°F | Resp 20 | Ht 62.0 in | Wt 144.0 lb

## 2012-06-05 DIAGNOSIS — J449 Chronic obstructive pulmonary disease, unspecified: Secondary | ICD-10-CM

## 2012-06-05 DIAGNOSIS — H269 Unspecified cataract: Secondary | ICD-10-CM

## 2012-06-05 DIAGNOSIS — F329 Major depressive disorder, single episode, unspecified: Secondary | ICD-10-CM

## 2012-06-05 DIAGNOSIS — M25512 Pain in left shoulder: Secondary | ICD-10-CM

## 2012-06-05 DIAGNOSIS — M25519 Pain in unspecified shoulder: Secondary | ICD-10-CM

## 2012-06-05 DIAGNOSIS — E7800 Pure hypercholesterolemia, unspecified: Secondary | ICD-10-CM

## 2012-06-05 DIAGNOSIS — L84 Corns and callosities: Secondary | ICD-10-CM

## 2012-06-05 DIAGNOSIS — I10 Essential (primary) hypertension: Secondary | ICD-10-CM

## 2012-06-05 MED ORDER — AMOXICILLIN 500 MG PO TABS: 1000.0000 mg | ORAL_TABLET | Freq: Two times a day (BID) | ORAL | Status: AC

## 2012-06-05 MED ORDER — TRAMADOL HCL 50 MG PO TABS: 50.0000 mg | ORAL_TABLET | Freq: Three times a day (TID) | ORAL | Status: DC | PRN

## 2012-06-05 NOTE — Patient Instructions (Addendum)
Smoking Cessation Quitting smoking is important to your health and has many advantages. However, it is not always easy to quit since nicotine is a very addictive drug. Often times, people try 3 times or more before being able to quit. This document explains the best ways for you to prepare to quit smoking. Quitting takes hard work and a lot of effort, but you can do it. ADVANTAGES OF QUITTING SMOKING  You will live longer, feel better, and live better.  Your body will feel the impact of quitting smoking almost immediately.  Within 20 minutes, blood pressure decreases. Your pulse returns to its normal level.  After 8 hours, carbon monoxide levels in the blood return to normal. Your oxygen level increases.  After 24 hours, the chance of having a heart attack starts to decrease. Your breath, hair, and body stop smelling like smoke.  After 48 hours, damaged nerve endings begin to recover. Your sense of taste and smell improve.  After 72 hours, the body is virtually free of nicotine. Your bronchial tubes relax and breathing becomes easier.  After 2 to 12 weeks, lungs can hold more air. Exercise becomes easier and circulation improves.  The risk of having a heart attack, stroke, cancer, or lung disease is greatly reduced.  After 1 year, the risk of coronary heart disease is cut in half.  After 5 years, the risk of stroke falls to the same as a nonsmoker.  After 10 years, the risk of lung cancer is cut in half and the risk of other cancers decreases significantly.  After 15 years, the risk of coronary heart disease drops, usually to the level of a nonsmoker.  If you are pregnant, quitting smoking will improve your chances of having a healthy baby.  The people you live with, especially any children, will be healthier.  You will have extra money to spend on things other than cigarettes. QUESTIONS TO THINK ABOUT BEFORE ATTEMPTING TO QUIT You may want to talk about your answers with your  caregiver.  Why do you want to quit?  If you tried to quit in the past, what helped and what did not?  What will be the most difficult situations for you after you quit? How will you plan to handle them?  Who can help you through the tough times? Your family? Friends? A caregiver?  What pleasures do you get from smoking? What ways can you still get pleasure if you quit? Here are some questions to ask your caregiver:  How can you help me to be successful at quitting?  What medicine do you think would be best for me and how should I take it?  What should I do if I need more help?  What is smoking withdrawal like? How can I get information on withdrawal? GET READY  Set a quit date.  Change your environment by getting rid of all cigarettes, ashtrays, matches, and lighters in your home, car, or work. Do not let people smoke in your home.  Review your past attempts to quit. Think about what worked and what did not. GET SUPPORT AND ENCOURAGEMENT You have a better chance of being successful if you have help. You can get support in many ways.  Tell your family, friends, and co-workers that you are going to quit and need their support. Ask them not to smoke around you.  Get individual, group, or telephone counseling and support. Programs are available at local hospitals and health centers. Call your local health department for   information about programs in your area.  Spiritual beliefs and practices may help some smokers quit.  Download a "quit meter" on your computer to keep track of quit statistics, such as how long you have gone without smoking, cigarettes not smoked, and money saved.  Get a self-help book about quitting smoking and staying off of tobacco. LEARN NEW SKILLS AND BEHAVIORS  Distract yourself from urges to smoke. Talk to someone, go for a walk, or occupy your time with a task.  Change your normal routine. Take a different route to work. Drink tea instead of coffee.  Eat breakfast in a different place.  Reduce your stress. Take a hot bath, exercise, or read a book.  Plan something enjoyable to do every day. Reward yourself for not smoking.  Explore interactive web-based programs that specialize in helping you quit. GET MEDICINE AND USE IT CORRECTLY Medicines can help you stop smoking and decrease the urge to smoke. Combining medicine with the above behavioral methods and support can greatly increase your chances of successfully quitting smoking.  Nicotine replacement therapy helps deliver nicotine to your body without the negative effects and risks of smoking. Nicotine replacement therapy includes nicotine gum, lozenges, inhalers, nasal sprays, and skin patches. Some may be available over-the-counter and others require a prescription.  Antidepressant medicine helps people abstain from smoking, but how this works is unknown. This medicine is available by prescription.  Nicotinic receptor partial agonist medicine simulates the effect of nicotine in your brain. This medicine is available by prescription. Ask your caregiver for advice about which medicines to use and how to use them based on your health history. Your caregiver will tell you what side effects to look out for if you choose to be on a medicine or therapy. Carefully read the information on the package. Do not use any other product containing nicotine while using a nicotine replacement product.  RELAPSE OR DIFFICULT SITUATIONS Most relapses occur within the first 3 months after quitting. Do not be discouraged if you start smoking again. Remember, most people try several times before finally quitting. You may have symptoms of withdrawal because your body is used to nicotine. You may crave cigarettes, be irritable, feel very hungry, cough often, get headaches, or have difficulty concentrating. The withdrawal symptoms are only temporary. They are strongest when you first quit, but they will go away within  10 14 days. To reduce the chances of relapse, try to:  Avoid drinking alcohol. Drinking lowers your chances of successfully quitting.  Reduce the amount of caffeine you consume. Once you quit smoking, the amount of caffeine in your body increases and can give you symptoms, such as a rapid heartbeat, sweating, and anxiety.  Avoid smokers because they can make you want to smoke.  Do not let weight gain distract you. Many smokers will gain weight when they quit, usually less than 10 pounds. Eat a healthy diet and stay active. You can always lose the weight gained after you quit.  Find ways to improve your mood other than smoking. FOR MORE INFORMATION  www.smokefree.gov  Document Released: 02/07/2001 Document Revised: 08/15/2011 Document Reviewed: 05/25/2011 [employer] Patient Information 2013 [employer].  

## 2012-06-05 NOTE — Telephone Encounter (Signed)
ATC pt's home # - received fast busy tone Called cell # - lmomtcb

## 2012-06-05 NOTE — Progress Notes (Signed)
  Subjective:    Patient ID: Alexa Stephens, female    DOB: Jul 20, 1950, 62 y.o.   MRN: [id number]  HPI 62 year old white female, smoker, is in for recheck of COPD, hypertension, hyperlipidemia. She reports discontinue and tobacco usage approximately 3-4 weeks ago and has been using an electronic cigarette and tolerating it well. She's tolerating all of her medications well. Continues to complain of chronic pain reports have been left shoulder pain related to her humeral fracture several years ago but she never had repaired due to cost. She would like a referral to orthopedics. Requesting pain medication.  Patient also requesting a referral to podiatry for callus on her right great toe. Reports periodically having to have a cat which removed through podiatry. Mild pain. No drainage or discharge.  Patient has complaints of sneezing, cough, sinus pressure and pain ongoing x3-4 weeks. She has not been taken any medication for relief. Reports a history of sinus infections in the past requiring antibiotic therapy.  Patient also has concerns of cataracts bilaterally that have been ongoing for years and worsening. Reports not being able to see much out of her left eye at all at this point in her right eye is becoming bad as well. However, she's been unable to get clear for surgery due to her chronic health conditions.   Review of Systems  Constitutional: Negative.   HENT: Positive for congestion, postnasal drip and sinus pressure.   Respiratory: Negative.   Cardiovascular: Negative.  Negative for chest pain, palpitations and leg swelling.  Gastrointestinal: Negative.   Endocrine: Negative.   Genitourinary: Negative.   Musculoskeletal: Positive for back pain and arthralgias. Negative for myalgias and gait problem.       Left shoulder pain   Skin: Negative.   Allergic/Immunologic: Negative.   Neurological: Negative.   Hematological: Negative.   Psychiatric/Behavioral: Negative.        Objective:   Physical Exam  Constitutional: She is oriented to person, place, and time. She appears well-developed and well-nourished.  HENT:  Right Ear: External ear normal.  Left Ear: External ear normal.  Nose: Nose normal.  Mouth/Throat: Oropharynx is clear and moist.  Maxillary sinus tenderness to palpation  Neck: Normal range of motion. Neck supple.  Cardiovascular: Normal rate, regular rhythm and normal heart sounds.   Pulmonary/Chest: Effort normal and breath sounds normal.  Abdominal: Soft. Bowel sounds are normal.  Musculoskeletal: She exhibits tenderness. She exhibits no edema.  Tenderness to palpation of the left shoulder. Decreased ROM with abduction.   Neurological: She is alert and oriented to person, place, and time.  Skin: Skin is warm and dry.  Callus noted to the right great toe.   Psychiatric: She has a normal mood and affect.          Assessment & Plan:  Assessment:  1. Hypertension 2. Hyperlipidemia 3. COPD 4. Tobacco abuse 5. Bilateral cataracts 6. Callus right great toe 7. Sinusitis  Plan: Continue current meds. Refer to Podiatry and orthopedics. Advised patient to see the eye doctor to have cataract removed. Amoxil for sinusitis. Continue to abstain from cigarettes. Tramadol for pain. Consider pain clinic referral is she requires stronger analgesia.

## 2012-06-06 ENCOUNTER — Encounter: Payer: Self-pay | Admitting: Family

## 2012-06-06 LAB — CBC WITH DIFFERENTIAL/PLATELET
Basophils Absolute: 0 10*3/uL (ref 0.0–0.1)
Hemoglobin: 11.9 g/dL — ABNORMAL LOW (ref 12.0–15.0)
Lymphocytes Relative: 27.8 % (ref 12.0–46.0)
Monocytes Relative: 6.3 % (ref 3.0–12.0)
Neutro Abs: 4.2 10*3/uL (ref 1.4–7.7)
Neutrophils Relative %: 63.9 % (ref 43.0–77.0)
RBC: 3.7 Mil/uL — ABNORMAL LOW (ref 3.87–5.11)
RDW: 13.6 % (ref 11.5–14.6)

## 2012-06-06 LAB — HEPATIC FUNCTION PANEL
ALT: 18 U/L (ref 0–35)
AST: 23 U/L (ref 0–37)
Albumin: 4 g/dL (ref 3.5–5.2)

## 2012-06-06 LAB — BASIC METABOLIC PANEL
BUN: 7 mg/dL (ref 6–23)
CO2: 36 mEq/L — ABNORMAL HIGH (ref 19–32)
Calcium: 9.1 mg/dL (ref 8.4–10.5)
Creatinine, Ser: 0.5 mg/dL (ref 0.4–1.2)
Glucose, Bld: 80 mg/dL (ref 70–99)

## 2012-06-06 LAB — TSH: TSH: 0.56 u[IU]/mL (ref 0.35–5.50)

## 2012-06-06 LAB — LIPID PANEL
HDL: 50.1 mg/dL (ref >39.00)
Triglycerides: 77 mg/dL (ref 0.0–149.0)
VLDL: 15.4 mg/dL (ref 0.0–40.0)

## 2012-06-06 NOTE — Telephone Encounter (Signed)
atc pts home number--received fast busy tone.  Called cell and lmomtcb.

## 2012-06-07 NOTE — Telephone Encounter (Signed)
ATC home number, line busy. LMTCB on cell number. ATC emergency contact number but thi sis disconnected. Carron Curie, CMA

## 2012-06-10 NOTE — Telephone Encounter (Signed)
Pt advised and appt set. Alexa Stephens, CMA  

## 2012-06-10 NOTE — Telephone Encounter (Signed)
ATC home # - Unable to leave message LM on cell number to call back

## 2012-06-12 ENCOUNTER — Telehealth: Payer: Self-pay | Admitting: Family

## 2012-06-12 NOTE — Telephone Encounter (Signed)
Klonopin and Effexor have 2 different mechanisms of action. They are not the same. If she would like to discontinue Klonopin, that is fine because its an as needed only medication. Reduce to 1/2 tablet x 2 weeks as needed then stop. Anything further, I would be glad to see her in the office to discuss medications and anxiety

## 2012-06-12 NOTE — Telephone Encounter (Signed)
Patient calls requesting medication refills and then realizes that she has refills. RN instructed that in the future when medications need renewing, to just have pharmacy call for her.  However, additions to this, she is requesting Dr. Orvan Falconer to review her medications for her nervousness.  She declined triage stating that emotionally she is where she has been, but would like her medications to be consolidated to one if there is one that would work.  She notes that the generic for Klonopen works but makes her fuzzy through the day.  The generic for Effexor is also effective.  She was wondering if there was something that would work for her as these do, but only require one tablet instead of two different ones. Please follow up with her regarding this.  Also note that she uses the [facility] at [address] at [phone].

## 2012-06-12 NOTE — Telephone Encounter (Signed)
Please advise 

## 2012-06-12 NOTE — Telephone Encounter (Signed)
Pt aware and will call at another time to schedule and appointment

## 2012-06-21 ENCOUNTER — Other Ambulatory Visit: Payer: Medicare Other

## 2012-06-21 ENCOUNTER — Encounter: Payer: Self-pay | Admitting: Pulmonary Disease

## 2012-06-21 ENCOUNTER — Ambulatory Visit (INDEPENDENT_AMBULATORY_CARE_PROVIDER_SITE_OTHER): Payer: Medicare Other | Admitting: Pulmonary Disease

## 2012-06-21 VITALS — Temp 97.8°F | Ht 62.0 in | Wt 142.0 lb

## 2012-06-21 DIAGNOSIS — R0902 Hypoxemia: Secondary | ICD-10-CM

## 2012-06-21 DIAGNOSIS — J438 Other emphysema: Secondary | ICD-10-CM

## 2012-06-21 DIAGNOSIS — R0981 Nasal congestion: Secondary | ICD-10-CM

## 2012-06-21 DIAGNOSIS — J9610 Chronic respiratory failure, unspecified whether with hypoxia or hypercapnia: Secondary | ICD-10-CM

## 2012-06-21 DIAGNOSIS — R0683 Snoring: Secondary | ICD-10-CM

## 2012-06-21 DIAGNOSIS — J449 Chronic obstructive pulmonary disease, unspecified: Secondary | ICD-10-CM

## 2012-06-21 DIAGNOSIS — Z01811 Encounter for preprocedural respiratory examination: Secondary | ICD-10-CM | POA: Insufficient documentation

## 2012-06-21 DIAGNOSIS — J3489 Other specified disorders of nose and nasal sinuses: Secondary | ICD-10-CM

## 2012-06-21 DIAGNOSIS — J439 Emphysema, unspecified: Secondary | ICD-10-CM

## 2012-06-21 DIAGNOSIS — R0989 Other specified symptoms and signs involving the circulatory and respiratory systems: Secondary | ICD-10-CM

## 2012-06-21 DIAGNOSIS — J9611 Chronic respiratory failure with hypoxia: Secondary | ICD-10-CM

## 2012-06-21 MED ORDER — IPRATROPIUM BROMIDE 0.02 % IN SOLN: 500.0000 ug | Freq: Four times a day (QID) | RESPIRATORY_TRACT | Status: DC

## 2012-06-21 NOTE — Progress Notes (Signed)
Chief Complaint  Patient presents with  . Follow-up    Pt is needing surgical clearance for cataract surgery. Pt states her breathing is okay. Has very little cough d/t drainage from sinuses.  Denies anywheezing, and no chest tx. Pt currently on abx from PCP for sinus infection    CC: Alexa Stephens  History of Present Illness: Alexa Stephens is a 62 y.o. female smoker with severe COPD, hypoxia, and snoring.  She is here to assess her pulmonary status prior to cataract surgery with Dr. Dione Booze.  She is not having cough, wheeze, or sputum production.  She developed a sinus infection, but this is getting better with antibiotics.  She still has snoring and sleep disruption.   She denies chest pain or leg swelling.  She is trying to increase her exercise tolerance by walking on a treadmill at home.  She still has trouble affording her medications.  She has medicare, but not part D.   Tests: Spirometry 11/01/11>>FEV1 0.48 (21%), FEV1% 37 (difficult pt effort). 11/01/11 SpO2 85% on room air at rest   She  has a past medical history of Anxiety disorder; Asthma; COPD (chronic obstructive pulmonary disease); Headache; Hyperlipemia; Hypertension; Pneumonia (Sept 2013); and Sepsis (Sept 2013).  She  has past surgical history that includes Cosmetic surgery; Cystectomy; and Hand surgery.   Current Outpatient Prescriptions on File Prior to Visit  Medication Sig Dispense Refill  . albuterol (PROVENTIL HFA;VENTOLIN HFA) 108 (90 BASE) MCG/ACT inhaler Inhale 2 puffs into the lungs every 4 (four) hours as needed. For shortness of breath.      Marland Kitchen albuterol (PROVENTIL) (5 MG/ML) 0.5% nebulizer solution Take 0.5 mLs (2.5 mg total) by nebulization every 4 (four) hours.  40 mL  1  . aspirin 81 MG tablet Take 81 mg by mouth daily as needed.      . Calcium Carbonate-Vitamin D (CALCIUM-VITAMIN D) 500-200 MG-UNIT per tablet Take 1 tablet by mouth daily.      . Cholecalciferol (VITAMIN D) 2000 UNITS CAPS Take by  mouth daily.      . clonazePAM (KLONOPIN) 1 MG tablet Take 1 tablet (1 mg total) by mouth 2 (two) times daily as needed.  60 tablet  4  . doxepin (SINEQUAN) 25 MG capsule Take 25 mg by mouth daily as needed.       . Ipratropium-Albuterol (COMBIVENT RESPIMAT) 20-100 MCG/ACT AERS respimat Inhale 1 puff into the lungs every 6 (six) hours.  1 Inhaler  4  . Multiple Vitamin (MULTIVITAMIN) tablet Take 1 tablet by mouth daily.      . simvastatin (ZOCOR) 20 MG tablet Take 1 tablet (20 mg total) by mouth every evening.  30 tablet  5  . traMADol (ULTRAM) 50 MG tablet Take 1 tablet (50 mg total) by mouth every 8 (eight) hours as needed for pain.  60 tablet  0  . valsartan-hydrochlorothiazide (DIOVAN HCT) 160-25 MG per tablet Take 1 tablet by mouth daily.  30 tablet  4  . venlafaxine XR (EFFEXOR XR) 150 MG 24 hr capsule Take 1 capsule (150 mg total) by mouth daily.  30 capsule  4  . venlafaxine XR (EFFEXOR-XR) 75 MG 24 hr capsule Take 1 capsule (75 mg total) by mouth daily. Taken with 150mg  cap to equal 225mg  daily.  30 capsule  4  . vitamin C (ASCORBIC ACID) 500 MG tablet Take 500 mg by mouth daily.      . mometasone-formoterol (DULERA) 100-5 MCG/ACT AERO Inhale 2 puffs into the lungs 2 (  two) times daily.  1 Inhaler  5   No current facility-administered medications on file prior to visit.    Allergies  Allergen Reactions  . Lexapro (Escitalopram)     Gi upset  . Naproxen     High BP  . Skelaxin (Metaxalone)    She has a  family history includes Cancer in her maternal grandmother and Lung cancer in her maternal grandfather.  She  reports that she has been smoking Cigarettes.  She has been smoking about 0.00 packs per day for the past 40 years. She does not have any smokeless tobacco history on file. She reports that she does not drink alcohol or use illicit drugs.   Physical Exam:  General - No distress, wearing oxygen ENT - No sinus tenderness, no oral exudate, no LAN Cardiac - s1s2 regular, no  murmur Chest - prolonged exhalation, decreased breath sounds, no wheeze/rales Back - no focal tenderness Abd - soft, non-tender Ext - no edema, deformity right hand Neuro - normal strength Skin - no rashes Psych - normal mood, and behavior   12/07/2011  *RADIOLOGY REPORT*  Clinical Data: Follow up pneumonia  CHEST - 2 VIEW  Comparison: 11/17/2011  Findings: Chronic interstitial markings/emphysematous changes. Lungs otherwise clear.  Prior right lower lobe opacities have resolved. No pleural effusion or pneumothorax.  Cardiomediastinal silhouette is within normal limits.  Degenerative changes of the visualized thoracolumbar spine.  IMPRESSION: No evidence of acute cardiopulmonary disease.  Prior right lower lobe opacities have resolved.  Chronic interstitial markings/emphysematous changes.   Original Report Authenticated By: Charline Bills, M.D.    Lab Results  Component Value Date   WBC 6.5 06/05/2012   HGB 11.9* 06/05/2012   HCT 36.1 06/05/2012   MCV 97.4 06/05/2012   PLT 289.0 06/05/2012    Lab Results  Component Value Date   CREATININE 0.5 06/05/2012   BUN 7 06/05/2012   NA 137 06/05/2012   K 4.1 06/05/2012   CL 93* 06/05/2012   CO2 36* 06/05/2012    Assessment/Plan:  [name], MD [facility] Pager:  [phone] 06/21/2012, 3:22 PM

## 2012-06-21 NOTE — Assessment & Plan Note (Signed)
Explained to her proper dosing of amoxicillin.

## 2012-06-21 NOTE — Assessment & Plan Note (Signed)
She is a high, but not prohibitive, risk for cataract surgery.  She will need to continue her nebulizer regimen during the peri-operative period.  She will need close monitoring of her oxygen during the peri-operative period.

## 2012-06-21 NOTE — Assessment & Plan Note (Signed)
Continue 2 liters at rest, 3 liters with exertion and sleep.

## 2012-06-21 NOTE — Assessment & Plan Note (Signed)
She reports snoring, sleep disruption, witnessed apnea, and daytime sleepiness.  She has history of COPD, HTN, and mood disorder.  I am concerned she could have sleep apnea.  I have explained how sleep apnea can affect the patient's health.  Driving precautions and importance of weight loss were discussed.  Treatment options for sleep apnea were reviewed.  She will need in lab sleep study to further assess.

## 2012-06-21 NOTE — Patient Instructions (Signed)
Use albuterol one vial in nebulizer as needed up to four times per day Use ipratropium one vial in nebulizer as needed up to four times per day Amoxicillin 500 mg pill >> take two pills in the morning and two pills in evening until prescription finished Blood test today Will schedule sleep study Follow up in 4 months

## 2012-06-21 NOTE — Assessment & Plan Note (Signed)
She is not able to afford her medications.  Will see if using albuterol/ipratropium in nebulizer would be more affordable for her.

## 2012-06-25 LAB — ALPHA-1 ANTITRYPSIN PHENOTYPE: A-1 Antitrypsin: 140 mg/dL (ref 83–199)

## 2012-06-27 ENCOUNTER — Telehealth: Payer: Self-pay | Admitting: Pulmonary Disease

## 2012-06-27 NOTE — Telephone Encounter (Signed)
A1AT 06/21/12 >> 140, PI-MS  Will have my nurse inform pt that lab test was normal.  She does not have inherited form of emphysema.  No change to current treatment.

## 2012-06-27 NOTE — Telephone Encounter (Signed)
A 

## 2012-06-27 NOTE — Telephone Encounter (Signed)
lmtcb x1 w/ son

## 2012-06-28 NOTE — Telephone Encounter (Signed)
lmomtcb x2 for pt 

## 2012-06-28 NOTE — Telephone Encounter (Signed)
Spoke with pt and notified of results per VS She verbalized understanding and states nothing further needed

## 2012-07-04 ENCOUNTER — Telehealth: Payer: Self-pay | Admitting: Pulmonary Disease

## 2012-07-04 NOTE — Telephone Encounter (Signed)
Pt is already established with [facility] so they will not need an order to take the tank we gave her with them . They will automatically pick the tank up with her other tanks. I ATC pt on home # to advise and message stated call did not go through to try again later. I was able to [facility] on cell number. Carron Curie, CMA

## 2012-07-04 NOTE — Telephone Encounter (Signed)
ATC patient x2  First call got a quick busy signal and msg stating "your call did not go through" Second call normal busy signal [facility]

## 2012-07-05 MED ORDER — IPRATROPIUM BROMIDE 0.02 % IN SOLN: 500.0000 ug | Freq: Four times a day (QID) | RESPIRATORY_TRACT | Status: DC

## 2012-07-05 MED ORDER — ALBUTEROL SULFATE HFA 108 (90 BASE) MCG/ACT IN AERS: 2.0000 | INHALATION_SPRAY | RESPIRATORY_TRACT | Status: DC | PRN

## 2012-07-05 NOTE — Telephone Encounter (Signed)
LMTCBx2 on cell number. Shelbie Franken, CMA  

## 2012-07-05 NOTE — Telephone Encounter (Signed)
Pt is aware. Pt also mentioned she needs refill on ventolin inhaler and atrovent neb med [facility] [address], changed pharmacies, refill sent. Carron Curie, CMA

## 2012-07-05 NOTE — Telephone Encounter (Signed)
tcb x1

## 2012-07-05 NOTE — Telephone Encounter (Signed)
Patient returning call. [phone]

## 2012-07-29 ENCOUNTER — Telehealth: Payer: Self-pay | Admitting: Pulmonary Disease

## 2012-07-29 NOTE — Telephone Encounter (Signed)
ATC pt NA wcb 

## 2012-07-30 NOTE — Telephone Encounter (Signed)
ATC pt at # provided above -- received msg "the call did not go through. Pls try your call again."  I dialed # again x 2 receiving same msg. I ATC pt's cell # [phone] -- received msg that the person is not accepting calls at this time. WCB

## 2012-07-31 NOTE — Telephone Encounter (Signed)
ATC # provided was advised my call did not go through to try my call again. I did this x 3.  Called mobile # received message not receiving calls at this time.  Called # listed and was advised it was d/c.  No other # in chart, will sign off message per triage protocol.

## 2012-09-19 ENCOUNTER — Telehealth: Payer: Self-pay | Admitting: Pulmonary Disease

## 2012-09-19 NOTE — Telephone Encounter (Signed)
Pt states she needs verification that she still needs O2 sent to [facility] for medicare requirement.Alexa Stephens

## 2012-09-19 NOTE — Telephone Encounter (Signed)
atc Fast busy signal Will try back

## 2012-09-19 NOTE — Telephone Encounter (Signed)
Error.Alexa Stephens ° °

## 2012-09-20 NOTE — Telephone Encounter (Signed)
ATC home number and it was busy signal. Called cell number and message stated call cannot be completed at this time. WCB. Carron Curie, CMA

## 2012-09-23 ENCOUNTER — Ambulatory Visit (INDEPENDENT_AMBULATORY_CARE_PROVIDER_SITE_OTHER): Payer: Medicare Other | Admitting: Pulmonary Disease

## 2012-09-23 ENCOUNTER — Encounter: Payer: Self-pay | Admitting: Pulmonary Disease

## 2012-09-23 VITALS — BP 104/62 | HR 85 | Temp 98.9°F | Ht 62.0 in | Wt 137.0 lb

## 2012-09-23 DIAGNOSIS — F17200 Nicotine dependence, unspecified, uncomplicated: Secondary | ICD-10-CM

## 2012-09-23 DIAGNOSIS — J9611 Chronic respiratory failure with hypoxia: Secondary | ICD-10-CM

## 2012-09-23 DIAGNOSIS — R0683 Snoring: Secondary | ICD-10-CM

## 2012-09-23 DIAGNOSIS — R0989 Other specified symptoms and signs involving the circulatory and respiratory systems: Secondary | ICD-10-CM

## 2012-09-23 DIAGNOSIS — Z720 Tobacco use: Secondary | ICD-10-CM

## 2012-09-23 DIAGNOSIS — R0902 Hypoxemia: Secondary | ICD-10-CM

## 2012-09-23 DIAGNOSIS — J9610 Chronic respiratory failure, unspecified whether with hypoxia or hypercapnia: Secondary | ICD-10-CM

## 2012-09-23 DIAGNOSIS — J4489 Other specified chronic obstructive pulmonary disease: Secondary | ICD-10-CM

## 2012-09-23 DIAGNOSIS — R0609 Other forms of dyspnea: Secondary | ICD-10-CM

## 2012-09-23 DIAGNOSIS — J449 Chronic obstructive pulmonary disease, unspecified: Secondary | ICD-10-CM

## 2012-09-23 NOTE — Assessment & Plan Note (Signed)
Stable on her inhaler/nebulizer regimen.  Finances and transportation have been issue for medical therapy for her.  She will call back if she can get set up for pulmonary rehab.

## 2012-09-23 NOTE — Progress Notes (Signed)
Chief Complaint  Patient presents with  . COPD    Breathing is doing well. Reports SOB from time to time, coughing. Denies chest tightness or wheezing.    CC: Alexa Stephens  History of Present Illness: Alexa Stephens is a 62 y.o. female smoker with severe COPD, hypoxia, and snoring.  She is not having much cough, wheeze, or sputum.  She gets winded and tired very easily with activities.  She uses 2 liters oxygen 24/7.  She denies leg swelling, but does occasionally get cramps in her feet.  She still smokes about 1 to 2 cigarettes per day >> mostly when she feels stressed.  She has not been able to schedule her sleep study yet.  She feels she has too many other things going on to schedule study yet.  Tests: Spirometry 11/01/11>>FEV1 0.48 (21%), FEV1% 37 (difficult pt effort). 11/01/11 SpO2 85% on room air at rest A1AT 06/21/12 >> 140, PI-MS  She  has a past medical history of Anxiety disorder; Asthma; COPD (chronic obstructive pulmonary disease); Headache(784.0); Hyperlipemia; Hypertension; Pneumonia (Sept 2013); and Sepsis (Sept 2013).  She  has past surgical history that includes Cosmetic surgery; Cystectomy; and Hand surgery.   Current Outpatient Prescriptions on File Prior to Visit  Medication Sig Dispense Refill  . albuterol (PROVENTIL HFA;VENTOLIN HFA) 108 (90 BASE) MCG/ACT inhaler Inhale 2 puffs into the lungs every 4 (four) hours as needed. For shortness of breath.  1 Inhaler  6  . albuterol (PROVENTIL) (5 MG/ML) 0.5% nebulizer solution Take 0.5 mLs (2.5 mg total) by nebulization every 4 (four) hours.  40 mL  1  . aspirin 81 MG tablet Take 81 mg by mouth daily as needed.      . Calcium Carbonate-Vitamin D (CALCIUM-VITAMIN D) 500-200 MG-UNIT per tablet Take 1 tablet by mouth daily.      . Cholecalciferol (VITAMIN D) 2000 UNITS CAPS Take by mouth daily.      . clonazePAM (KLONOPIN) 1 MG tablet Take 1 tablet (1 mg total) by mouth 2 (two) times daily as needed.  60 tablet  4  . doxepin  (SINEQUAN) 25 MG capsule Take 25 mg by mouth daily as needed.       Marland Kitchen ipratropium (ATROVENT) 0.02 % nebulizer solution Take 2.5 mLs (500 mcg total) by nebulization 4 (four) times daily.  300 mL  6  . Ipratropium-Albuterol (COMBIVENT RESPIMAT) 20-100 MCG/ACT AERS respimat Inhale 1 puff into the lungs every 6 (six) hours.  1 Inhaler  4  . Multiple Vitamin (MULTIVITAMIN) tablet Take 1 tablet by mouth daily.      . Nutritional Supplements (ENSURE COMPLETE SHAKE) LIQD Take 1 Bottle by mouth daily.      . simvastatin (ZOCOR) 20 MG tablet Take 1 tablet (20 mg total) by mouth every evening.  30 tablet  5  . traMADol (ULTRAM) 50 MG tablet Take 1 tablet (50 mg total) by mouth every 8 (eight) hours as needed for pain.  60 tablet  0  . valsartan-hydrochlorothiazide (DIOVAN HCT) 160-25 MG per tablet Take 1 tablet by mouth daily.  30 tablet  4  . venlafaxine XR (EFFEXOR XR) 150 MG 24 hr capsule Take 1 capsule (150 mg total) by mouth daily.  30 capsule  4  . venlafaxine XR (EFFEXOR-XR) 75 MG 24 hr capsule Take 1 capsule (75 mg total) by mouth daily. Taken with 150mg  cap to equal 225mg  daily.  30 capsule  4  . vitamin C (ASCORBIC ACID) 500 MG tablet Take 500 mg  by mouth daily.       No current facility-administered medications on file prior to visit.    Allergies  Allergen Reactions  . Lexapro (Escitalopram)     Gi upset  . Naproxen     High BP  . Skelaxin (Metaxalone)    She has a  family history includes Cancer in her maternal grandmother and Lung cancer in her maternal grandfather.  She  reports that she has been smoking Cigarettes.  She has been smoking about 0.00 packs per day for the past 40 years. She does not have any smokeless tobacco history on file. She reports that she does not drink alcohol or use illicit drugs.   Physical Exam:  General - No distress, wearing oxygen ENT - No sinus tenderness, no oral exudate, no LAN Cardiac - s1s2 regular, no murmur Chest - prolonged exhalation,  decreased breath sounds, no wheeze/rales Back - no focal tenderness Abd - soft, non-tender Ext - no edema, deformity right hand Neuro - normal strength Skin - no rashes Psych - normal mood, and behavior   Assessment/Plan:  [name], MD [place] [facility]/Sleep Pager:  [phone] 09/23/2012, 10:30 AM

## 2012-09-23 NOTE — Assessment & Plan Note (Signed)
She will call back when she is able to schedule sleep study.

## 2012-09-23 NOTE — Telephone Encounter (Signed)
ATC home number and states call cannot go through at this time. ATC cell number and message stated customer cannot be reached at this time. Per protocol I will sign off on message.  Carron Curie, CMA

## 2012-09-23 NOTE — Patient Instructions (Signed)
Follow up in 6 months 

## 2012-09-23 NOTE — Assessment & Plan Note (Signed)
She continues to smoke.  Discussed options to helping with smoking cessation.     

## 2012-09-23 NOTE — Assessment & Plan Note (Addendum)
Her SpO2 on room air at rest today was 87%.  She is to continue 2 liters oxygen 24/7.

## 2012-10-22 ENCOUNTER — Telehealth: Payer: Self-pay | Admitting: Pulmonary Disease

## 2012-10-22 MED ORDER — ALBUTEROL SULFATE (5 MG/ML) 0.5% IN NEBU: 2.5000 mg | INHALATION_SOLUTION | RESPIRATORY_TRACT | Status: DC

## 2012-10-22 NOTE — Telephone Encounter (Signed)
Refill has been sent in. Tried to call pt but her # was busy.

## 2012-10-25 ENCOUNTER — Telehealth: Payer: Self-pay | Admitting: Pulmonary Disease

## 2012-10-25 NOTE — Telephone Encounter (Signed)
I called home # and it is not a working Veterinary surgeon alternate and was advised the person you are trying to call can't accept calls at this time

## 2012-10-29 NOTE — Telephone Encounter (Signed)
Line busy. And mobile number not accepting calls.  I called the pharmacy and the pt picked up the rx. I will close the message and await a call back if the pt still needs anything. Carron Curie, CMA

## 2012-11-10 ENCOUNTER — Other Ambulatory Visit: Payer: Self-pay | Admitting: Family

## 2012-11-11 ENCOUNTER — Telehealth: Payer: Self-pay | Admitting: Family

## 2012-11-11 NOTE — Telephone Encounter (Signed)
PT states that she does not currently have transportation and he is on oxygen. She states that she cannot schedule a follow up appointment right now, and wants to know why she needs to come back in for a refill. Please assist.

## 2012-11-11 NOTE — Telephone Encounter (Signed)
Pt has switch pharm [facility] [address] [phone]. Pt needs clonazepam 1 mg twice a day#60 with refills. Pt has appt sch for 11-18-12

## 2012-11-13 MED ORDER — CLONAZEPAM 1 MG PO TABS: 1.0000 mg | ORAL_TABLET | Freq: Two times a day (BID) | ORAL | Status: DC | PRN

## 2012-11-13 NOTE — Telephone Encounter (Signed)
Confirmed with [facility] that pt had not picked up Rx sent on 11/10/12. Rx canceled and phoned into [facility] and pt aware

## 2012-11-18 ENCOUNTER — Ambulatory Visit (INDEPENDENT_AMBULATORY_CARE_PROVIDER_SITE_OTHER): Payer: Medicare Other | Admitting: Family

## 2012-11-18 ENCOUNTER — Encounter: Payer: Self-pay | Admitting: Family

## 2012-11-18 VITALS — BP 98/70 | HR 99 | Wt 131.0 lb

## 2012-11-18 DIAGNOSIS — I10 Essential (primary) hypertension: Secondary | ICD-10-CM

## 2012-11-18 DIAGNOSIS — G8929 Other chronic pain: Secondary | ICD-10-CM

## 2012-11-18 DIAGNOSIS — L84 Corns and callosities: Secondary | ICD-10-CM

## 2012-11-18 DIAGNOSIS — M549 Dorsalgia, unspecified: Secondary | ICD-10-CM

## 2012-11-18 DIAGNOSIS — E7800 Pure hypercholesterolemia, unspecified: Secondary | ICD-10-CM

## 2012-11-18 DIAGNOSIS — J449 Chronic obstructive pulmonary disease, unspecified: Secondary | ICD-10-CM

## 2012-11-18 DIAGNOSIS — G43909 Migraine, unspecified, not intractable, without status migrainosus: Secondary | ICD-10-CM

## 2012-11-18 DIAGNOSIS — Z23 Encounter for immunization: Secondary | ICD-10-CM

## 2012-11-18 MED ORDER — IPRATROPIUM-ALBUTEROL 20-100 MCG/ACT IN AERS: 1.0000 | INHALATION_SPRAY | Freq: Four times a day (QID) | RESPIRATORY_TRACT | Status: DC

## 2012-11-18 MED ORDER — KETOROLAC TROMETHAMINE 60 MG/2ML IM SOLN
60.0000 mg | Freq: Once | INTRAMUSCULAR | Status: AC
  Administered 2012-11-18: 60 mg via INTRAMUSCULAR

## 2012-11-18 MED ORDER — ALBUTEROL SULFATE HFA 108 (90 BASE) MCG/ACT IN AERS: 2.0000 | INHALATION_SPRAY | RESPIRATORY_TRACT | Status: DC | PRN

## 2012-11-18 MED ORDER — CLONAZEPAM 1 MG PO TABS: 1.0000 mg | ORAL_TABLET | Freq: Two times a day (BID) | ORAL | Status: DC | PRN

## 2012-11-18 NOTE — Patient Instructions (Addendum)

## 2012-11-19 ENCOUNTER — Encounter: Payer: Self-pay | Admitting: Family

## 2012-11-19 LAB — HEPATIC FUNCTION PANEL
AST: 22 U/L (ref 0–37)
Albumin: 4 g/dL (ref 3.5–5.2)
Alkaline Phosphatase: 76 U/L (ref 39–117)
Total Protein: 7.1 g/dL (ref 6.0–8.3)

## 2012-11-19 LAB — LIPID PANEL
Cholesterol: 198 mg/dL (ref 0–200)
HDL: 53.1 mg/dL (ref >39.00)
LDL Cholesterol: 128 mg/dL — ABNORMAL HIGH (ref 0–99)
Triglycerides: 83 mg/dL (ref 0.0–149.0)

## 2012-11-19 LAB — BASIC METABOLIC PANEL
Chloride: 94 mEq/L — ABNORMAL LOW (ref 96–112)
Creatinine, Ser: 0.5 mg/dL (ref 0.4–1.2)
Potassium: 3.5 mEq/L (ref 3.5–5.1)
Sodium: 138 mEq/L (ref 135–145)

## 2012-11-19 LAB — CBC WITH DIFFERENTIAL/PLATELET
Basophils Absolute: 0 10*3/uL (ref 0.0–0.1)
Eosinophils Relative: 1.5 % (ref 0.0–5.0)
Hemoglobin: 11.4 g/dL — ABNORMAL LOW (ref 12.0–15.0)
Lymphocytes Relative: 42.3 % (ref 12.0–46.0)
Monocytes Relative: 7.7 % (ref 3.0–12.0)
Platelets: 242 10*3/uL (ref 150.0–400.0)
RDW: 14.3 % (ref 11.5–14.6)
WBC: 6.3 10*3/uL (ref 4.5–10.5)

## 2012-11-19 NOTE — Progress Notes (Signed)
Subjective:    Patient ID: Alexa Stephens, female    DOB: 1950-12-16, 62 y.o.   MRN: [id number]  HPI 62 year old white female, smoker, 62 is in for recheck of hypertension, hyperlipidemia, COPD, tobacco abuse, chronic respiratory failure, depression, migraine headaches, chronic low back pain. Is currently tolerating her medications well. Reports having a migraine headache over the last day does not responding well to her current medication regimen. Is also requesting a refill on pain medication. Our last office visit we discussed that we don't manage any chronic pain. Therefore, she is requesting to go to the pain clinic. She is also requesting a referral to podiatry for callus on her left great toe.   Review of Systems  Constitutional: Negative.   Respiratory: Negative.   Cardiovascular: Negative.   Gastrointestinal: Negative.   Endocrine: Negative.   Genitourinary: Negative.   Musculoskeletal: Positive for back pain.       Chronic low back pain  Skin: Positive for wound.       Callus left great toe  Allergic/Immunologic: Negative.   Neurological: Positive for headaches. Negative for dizziness and weakness.  Psychiatric/Behavioral: Negative.    Past Medical History  Diagnosis Date  . Anxiety disorder   . Asthma   . COPD (chronic obstructive pulmonary disease)     Not on home O2 yet but is prescribed.  . Headache(784.0)   . Hyperlipemia   . Hypertension   . Pneumonia Sept 2013  . Sepsis Sept 2013    History   Social History  . Marital Status: Single    Spouse Name: N/A    Number of Children: N/A  . Years of Education: N/A   Occupational History  . retired    Social History Main Topics  . Smoking status: Current Every Day Smoker -- 40 years    Types: Cigarettes  . Smokeless tobacco: Not on file     Comment: uses e-cigs. 1-2 cig a week as well-06/20/12  . Alcohol Use: No  . Drug Use: No  . Sexual Activity: Not on file   Other Topics Concern  . Not on file   Social  History Narrative  . No narrative on file    Past Surgical History  Procedure Laterality Date  . Cosmetic surgery    . Cystectomy    . Hand surgery      Family History  Problem Relation Age of Onset  . Lung cancer Maternal Grandfather   . Cancer Maternal Grandmother     Allergies  Allergen Reactions  . Lexapro [Escitalopram]     Gi upset  . Naproxen     High BP  . Skelaxin [Metaxalone]     Current Outpatient Prescriptions on File Prior to Visit  Medication Sig Dispense Refill  . albuterol (PROVENTIL) (5 MG/ML) 0.5% nebulizer solution Take 0.5 mLs (2.5 mg total) by nebulization every 4 (four) hours.  40 mL  5  . aspirin 81 MG tablet Take 81 mg by mouth daily as needed.      . Calcium Carbonate-Vitamin D (CALCIUM-VITAMIN D) 500-200 MG-UNIT per tablet Take 1 tablet by mouth daily.      . Cholecalciferol (VITAMIN D) 2000 UNITS CAPS Take by mouth daily.      Marland Kitchen doxepin (SINEQUAN) 25 MG capsule Take 25 mg by mouth daily as needed.       Marland Kitchen ipratropium (ATROVENT) 0.02 % nebulizer solution Take 2.5 mLs (500 mcg total) by nebulization 4 (four) times daily.  300 mL  6  .  Multiple Vitamin (MULTIVITAMIN) tablet Take 1 tablet by mouth daily.      . Nutritional Supplements (ENSURE COMPLETE SHAKE) LIQD Take 1 Bottle by mouth daily.      . simvastatin (ZOCOR) 20 MG tablet Take 1 tablet (20 mg total) by mouth every evening.  30 tablet  5  . traMADol (ULTRAM) 50 MG tablet Take 1 tablet (50 mg total) by mouth every 8 (eight) hours as needed for pain.  60 tablet  0  . valsartan-hydrochlorothiazide (DIOVAN HCT) 160-25 MG per tablet Take 1 tablet by mouth daily.  30 tablet  4  . venlafaxine XR (EFFEXOR XR) 150 MG 24 hr capsule Take 1 capsule (150 mg total) by mouth daily.  30 capsule  4  . venlafaxine XR (EFFEXOR-XR) 75 MG 24 hr capsule Take 1 capsule (75 mg total) by mouth daily. Taken with 150mg  cap to equal 225mg  daily.  30 capsule  4  . vitamin C (ASCORBIC ACID) 500 MG tablet Take 500 mg by  mouth daily.       No current facility-administered medications on file prior to visit.    BP 98/70  Pulse 99  Wt 131 lb (59.421 kg)  BMI 23.95 kg/m2chart    Objective:   Physical Exam  Constitutional: She is oriented to person, place, and time. She appears well-developed and well-nourished.  HENT:  Right Ear: External ear normal.  Left Ear: External ear normal.  Nose: Nose normal.  Mouth/Throat: Oropharynx is clear and moist.  Neck: Normal range of motion. Neck supple.  Cardiovascular: Normal rate, regular rhythm and normal heart sounds.   Pulmonary/Chest: Effort normal and breath sounds normal.  Abdominal: Soft. Bowel sounds are normal.  Musculoskeletal: Normal range of motion.  Neurological: She is alert and oriented to person, place, and time. She has normal reflexes.  Skin: Skin is warm and dry.  Small callus to the distal aspect of the left great toe.  Psychiatric: She has a normal mood and affect.          Assessment & Plan:  Assessment: 1. Hypertension 2. Hyperlipidemia 3. COPD 4. Tobacco abuse 5. COPD 6. Migraine headache 7. Chronic low back pain-refer to pain clinic 8. Callus left great toe-refer her to podiatry.  Plan: Strongly encourage smoking cessation. Refilled medications. Toradol 60 mg IM x1 given. Influenza vaccine administered. Refer to pain clinic and podiatry. Followup with patient in 4-6 months and sooner as needed. Labs sent.

## 2012-12-04 ENCOUNTER — Ambulatory Visit: Payer: Self-pay | Admitting: Podiatry

## 2012-12-09 ENCOUNTER — Other Ambulatory Visit: Payer: Self-pay | Admitting: Family

## 2012-12-24 ENCOUNTER — Telehealth: Payer: Self-pay | Admitting: Family

## 2012-12-24 ENCOUNTER — Ambulatory Visit (INDEPENDENT_AMBULATORY_CARE_PROVIDER_SITE_OTHER)
Admission: RE | Admit: 2012-12-24 | Discharge: 2012-12-24 | Disposition: A | Discharge status: Home / Self Care | Payer: Medicare Other | Source: Ambulatory Visit | Attending: Family | Admitting: Family

## 2012-12-24 ENCOUNTER — Encounter: Payer: Self-pay | Admitting: Family

## 2012-12-24 ENCOUNTER — Ambulatory Visit (INDEPENDENT_AMBULATORY_CARE_PROVIDER_SITE_OTHER): Payer: Medicare Other | Admitting: Family

## 2012-12-24 VITALS — BP 100/80 | HR 97 | Temp 98.0°F | Wt 126.0 lb

## 2012-12-24 DIAGNOSIS — J441 Chronic obstructive pulmonary disease with (acute) exacerbation: Secondary | ICD-10-CM

## 2012-12-24 DIAGNOSIS — F17200 Nicotine dependence, unspecified, uncomplicated: Secondary | ICD-10-CM

## 2012-12-24 DIAGNOSIS — Z720 Tobacco use: Secondary | ICD-10-CM

## 2012-12-24 DIAGNOSIS — R0602 Shortness of breath: Secondary | ICD-10-CM

## 2012-12-24 LAB — CBC WITH DIFFERENTIAL/PLATELET
Basophils Absolute: 0 10*3/uL (ref 0.0–0.1)
Basophils Relative: 0.4 % (ref 0.0–3.0)
Eosinophils Absolute: 0 10*3/uL (ref 0.0–0.7)
HCT: 35.5 % — ABNORMAL LOW (ref 36.0–46.0)
Hemoglobin: 11.8 g/dL — ABNORMAL LOW (ref 12.0–15.0)
Lymphocytes Relative: 23.2 % (ref 12.0–46.0)
Lymphs Abs: 1.4 10*3/uL (ref 0.7–4.0)
MCHC: 33.2 g/dL (ref 30.0–36.0)
MCV: 96.9 fl (ref 78.0–100.0)
Monocytes Absolute: 0.4 10*3/uL (ref 0.1–1.0)
Neutro Abs: 4.2 10*3/uL (ref 1.4–7.7)
RBC: 3.67 Mil/uL — ABNORMAL LOW (ref 3.87–5.11)
RDW: 14.6 % (ref 11.5–14.6)
WBC: 6.2 10*3/uL (ref 4.5–10.5)

## 2012-12-24 LAB — BASIC METABOLIC PANEL
BUN: 7 mg/dL (ref 6–23)
Calcium: 9.5 mg/dL (ref 8.4–10.5)
Chloride: 92 mEq/L — ABNORMAL LOW (ref 96–112)
Creatinine, Ser: 0.5 mg/dL (ref 0.4–1.2)
Glucose, Bld: 110 mg/dL — ABNORMAL HIGH (ref 70–99)

## 2012-12-24 LAB — HEPATIC FUNCTION PANEL
ALT: 21 U/L (ref 0–35)
Bilirubin, Direct: 0.1 mg/dL (ref 0.0–0.3)
Total Bilirubin: 0.5 mg/dL (ref 0.3–1.2)

## 2012-12-24 MED ORDER — PREDNISONE 20 MG PO TABS: ORAL_TABLET | ORAL | Status: AC

## 2012-12-24 NOTE — Telephone Encounter (Signed)
Pt being seen in office 

## 2012-12-24 NOTE — Progress Notes (Signed)
Subjective:    Patient ID: Alexa Stephens, female    DOB: Aug 28, 1950, 62 y.o.   MRN: [id number]  HPI 62 year old WF, smoker, is in today with c/o SOB, congestion, chest tightness x 5-10 days without worsening. Cough productive with yellow phlegm. Reports not feeling well x 3 months. She called in this morning and was directed to go to the ED. Patient declined and came in with an appt. She has a history of COPD. No chest pain. She is on 3 liters of oxygen continuously. Reports feeling like she did when she had penumonia   Review of Systems  Constitutional: Negative.   HENT: Positive for congestion.        Yellow phelgm   Respiratory: Positive for chest tightness and shortness of breath.   Cardiovascular: Positive for chest pain.       Right chest at times   Gastrointestinal: Negative.  Negative for nausea and vomiting.  Endocrine: Negative.   Musculoskeletal: Negative.   Skin: Negative.   Allergic/Immunologic: Negative.   Neurological: Negative.   Hematological: Negative.   Psychiatric/Behavioral: Negative.    Past Medical History  Diagnosis Date  . Anxiety disorder   . Asthma   . COPD (chronic obstructive pulmonary disease)     Not on home O2 yet but is prescribed.  . Headache(784.0)   . Hyperlipemia   . Hypertension   . Pneumonia Sept 2013  . Sepsis Sept 2013    History   Social History  . Marital Status: Single    Spouse Name: N/A    Number of Children: N/A  . Years of Education: N/A   Occupational History  . retired    Social History Main Topics  . Smoking status: Current Every Day Smoker -- 40 years    Types: Cigarettes  . Smokeless tobacco: Not on file     Comment: uses e-cigs. 1-2 cig a week as well-06/20/12  . Alcohol Use: No  . Drug Use: No  . Sexual Activity: Not on file   Other Topics Concern  . Not on file   Social History Narrative  . No narrative on file    Past Surgical History  Procedure Laterality Date  . Cosmetic surgery    . Cystectomy     . Hand surgery      Family History  Problem Relation Age of Onset  . Lung cancer Maternal Grandfather   . Cancer Maternal Grandmother     Allergies  Allergen Reactions  . Lexapro [Escitalopram]     Gi upset  . Naproxen     High BP  . Skelaxin [Metaxalone]     Current Outpatient Prescriptions on File Prior to Visit  Medication Sig Dispense Refill  . albuterol (PROVENTIL HFA;VENTOLIN HFA) 108 (90 BASE) MCG/ACT inhaler Inhale 2 puffs into the lungs every 4 (four) hours as needed. For shortness of breath.  1 Inhaler  4  . albuterol (PROVENTIL) (5 MG/ML) 0.5% nebulizer solution Take 0.5 mLs (2.5 mg total) by nebulization every 4 (four) hours.  40 mL  5  . aspirin 81 MG tablet Take 81 mg by mouth daily as needed.      . Calcium Carbonate-Vitamin D (CALCIUM-VITAMIN D) 500-200 MG-UNIT per tablet Take 1 tablet by mouth daily.      . Cholecalciferol (VITAMIN D) 2000 UNITS CAPS Take by mouth daily.      . clonazePAM (KLONOPIN) 1 MG tablet Take 1 tablet (1 mg total) by mouth 2 (two) times daily as needed  for anxiety.  60 tablet  3  . doxepin (SINEQUAN) 25 MG capsule Take 25 mg by mouth daily as needed.       Marland Kitchen ipratropium (ATROVENT) 0.02 % nebulizer solution Take 2.5 mLs (500 mcg total) by nebulization 4 (four) times daily.  300 mL  6  . Ipratropium-Albuterol (COMBIVENT RESPIMAT) 20-100 MCG/ACT AERS respimat Inhale 1 puff into the lungs every 6 (six) hours.  1 Inhaler  4  . Multiple Vitamin (MULTIVITAMIN) tablet Take 1 tablet by mouth daily.      . Nutritional Supplements (ENSURE COMPLETE SHAKE) LIQD Take 1 Bottle by mouth daily.      . simvastatin (ZOCOR) 20 MG tablet Take 1 tablet (20 mg total) by mouth every evening.  30 tablet  5  . traMADol (ULTRAM) 50 MG tablet Take 1 tablet (50 mg total) by mouth every 8 (eight) hours as needed for pain.  60 tablet  0  . valsartan-hydrochlorothiazide (DIOVAN-HCT) 160-25 MG per tablet TAKE 1 TABLET EVERY DAY  30 tablet  3  . vitamin C (ASCORBIC ACID)  500 MG tablet Take 500 mg by mouth daily.       No current facility-administered medications on file prior to visit.    BP 100/80  Pulse 97  Temp(Src) 98 F (36.7 C) (Oral)  Wt 126 lb (57.153 kg)  BMI 23.04 kg/m2chart    Objective:   Physical Exam  Constitutional: She is oriented to person, place, and time. She appears well-developed and well-nourished.  HENT:  Right Ear: External ear normal.  Left Ear: External ear normal.  Mouth/Throat: Oropharynx is clear and moist.  Neck: Normal range of motion. Neck supple.  Cardiovascular: Normal rate, regular rhythm and normal heart sounds.   Pulmonary/Chest: Effort normal. She has wheezes. She has no rales.  Decreased air movement   Abdominal: Soft. Bowel sounds are normal.  Neurological: She is alert and oriented to person, place, and time.  Skin: Skin is warm and dry.  Psychiatric: She has a normal mood and affect.          Assessment & Plan:  Assessment: 1. COPD exacerbation 2. SOB 3. Tobacco Abuse  Plan:  Labs present. Chest x-ray ordered. Patient will go immediately after this visit to have that done. If symptoms worsen prior to meeting back with her she'll go to the emergency department. prednisone 60x3, 40x3, 20x3 sent to the pharmacy. Will followup pending results

## 2012-12-24 NOTE — Patient Instructions (Addendum)
[address] [place]   Chronic Obstructive Pulmonary Disease Chronic obstructive pulmonary disease (COPD) is a condition in which airflow from the lungs is restricted. The lungs can never return to normal, but there are measures you can take which will improve them and make you feel better. CAUSES   Smoking.  Exposure to secondhand smoke.  Breathing in irritants such as air pollution, dust, cigarette smoke, strong odors, aerosol sprays, or paint fumes.  History of lung infections. SYMPTOMS   Deep, persistent (chronic) cough with a large amount of thick mucus.  Wheezing.  Shortness of breath, especially with physical activity.  Feeling like you cannot get enough air.  Difficulty breathing.  Rapid breaths (tachypnea).  Gray or bluish discoloration (cyanosis) of the skin, especially in fingers, toes, or lips.  Fatigue.  Weight loss.  Swelling in legs, ankles, or feet.  Fast heartbeat (tachycardia).  Frequent lung infections.   Chest tightness. DIAGNOSIS  Initial diagnosis may be based on your history, symptoms, and physical examination. Additional tests for COPD may include:  Chest X-ray.  Computed tomography (CT) scan.  Lung (pulmonary) function tests.  Blood tests. TREATMENT  Treatment focuses on making you comfortable (supportive care). Your caregiver may prescribe medicines (inhaled or pills) to help improve your breathing. Additional treatment options may include oxygen therapy and pulmonary rehabilitation. Treatment should also include reducing your exposure to known irritants and following a plan to stop smoking. HOME CARE INSTRUCTIONS   Take all medicines, including antibiotic medicines, as directed by your caregiver.  Use inhaled medicines as directed by your caregiver.  Avoid medicines or cough syrups that dry up your airway (antihistamines) and slow down the elimination of secretions. This decreases respiratory capacity and may lead to  infections.  If you smoke, stop smoking.  Avoid exposure to smoke, chemicals, and fumes that aggravate your breathing.  Avoid contact with individuals that have a contagious illness.  Avoid extreme temperature and humidity changes.  Use humidifiers at home and at your bedside if they do not make breathing difficult.  Drink enough water and fluids to keep your urine clear or pale yellow. This loosens secretions.  Eat healthy foods. Eating smaller, more frequent meals and resting before meals may help you maintain your strength.  Ask your caregiver about the use of vitamins and mineral supplements.  Stay active. Exercise and physical activity will help maintain your ability to do things you want to do.  Balance activity with periods of rest.  Assume a position of comfort if you become short of breath.  Learn and use relaxation techniques.  Learn and use controlled breathing techniques as directed by your caregiver. Controlled breathing techniques include:  Pursed lip breathing. This breathing technique starts with breathing in (inhaling) through your nose for 1 second. Next, purse your lips as if you were going to whistle. Then breathe out (exhale) through the pursed lips for 2 seconds.  Diaphragmatic breathing. Start by putting one hand on your abdomen just above your waist. Inhale slowly through your nose. The hand on your abdomen should move out. Then exhale slowly through pursed lips. You should be able to feel the hand on your abdomen moving in as you exhale.  Learn and use controlled coughing to clear mucus from your lungs. Controlled coughing is a series of short, progressive coughs. The steps of controlled coughing are: 1. Lean your head slightly forward. 2. Breathe in deeply using diaphragmatic breathing. 3. Try to hold your breath for 3 seconds. 4. Keep  your mouth slightly open while coughing twice. 5. Spit any mucus out into a tissue. 6. Rest and repeat the steps once  or twice as needed.  Receive all protective vaccines your caregiver suggests, especially pneumococcal and influenza vaccines.  Learn to manage stress.  Schedule and attend all follow-up appointments as directed by your caregiver. It is important to keep all your appointments.  Participate in pulmonary rehabilitation as directed by your caregiver.  Use home oxygen as suggested. SEEK MEDICAL CARE IF:   You are coughing up more mucus than usual.  There is a change in the color or thickness of the mucus.  Breathing is more labored than usual.  Your breathing is faster than usual.  Your skin color is more cyanotic than usual.  You are running out of the medicine you take for your breathing.  You are anxious, apprehensive, or restless.  You have a fever. SEEK IMMEDIATE MEDICAL CARE IF:   You have a rapid heart rate.  You have shortness of breath while you are resting.  You have shortness of breath that prevents you from being able to talk.  You have shortness of breath that prevents you from performing your usual physical activities.  You have chest pain lasting longer than 5 minutes.  You have a seizure.  Your family or friends notice that you are agitated or confused. MAKE SURE YOU:   Understand these instructions.  Will watch your condition.  Will get help right away if you are not doing well or get worse. Document Released: 11/23/2004 Document Revised: 11/08/2011 Document Reviewed: 04/15/2010 [facility] Patient Information 2014 [place], [place].

## 2012-12-24 NOTE — Telephone Encounter (Signed)
[facility] Triage Call Report Triage Record Num: [id number] Operator: Aundra Millet Patient Name: Alexa Stephens Call Date & Time: 12/23/2012 5:00:35PM Patient Phone: [phone] PCP: Adline Mango Patient Gender: Female PCP Fax : Patient DOB: Nov 20, 1950 [facility] Name: [facility] Reason for Call: Caller: Hanne/Patient; PCP: Adline Mango (Family Practice); CB#: [phone]; Call regarding Blood pressure issue, Today, 12/23/2012 , pt calling c/o of pulse pounding since 1600 with chest tightness. "Could feel pulse in temple". She comments she has COPD and thinks she has been talking too much today. O2 Sat 98 % on 2 L , Pulse 60. Feel weak. RN reached Activate EMS 911 for chest tightness in the last hour for > 5 minutes per Chest Pain protocol. RN advised several times to hang up and 911 , but Pt declined stating she has called her Son and is going to see how she feels later. Protocol(s) Used: Chest Pain Recommended Outcome per Protocol: Activate EMS 911 Reason for Outcome: Pressure, fullness, squeezing sensation or pain anywhere in the chest lasting 5 or more minutes now or within the last hour. Pain is NOT associated with taking a deep breath or a productive cough, movement, or touch to a localized area on the chest. Care Advice: ~ IMMEDIATE ACTION After calling EMS 911, have the person chew one aspirin tablet (325 mg), or 4 baby aspirin (81mg ) with a small amount of water now if conscious, not allergic to aspirin, or if has not been told to avoid taking aspirin by their provider. It is important to use aspirin, not acetaminophen. ~ An adult should stay with the patient, preferably one trained in CPR. If the person is not trained in CPR, then he or she should provide hands-only (compression-only) CPR as recommended by the American [employer] Association.

## 2012-12-26 ENCOUNTER — Encounter: Payer: Self-pay | Admitting: Family

## 2012-12-26 ENCOUNTER — Ambulatory Visit: Payer: Medicare Other | Admitting: Family

## 2012-12-26 ENCOUNTER — Ambulatory Visit (INDEPENDENT_AMBULATORY_CARE_PROVIDER_SITE_OTHER): Payer: Medicare Other | Admitting: Family

## 2012-12-26 VITALS — BP 108/68 | HR 91 | Wt 126.0 lb

## 2012-12-26 DIAGNOSIS — R05 Cough: Secondary | ICD-10-CM

## 2012-12-26 DIAGNOSIS — J209 Acute bronchitis, unspecified: Secondary | ICD-10-CM

## 2012-12-26 DIAGNOSIS — I10 Essential (primary) hypertension: Secondary | ICD-10-CM

## 2012-12-26 MED ORDER — DOXYCYCLINE HYCLATE 100 MG PO TABS: 100.0000 mg | ORAL_TABLET | Freq: Two times a day (BID) | ORAL | Status: DC

## 2012-12-26 MED ORDER — VALSARTAN-HYDROCHLOROTHIAZIDE 160-12.5 MG PO TABS: 1.0000 | ORAL_TABLET | Freq: Every day | ORAL | Status: DC

## 2012-12-26 NOTE — Progress Notes (Signed)
  Subjective:    Patient ID: Alexa Stephens, female    DOB: 11-09-1950, 62 y.o.   MRN: [id number]  HPI 62 year old WF, smoker, is in today for a recheck of a COPD exacerbation that has been going on 1 week. She is now on prednisone. Breathing better today but still has some SOB and fatigue.  She is on O2, 2Liters nasal canula.   Patient has c/o of blood pressure being too low subsequently having lightheadedness. She is taking Diovan HCT and would like the dosage reduced   Review of Systems  Constitutional: Negative.   HENT: Negative.   Respiratory: Positive for cough and shortness of breath.   Cardiovascular: Negative.        Concerned about low blood pressure.  Gastrointestinal: Negative.   Endocrine: Negative.   Genitourinary: Negative.   Musculoskeletal: Negative.   Skin: Negative.   Allergic/Immunologic: Negative.   Neurological: Negative.   Hematological: Negative.   Psychiatric/Behavioral: Negative.        Objective:   Physical Exam  Constitutional: She is oriented to person, place, and time. She appears well-developed and well-nourished.  Neck: Normal range of motion. Neck supple.  Cardiovascular: Normal rate, regular rhythm and normal heart sounds.   Pulmonary/Chest: Effort normal. She has no wheezes.  Abdominal: Soft. Bowel sounds are normal.  Musculoskeletal: Normal range of motion.  Neurological: She is alert and oriented to person, place, and time.  Skin: Skin is warm and dry.  Psychiatric: She has a normal mood and affect.          Assessment & Plan:  Assessment:  1. COPD-exacerbation 2. Cough 3. Hypertension  Plan: Continue Prednisone. Doxycycline 100mg  twice a day x 10 days. Stop smoking. Decrease Diovan to 160/12.5 once a day. Call the office with any questions or concerns. Recheck in 2 weeks and sooner as needed.

## 2012-12-26 NOTE — Patient Instructions (Signed)

## 2013-01-06 ENCOUNTER — Other Ambulatory Visit: Payer: Self-pay | Admitting: Pulmonary Disease

## 2013-01-06 ENCOUNTER — Other Ambulatory Visit: Payer: Self-pay | Admitting: Family

## 2013-01-28 ENCOUNTER — Ambulatory Visit: Payer: Medicare Other | Admitting: Family

## 2013-02-07 ENCOUNTER — Ambulatory Visit: Payer: Medicare Other | Admitting: Family

## 2013-02-12 ENCOUNTER — Ambulatory Visit: Payer: Medicare Other | Admitting: Family

## 2013-03-24 ENCOUNTER — Other Ambulatory Visit: Payer: Self-pay | Admitting: Family

## 2013-04-28 ENCOUNTER — Other Ambulatory Visit: Payer: Self-pay | Admitting: Family

## 2013-05-12 ENCOUNTER — Other Ambulatory Visit: Payer: Self-pay | Admitting: Family

## 2013-05-22 ENCOUNTER — Telehealth: Payer: Self-pay | Admitting: Pulmonary Disease

## 2013-05-22 NOTE — Telephone Encounter (Signed)
Pt states has been having sinus pressure, headaches and some dizzy spells x2 weeks, believes possible sinus infection. Pt has appointment 4/15.  She states Dr. Halford Chessman aslo mentioned to her having a sleep study and would like to discuss having that done. Dr. Halford Chessman please advise of rec's for this pt, thanks

## 2013-05-23 ENCOUNTER — Telehealth: Payer: Self-pay | Admitting: Pulmonary Disease

## 2013-05-23 MED ORDER — TRIAMCINOLONE ACETONIDE 55 MCG/ACT NA AERO: 2.0000 | INHALATION_SPRAY | Freq: Every day | NASAL | Status: DC

## 2013-05-23 MED ORDER — DOXYCYCLINE HYCLATE 100 MG PO TABS: 100.0000 mg | ORAL_TABLET | Freq: Two times a day (BID) | ORAL | Status: DC

## 2013-05-23 NOTE — Telephone Encounter (Signed)
Calling back requesting to speak to nurse.  [phone]

## 2013-05-23 NOTE — Telephone Encounter (Signed)
Pt aware. Nothing further needed 

## 2013-05-23 NOTE — Telephone Encounter (Signed)
Chesley Mires, MD at 05/23/2013 9:06 AM     Status: Signed        She has sinus congestion/pressure for several weeks. She also has feverish feeling with chills.  Will have her use nasal irrigation, and nasacort. Will send script for doxycycline. Advised her to call if not feeling better. Otherwise will f/u at scheduled ROV in April.  --  Called spoke with pt. She reports she took doxy in October from PCP. She is going to try what we called in and see if she feels better. She is also wanting to get scheduled for sleep study? Please advise Dr. Halford Chessman thanks

## 2013-05-23 NOTE — Telephone Encounter (Signed)
Please have her try course of doxycycline first.

## 2013-05-23 NOTE — Telephone Encounter (Signed)
She has sinus congestion/pressure for several weeks.  She also has feverish feeling with chills.    Will have her use nasal irrigation, and nasacort.  Will send script for doxycycline.  Advised her to call if not feeling better.  Otherwise will f/u at scheduled ROV in April.

## 2013-05-28 ENCOUNTER — Other Ambulatory Visit: Payer: Self-pay | Admitting: Family

## 2013-05-28 ENCOUNTER — Telehealth: Payer: Self-pay | Admitting: Family

## 2013-05-28 NOTE — Telephone Encounter (Signed)
Patient Information:  Caller Name: N/A  Phone: [phone]  Patient: Alexa Stephens, Alexa Stephens  Gender: Female  DOB: Oct 04, 1950  Age: 63 Years  PCP: [facility])  Office Follow Up:  Does the office need to follow up with this patient?: No  Instructions For The Office: N/A  RN Note:  Advised pt per EPIC : valsartan-hydrochlorothiazide (DIOVAN-HCT) 160-12.5 MG.  Pt verbalized understanding.  Symptoms  Reason For Call & Symptoms: Pt states she had her Rx refilled and the Valsartan-HCTZ 160-12.5mg   Pt states the medication looks different and she wanted to confirm the dosage listed in her chart.  Reviewed Health History In EMR: Yes  Reviewed Medications In EMR: Yes  Reviewed Allergies In EMR: Yes  Reviewed Surgeries / Procedures: Yes  Date of Onset of Symptoms: 05/28/2013  Guideline(s) Used:  No Protocol Available - Information Only  Disposition Per Guideline:   Home Care  Reason For Disposition Reached:   Information only question and nurse able to answer  Advice Given:  N/A  Patient Will Follow Care Advice:  YES

## 2013-06-05 ENCOUNTER — Other Ambulatory Visit: Payer: Self-pay | Admitting: Family

## 2013-06-11 ENCOUNTER — Other Ambulatory Visit (INDEPENDENT_AMBULATORY_CARE_PROVIDER_SITE_OTHER): Payer: Medicare Other

## 2013-06-11 ENCOUNTER — Encounter: Payer: Self-pay | Admitting: Pulmonary Disease

## 2013-06-11 ENCOUNTER — Ambulatory Visit (INDEPENDENT_AMBULATORY_CARE_PROVIDER_SITE_OTHER): Payer: Medicare Other | Admitting: Pulmonary Disease

## 2013-06-11 ENCOUNTER — Ambulatory Visit (INDEPENDENT_AMBULATORY_CARE_PROVIDER_SITE_OTHER)
Admission: RE | Admit: 2013-06-11 | Discharge: 2013-06-11 | Disposition: A | Discharge status: Home / Self Care | Payer: Medicare Other | Source: Ambulatory Visit | Attending: Pulmonary Disease | Admitting: Pulmonary Disease

## 2013-06-11 ENCOUNTER — Encounter (INDEPENDENT_AMBULATORY_CARE_PROVIDER_SITE_OTHER): Payer: Self-pay

## 2013-06-11 VITALS — BP 118/76 | HR 82 | Temp 97.8°F | Ht 62.0 in | Wt 118.8 lb

## 2013-06-11 DIAGNOSIS — J9611 Chronic respiratory failure with hypoxia: Secondary | ICD-10-CM

## 2013-06-11 DIAGNOSIS — R259 Unspecified abnormal involuntary movements: Secondary | ICD-10-CM

## 2013-06-11 DIAGNOSIS — R0902 Hypoxemia: Secondary | ICD-10-CM

## 2013-06-11 DIAGNOSIS — J449 Chronic obstructive pulmonary disease, unspecified: Secondary | ICD-10-CM

## 2013-06-11 DIAGNOSIS — D649 Anemia, unspecified: Secondary | ICD-10-CM

## 2013-06-11 DIAGNOSIS — K5900 Constipation, unspecified: Secondary | ICD-10-CM

## 2013-06-11 DIAGNOSIS — Z720 Tobacco use: Secondary | ICD-10-CM

## 2013-06-11 DIAGNOSIS — F17200 Nicotine dependence, unspecified, uncomplicated: Secondary | ICD-10-CM

## 2013-06-11 DIAGNOSIS — R251 Tremor, unspecified: Secondary | ICD-10-CM

## 2013-06-11 DIAGNOSIS — J9610 Chronic respiratory failure, unspecified whether with hypoxia or hypercapnia: Secondary | ICD-10-CM

## 2013-06-11 LAB — COMPREHENSIVE METABOLIC PANEL
ALBUMIN: 3.9 g/dL (ref 3.5–5.2)
ALK PHOS: 84 U/L (ref 39–117)
ALT: 20 U/L (ref 0–35)
AST: 24 U/L (ref 0–37)
BILIRUBIN TOTAL: 0.6 mg/dL (ref 0.3–1.2)
BUN: 7 mg/dL (ref 6–23)
CO2: 40 mEq/L — ABNORMAL HIGH (ref 19–32)
CREATININE: 0.5 mg/dL (ref 0.4–1.2)
Calcium: 9 mg/dL (ref 8.4–10.5)
Chloride: 95 mEq/L — ABNORMAL LOW (ref 96–112)
GFR: 131.96 mL/min (ref >60.00)
GLUCOSE: 97 mg/dL (ref 70–99)
POTASSIUM: 3.6 meq/L (ref 3.5–5.1)
Sodium: 139 mEq/L (ref 135–145)
Total Protein: 7.1 g/dL (ref 6.0–8.3)

## 2013-06-11 LAB — CBC
HEMATOCRIT: 33.3 % — AB (ref 36.0–46.0)
Hemoglobin: 10.9 g/dL — ABNORMAL LOW (ref 12.0–15.0)
MCHC: 32.9 g/dL (ref 30.0–36.0)
MCV: 97.7 fl (ref 78.0–100.0)
PLATELETS: 234 10*3/uL (ref 150.0–400.0)
RBC: 3.41 Mil/uL — ABNORMAL LOW (ref 3.87–5.11)
RDW: 14.2 % (ref 11.5–14.6)
WBC: 6.5 10*3/uL (ref 4.5–10.5)

## 2013-06-11 NOTE — Assessment & Plan Note (Signed)
She is to continue 2 liters oxygen 24/7. 

## 2013-06-11 NOTE — Assessment & Plan Note (Signed)
Advised her to d/w her PCP.

## 2013-06-11 NOTE — Assessment & Plan Note (Signed)
She continues to smoke.  Discussed options to helping with smoking cessation.

## 2013-06-11 NOTE — Assessment & Plan Note (Signed)
She is to continue her current inhaler regimen.

## 2013-06-11 NOTE — Progress Notes (Signed)
Chief Complaint  Patient presents with  . Follow-up    Pt needs to be recertified for O2. Breathing has been okay. Occas wheezing/chest tx. C/o prod cough w/ clear-brown colored phlem at times. c/o feeling nervous/trembly.     CC: Alexa Stephens  History of Present Illness: Alexa Stephens is a 63 y.o. female smoker with severe COPD, hypoxia, and snoring.  She has noticed feeling dizzy and weak.  She is getting chills, but denies fever.  She has lost 20 lbs since last summer.  She denies chest pain, nausea, abdominal pain, or melena.  She does c/o constipation.  She has a cough with clear to brown sputum.  She denies hemoptysis.    She is using 2 liters oxygen.  She uses her combivent 2 to 3 times per day.  Tests: Spirometry 11/01/11>>FEV1 0.48 (21%), FEV1% 37 (difficult pt effort). 11/01/11 SpO2 85% on room air at rest A1AT 06/21/12 >> 140, PI-MS  She  has a past medical history of Anxiety disorder; Asthma; COPD (chronic obstructive pulmonary disease); Headache(784.0); Hyperlipemia; Hypertension; Pneumonia (Sept 2013); and Sepsis (Sept 2013).  She  has past surgical history that includes Cosmetic surgery; Cystectomy; and Hand surgery.   Current Outpatient Prescriptions on File Prior to Visit  Medication Sig Dispense Refill  . albuterol (PROVENTIL HFA;VENTOLIN HFA) 108 (90 BASE) MCG/ACT inhaler Inhale 2 puffs into the lungs every 4 (four) hours as needed. For shortness of breath.  1 Inhaler  4  . albuterol (PROVENTIL) (5 MG/ML) 0.5% nebulizer solution Take 0.5 mLs (2.5 mg total) by nebulization every 4 (four) hours.  40 mL  5  . aspirin 81 MG tablet Take 81 mg by mouth daily as needed.      . Calcium Carbonate-Vitamin D (CALCIUM-VITAMIN D) 500-200 MG-UNIT per tablet Take 1 tablet by mouth daily. When remembers      . clonazePAM (KLONOPIN) 1 MG tablet TAKE 1 TABLET BY MOUTH TWICE A DAY AS NEEDED FOR ANXIETY  60 tablet  0  . COMBIVENT RESPIMAT 20-100 MCG/ACT AERS respimat INHALE 1 PUFF INTO THE  LUNGS EVERY 6 HOURS  4 g  2  . Multiple Vitamin (MULTIVITAMIN) tablet Take 1 tablet by mouth daily.      . simvastatin (ZOCOR) 20 MG tablet TAKE 1 TABLET BY MOUTH ONCE EVERY EVENING  30 tablet  0  . valsartan-hydrochlorothiazide (DIOVAN-HCT) 160-12.5 MG per tablet TAKE 1 TABLET BY MOUTH EVERY DAY  90 tablet  0  . venlafaxine XR (EFFEXOR-XR) 150 MG 24 hr capsule TAKE ONE CAPSULE BY MOUTH ONCE DAILY  30 capsule  0  . triamcinolone (NASACORT AQ) 55 MCG/ACT AERO nasal inhaler Place 2 sprays into the nose daily.  1 Inhaler  12   No current facility-administered medications on file prior to visit.    Allergies  Allergen Reactions  . Lexapro [Escitalopram]     Gi upset  . Naproxen     High BP  . Skelaxin [Metaxalone]    She has a  family history includes Cancer in her maternal grandmother; Lung cancer in her maternal grandfather.  She  reports that she has been smoking Cigarettes.  She has a .8 pack-year smoking history. She does not have any smokeless tobacco history on file. She reports that she does not drink alcohol or use illicit drugs.   Physical Exam:  General - No distress, wearing oxygen, thin, pale ENT - No sinus tenderness, no oral exudate, no LAN, cheliosis Cardiac - s1s2 regular, no murmur Chest - prolonged  exhalation, decreased breath sounds, no wheeze/rales Back - no focal tenderness Abd - soft, non-tender Ext - no edema, deformity right hand Neuro - normal strength Skin - no rashes Psych - normal mood, and behavior  Dg Chest 2 View  06/11/2013   CLINICAL DATA:  Cough, shortness of breath, asthma, COPD, hypertension, smoker  EXAM: CHEST  2 VIEW  COMPARISON:  12/24/2012  FINDINGS: Normal heart size, mediastinal contours, and pulmonary vascularity.  Emphysematous and bronchitic changes consistent with COPD.  Probable bilateral nipple shadows.  No acute infiltrate, pleural effusion or pneumothorax.  Osseous demineralization with thoracolumbar scoliosis.  IMPRESSION: COPD  changes.  No acute abnormalities.   Electronically Signed   By: Lavonia Dana M.D.   On: 06/11/2013 14:41   CMP     Component Value Date/Time   NA 139 06/11/2013 1436   K 3.6 06/11/2013 1436   CL 95* 06/11/2013 1436   CO2 40* 06/11/2013 1436   GLUCOSE 97 06/11/2013 1436   BUN 7 06/11/2013 1436   CREATININE 0.5 06/11/2013 1436   CALCIUM 9.0 06/11/2013 1436   PROT 7.1 06/11/2013 1436   ALBUMIN 3.9 06/11/2013 1436   AST 24 06/11/2013 1436   ALT 20 06/11/2013 1436   ALKPHOS 84 06/11/2013 1436   BILITOT 0.6 06/11/2013 1436    CBC    Component Value Date/Time   WBC 6.5 06/11/2013 1436   RBC 3.41* 06/11/2013 1436   HGB 10.9* 06/11/2013 1436   HCT 33.3* 06/11/2013 1436   PLT 234.0 06/11/2013 1436   MCV 97.7 06/11/2013 1436   MCHC 32.9 06/11/2013 1436   RDW 14.2 06/11/2013 1436     Assessment/Plan:  Chesley Mires, MD [facility] Pager:  [phone] 06/11/2013, 1:58 PM

## 2013-06-11 NOTE — Patient Instructions (Signed)
Follow up in 6 months 

## 2013-06-11 NOTE — Assessment & Plan Note (Signed)
She has mild anemia.  She is to discuss with her PCP about whether additional evaluation is needed.

## 2013-07-02 ENCOUNTER — Encounter: Payer: Self-pay | Admitting: Family Medicine

## 2013-07-02 ENCOUNTER — Ambulatory Visit (INDEPENDENT_AMBULATORY_CARE_PROVIDER_SITE_OTHER): Payer: Medicare Other | Admitting: Family Medicine

## 2013-07-02 VITALS — BP 100/61 | HR 110 | Ht 62.0 in | Wt 110.0 lb

## 2013-07-02 DIAGNOSIS — A499 Bacterial infection, unspecified: Secondary | ICD-10-CM

## 2013-07-02 DIAGNOSIS — K2970 Gastritis, unspecified, without bleeding: Secondary | ICD-10-CM

## 2013-07-02 DIAGNOSIS — J329 Chronic sinusitis, unspecified: Secondary | ICD-10-CM

## 2013-07-02 DIAGNOSIS — K2990 Gastroduodenitis, unspecified, without bleeding: Secondary | ICD-10-CM

## 2013-07-02 DIAGNOSIS — I10 Essential (primary) hypertension: Secondary | ICD-10-CM

## 2013-07-02 DIAGNOSIS — B9689 Other specified bacterial agents as the cause of diseases classified elsewhere: Secondary | ICD-10-CM

## 2013-07-02 MED ORDER — AMOXICILLIN-POT CLAVULANATE 500-125 MG PO TABS: ORAL_TABLET | ORAL | Status: AC

## 2013-07-02 MED ORDER — RANITIDINE HCL 150 MG PO TABS: ORAL_TABLET | ORAL | Status: DC

## 2013-07-02 MED ORDER — VALSARTAN 160 MG PO TABS: 160.0000 mg | ORAL_TABLET | Freq: Every day | ORAL | Status: DC

## 2013-07-02 NOTE — Progress Notes (Signed)
CC: Alexa Stephens is a 63 y.o. female is here for Establish Care   Subjective: HPI:  Pleasant 63 year old here to establish care  Patient complains of facial pressure localized beneath both eyes that has been present for about a week accompanied by fevers and chills now moderate in severity worsening on a daily basis. Present all hours of the day but worse first thing in the morning.  Accompanied by thick nasal discharge despite using nasal saline washes. Accompanied by sore throat when swallowing along with what she believes is swollen glands in her neck. No interventions other than above  Complains of epigastric pain that has been present off and on for the past year. It is worse after taking valsartan-hydrochlorothiazide. It is also worse with spicy food or sitting foods. Is described only has pain, mild in severity, nonradiating. Overall has not gotten better or worse for the past year. No interventions as of yet.  Denies jaundice, nausea, vomiting.   Review of Systems - General ROS: negative for -  night sweats, weight gain or weight loss Ophthalmic ROS: negative for - decreased vision Psychological ROS: negative for - anxiety or depression ENT ROS: negative for - hearing change,tinnitus or allergies Hematological and Lymphatic ROS: negative for - bleeding problems, bruising  Breast ROS: negative Respiratory ROS: no  shortness of breath, or wheezing Cardiovascular ROS: no chest pain or dyspnea on exertion Gastrointestinal ROS: no  change in bowel habits, or black or bloody stools Genito-Urinary ROS: negative for - genital discharge, genital ulcers, incontinence or abnormal bleeding from genitals Musculoskeletal ROS: negative for - joint pain or muscle pain Neurological ROS: negative for - headaches or memory loss Dermatological ROS: negative for lumps, mole changes, rash and skin lesion changes  Past Medical History  Diagnosis Date  . Anxiety disorder   . Asthma   . COPD (chronic  obstructive pulmonary disease)     Not on home O2 yet but is prescribed.  . Headache(784.0)   . Hyperlipemia   . Hypertension   . Pneumonia Sept 2013  . Sepsis Sept 2013    Past Surgical History  Procedure Laterality Date  . Cosmetic surgery    . Cystectomy    . Hand surgery     Family History  Problem Relation Age of Onset  . Lung cancer Maternal Grandfather   . Cancer Maternal Grandmother     History   Social History  . Marital Status: Single    Spouse Name: N/A    Number of Children: N/A  . Years of Education: N/A   Occupational History  . retired    Social History Main Topics  . Smoking status: Current Every Day Smoker -- 0.02 packs/day for 40 years    Types: Cigarettes  . Smokeless tobacco: Not on file     Comment: 1 pack per 3-4 days 06/11/13  . Alcohol Use: No  . Drug Use: No  . Sexual Activity: Not on file   Other Topics Concern  . Not on file   Social History Narrative  . No narrative on file     Objective: BP 100/61  Pulse 110  Ht 5\' 2"  (1.575 m)  Wt 110 lb (49.896 kg)  BMI 20.11 kg/m2   General: Alert and Oriented, No Acute Distress HEENT: Pupils equal, round, reactive to light. Conjunctivae clear.  External ears unremarkable, canals clear with intact TMs with appropriate landmarks.  Middle ear appears to have mild serous effusion bilaterally. Pink inferior turbinates.  Moist mucous membranes,  pharynx without inflammation nor lesions however moderate cobblestoning.  Neck supple without palpable lymphadenopathy nor abnormal masses. Lungs: Clear to auscultation bilaterally, no wheezing/ronchi/rales.  Comfortable work of breathing. Good air movement. Cardiac: Regular rate and rhythm. Normal S1/S2.  No murmurs, rubs, nor gallops.   Abdomen: Normal bowel sounds, soft without palpable masses nor guarding. Negative rebound. Pain is reproduced with epigastric palpation but no pain in all other quadrants Extremities: No peripheral edema.  Strong peripheral  pulses.  Mental Status: No depression, anxiety, nor agitation. Skin: Warm and dry.  Assessment & Plan: Edona was seen today for establish care.  Diagnoses and associated orders for this visit:  Essential hypertension, benign - valsartan (DIOVAN) 160 MG tablet; Take 1 tablet (160 mg total) by mouth daily.  Bacterial sinusitis - amoxicillin-clavulanate (AUGMENTIN) 500-125 MG per tablet; Take one by mouth every 8 hours for ten total days.  Gastritis - ranitidine (ZANTAC) 150 MG tablet; One by mouth twice a day to prevent gastric reflux symptoms.    Gastritis: Start ranitidine twice a day Bacterial sinusitis: Start Augmentin Essential hypertension: Since abdominal pain is worsened with her current valsartan-hydrochlorothiazide and blood pressure is extremely well controlled will begin a trial of just valsartan, recheck blood pressure within the next 3 months, return sooner if blood pressure outside of our office as above 140/90  Return in about 3 months (around 10/02/2013).

## 2013-07-04 ENCOUNTER — Telehealth: Payer: Self-pay | Admitting: Pulmonary Disease

## 2013-07-04 MED ORDER — PREDNISONE 10 MG PO TABS: ORAL_TABLET | ORAL | Status: DC

## 2013-07-04 NOTE — Telephone Encounter (Signed)
Called spoke with patient who reports sore throat, dyspnea, wheezing, cough occasionally productive, night sweats, fever (100.1) x1 week.  Saw new PCP on 5.6.15 and was rx'd Augmentin.  Denies hemoptysis, nausea, vomiting, edema.  As of now pt cannot tell any improvement.  Pt also mentioned refills on her Combivent and Ventolin HFA but declined needing a refill sent to her pharmacy at this exact time.  Dr Halford Chessman please advise if you have any additional recommendations for pt.  Thank you.

## 2013-07-04 NOTE — Telephone Encounter (Signed)
Called spoke with patient and advised her of VS' recommendations as stated below.  Pt okay with these recommendations and verbalized her understanding.  Pt advised to call the office if her symptoms do not improve or they worsen thru the weekend.  Rx sent to verified pharmacy.  Nothing further needed at this time; will sign off.

## 2013-07-04 NOTE — Telephone Encounter (Signed)
She should continue course of augmentin.  Please send script for prednisone 10 mg pill >> 3 pills for 2 days, 2 pills for 2 days, 1 pill for 2 days.  Dispense 12 pills with no refills.  Advised her to call back if not feeling better through the weekend.

## 2013-07-15 ENCOUNTER — Other Ambulatory Visit: Payer: Self-pay | Admitting: Family Medicine

## 2013-07-15 ENCOUNTER — Telehealth: Payer: Self-pay | Admitting: *Deleted

## 2013-07-15 NOTE — Telephone Encounter (Signed)
Pt called and transferred to my extension wanting advice about what to do for a stuffy nose. She states she has tried saline and that hasn't helped. She states she is not able to sleep at night. Advised pt that she can try the coricidin line of products for congestion since she has hypertension

## 2013-07-15 NOTE — Telephone Encounter (Signed)
Pt called again and was transferred to my line again. She asked for "something to clear her nasal passages." I advised her that if does not want to use saline nor try the otc coricidin that we discuss earlier then she needs to schedule appt. Pt then asked for a rx for percocet because she coughs so hard her back hurts.I told her that narcotics require an office visit. Pt then starts to ramble about needing pain medications and if Dr. Ileene Rubens was not "capable of writing those types of medications" then she needed to find a doctor who will.She said her last " pain clinic rx'ed those meds"to her. I told her that Dr. Ileene Rubens does manage chronic narcotics and if she needed to get into another pain then he can send in a referral.

## 2013-07-15 NOTE — Telephone Encounter (Signed)
Will address at hospital f/u tomorow

## 2013-07-16 ENCOUNTER — Ambulatory Visit: Payer: Medicare Other | Admitting: Family Medicine

## 2013-07-16 ENCOUNTER — Ambulatory Visit (INDEPENDENT_AMBULATORY_CARE_PROVIDER_SITE_OTHER): Payer: Medicare Other | Admitting: Family Medicine

## 2013-07-16 ENCOUNTER — Encounter: Payer: Self-pay | Admitting: Family Medicine

## 2013-07-16 VITALS — BP 146/79 | HR 108 | Temp 98.2°F | Wt 114.0 lb

## 2013-07-16 DIAGNOSIS — H6091 Unspecified otitis externa, right ear: Secondary | ICD-10-CM

## 2013-07-16 DIAGNOSIS — J329 Chronic sinusitis, unspecified: Secondary | ICD-10-CM

## 2013-07-16 DIAGNOSIS — H60399 Other infective otitis externa, unspecified ear: Secondary | ICD-10-CM

## 2013-07-16 DIAGNOSIS — M549 Dorsalgia, unspecified: Secondary | ICD-10-CM

## 2013-07-16 DIAGNOSIS — J441 Chronic obstructive pulmonary disease with (acute) exacerbation: Secondary | ICD-10-CM

## 2013-07-16 MED ORDER — OXYCODONE-ACETAMINOPHEN 5-325 MG PO TABS: 1.0000 | ORAL_TABLET | Freq: Three times a day (TID) | ORAL | Status: DC | PRN

## 2013-07-16 MED ORDER — IPRATROPIUM BROMIDE 0.02 % IN SOLN: 500.0000 ug | Freq: Four times a day (QID) | RESPIRATORY_TRACT | Status: DC | PRN

## 2013-07-16 MED ORDER — ALBUTEROL SULFATE HFA 108 (90 BASE) MCG/ACT IN AERS: 2.0000 | INHALATION_SPRAY | RESPIRATORY_TRACT | Status: DC | PRN

## 2013-07-16 MED ORDER — NEOMYCIN-POLYMYXIN-HC 1 % OT SOLN: OTIC | Status: AC

## 2013-07-16 MED ORDER — LEVOFLOXACIN 500 MG PO TABS: 500.0000 mg | ORAL_TABLET | Freq: Every day | ORAL | Status: DC

## 2013-07-16 NOTE — Progress Notes (Signed)
CC: Alexa Stephens is a 63 y.o. female is here for hospital f/u   Subjective: HPI:  Emergency room followup, [facility], May 15. She presented due to shortness of breath and worsening cough. Workup included a x-ray with emphysematous changes and no acute processes. CT of the head was obtained showing no intracranial acute process but did reflect ethmoid air-fluid levels. Blood work included a normal white count, hemoglobin of 11, normal kidney function, normal blood sugar, negative troponin.  She tells that her symptoms were significantly improved after receiving duo nebs x2.  Arterial blood gas was significant for oxygen at 130 and CO2 in the 70s on what I believe was 2-3 L of oxygen, there is some uncertainty about this. She was sent home on another prednisone taper with meclizine due to dizziness.  Since being home she reports slowly worsening with respect to increased productive cough and shortness of breath. Symptoms are significantly improved however only temporarily with albuterol nebulizer.  She denies fevers, chills, chest pain, bloody mucous. Review of systems is positive for puslike discharge from the nose and bleeding from the right ear. She admits that she has been taking her right ear frequently with long fingernails. Review of systems is also positive for moderate to severe central back pain only present when coughing, she was given some Percocet at the hospital which has significantly helped this pain however she is out of medication.   Review Of Systems Outlined In HPI  Past Medical History  Diagnosis Date  . Anxiety disorder   . Asthma   . COPD (chronic obstructive pulmonary disease)     Not on home O2 yet but is prescribed.  . Headache(784.0)   . Hyperlipemia   . Hypertension   . Pneumonia Sept 2013  . Sepsis Sept 2013    Past Surgical History  Procedure Laterality Date  . Cosmetic surgery    . Cystectomy    . Hand surgery     Family History  Problem Relation Age  of Onset  . Lung cancer Maternal Grandfather   . Cancer Maternal Grandmother     History   Social History  . Marital Status: Single    Spouse Name: N/A    Number of Children: N/A  . Years of Education: N/A   Occupational History  . retired    Social History Main Topics  . Smoking status: Current Every Day Smoker -- 0.02 packs/day for 40 years    Types: Cigarettes  . Smokeless tobacco: Not on file     Comment: 1 pack per 3-4 days 06/11/13  . Alcohol Use: No  . Drug Use: No  . Sexual Activity: Not on file   Other Topics Concern  . Not on file   Social History Narrative  . No narrative on file     Objective: BP 146/79  Pulse 108  Temp(Src) 98.2 F (36.8 C) (Oral)  Wt 114 lb (51.71 kg)  General: Alert and Oriented, No Acute Distress HEENT: Pupils equal, round, reactive to light. Conjunctivae clear.  External ears unremarkable, left canals clear with intact TM with appropriate landmarks, right canal has mild erythema and dried blood with normal tympanic membrane and an appropriate landmarks.  Left Middle ear appears to have a moderate serous effusion. Right middle ear appears open without effusion. Erythematous inferior turbinates with moderate dried blood in the naris, moderate perforated septum..  Moist mucous membranes, pharynx without inflammation nor lesions.  Neck supple without palpable lymphadenopathy nor abnormal masses. Lungs:  Distant breath sounds without rhonchi nor rales. Mild to moderate end expiratory wheezing in all lung fields.  Cardiac: Regular rate and rhythm. Normal S1/S2.  No murmurs, rubs, nor gallops.   Extremities: No peripheral edema.  Strong peripheral pulses.  Mental Status: No depression, anxiety, nor agitation. Skin: Warm and dry.  Assessment & Plan: [name] was seen today for hospital f/u.  Diagnoses and associated orders for this visit:  Sinus infection - levofloxacin (LEVAQUIN) 500 MG tablet; Take 1 tablet (500 mg total) by mouth  daily.  COPD exacerbation - ipratropium (ATROVENT) 0.02 % nebulizer solution; Take 2.5 mLs (500 mcg total) by nebulization every 6 (six) hours as needed. - albuterol (PROVENTIL HFA;VENTOLIN HFA) 108 (90 BASE) MCG/ACT inhaler; Inhale 2 puffs into the lungs every 4 (four) hours as needed. For shortness of breath.  Back pain - oxyCODONE-acetaminophen (ROXICET) 5-325 MG per tablet; Take 1 tablet by mouth every 8 (eight) hours as needed for severe pain.  Right otitis externa - NEOMYCIN-POLYMYXIN-HYDROCORTISONE (CORTISPORIN) 1 % SOLN otic solution; Four drops in affected ear(s) three times a day, keep in ear(s) for five minutes. Total of ten days.    Sinus infection: Start levofloxacin, continue prednisone taper COPD exacerbation: Continue prednisone taper, hopefully levofloxacin will help with a somewhat. I've asked her to use nebulizers scheduled every 4-6 hours for next 48 hours and then as needed he should use both albuterol and ipratropium. Neck pain: Start Percocet only as if her back pain discussed that this is not to be a chronically prescribe medication Right otitis externa: Start Cortisporin, keep foreign bodies out of the ear   Return if symptoms worsen or fail to improve.

## 2013-07-17 ENCOUNTER — Telehealth: Payer: Self-pay | Admitting: Family Medicine

## 2013-07-17 DIAGNOSIS — J441 Chronic obstructive pulmonary disease with (acute) exacerbation: Secondary | ICD-10-CM

## 2013-07-17 MED ORDER — IPRATROPIUM BROMIDE 0.02 % IN SOLN: 500.0000 ug | Freq: Four times a day (QID) | RESPIRATORY_TRACT | Status: DC | PRN

## 2013-07-17 NOTE — Telephone Encounter (Signed)
Alexa Stephens is not covering ipratropium for [name] because it needs to be billed under Medicare part B.  Can you please call [facility] and see if they are able to run the Rx through Medicare Part B, if they are unable to can you please see who [name] uses for her nebulizer/respiratory supplies and see if they can manage her ipratopium through Medicare Part B?

## 2013-07-17 NOTE — Telephone Encounter (Signed)
Reprinted rx with dx code; pharmacy tech states specific criteria have to be on the rx

## 2013-07-22 ENCOUNTER — Telehealth: Payer: Self-pay | Admitting: Pulmonary Disease

## 2013-07-22 DIAGNOSIS — J441 Chronic obstructive pulmonary disease with (acute) exacerbation: Secondary | ICD-10-CM

## 2013-07-22 NOTE — Telephone Encounter (Signed)
LMOM x 1 

## 2013-07-23 ENCOUNTER — Telehealth: Payer: Self-pay | Admitting: *Deleted

## 2013-07-23 DIAGNOSIS — J441 Chronic obstructive pulmonary disease with (acute) exacerbation: Secondary | ICD-10-CM

## 2013-07-23 MED ORDER — IPRATROPIUM BROMIDE 0.02 % IN SOLN: 500.0000 ug | Freq: Four times a day (QID) | RESPIRATORY_TRACT | Status: DC | PRN

## 2013-07-23 NOTE — Telephone Encounter (Signed)
Pt states she would like ipratropium sent to [facility] [address] in [place] because its on the $4 dollar list

## 2013-07-23 NOTE — Telephone Encounter (Signed)
Called spoke with patient, informed her of VS's recommendations as stated below.  Duoneb sent to [facility] in [place].  Pt verbalized her understanding regarding VS's other recommendations but now requests something for her cough - advised pt that if VS okays a prescription cough syrup someone will need to come to the office to pick up the rx.  Pt verbalized her understanding of this.  Dr Halford Chessman please advise if pt may have a cough syrup.  Thank you.

## 2013-07-23 NOTE — Telephone Encounter (Signed)
Called spoke with patient who has several concerns: 1) She was seen at [facility] on 5.16.15 and was given Duoneb neb treatments.  She felt this medication worked well for her and would like to be able to have this at home.  She did see her PCP [name] DO and was given a script for this.  However, it is not covered under [employer] and requires a PA under Medicare Part B.  I did inform pt that this medication is on the $4 List at [facility] and she would like the rx sent to [facility] in [place].  Dr Halford Chessman, would you be okay with sending this rx as it was originally done by pt's PCP?   2) Pt has been having "bad allergies" x2-3 weeks with sinus congestion, bilateral ear congestion, PND, wheezing, increased SOB, prod cough with clear mucus.  Pt denies any f/c/s, hemoptysis, tightness, nausea, vomiting.  She took her last Levaquin 500mg  #8 given by her PCP on 5.20.15.  3) Pt's son had bought her a Fast Acting Nasal Spray w/ phenylephrine hcl 1% to take q4h prn.  Pt has not taken any of this and would like to know if this is a medication that she may take.  Dr Halford Chessman please advise, thank you.

## 2013-07-23 NOTE — Telephone Encounter (Signed)
LMTCB

## 2013-07-23 NOTE — Telephone Encounter (Signed)
Spoke with pt, told her that Dr. Halford Chessman had OK'ed the cough syrup.  She said that she has changed her mind and does not want this rx at this time.  I advised her to call back if she changes her mind.  Nothing further needed.

## 2013-07-23 NOTE — Telephone Encounter (Signed)
Pt returning call.Alexa Stephens ° °

## 2013-07-23 NOTE — Telephone Encounter (Signed)
Okay to send order for duoneb to [facility].  She can use fast acting nasal spray as needed for up to 2 days, but no more than this.  She should continue with nasacort two sprays each nostril daily.  She can also use OTC anti-histamine (claritin/allegra/zyrtec >> pt preference).

## 2013-07-23 NOTE — Telephone Encounter (Signed)
Okay to send order for tussionex 5 ml every 12 hours prn, dispense 115 ml with no refills.

## 2013-07-30 ENCOUNTER — Other Ambulatory Visit: Payer: Self-pay | Admitting: Family

## 2013-08-01 ENCOUNTER — Telehealth: Payer: Self-pay | Admitting: *Deleted

## 2013-08-01 MED ORDER — DOXYCYCLINE HYCLATE 100 MG PO TABS: ORAL_TABLET | ORAL | Status: DC

## 2013-08-01 NOTE — Telephone Encounter (Signed)
Pt called and states her sinuses are still bothering her. She is coughing and not able to sleep at night.Per [facility] ok to call in doxy 100 mg  Bid x 10 days

## 2013-08-19 ENCOUNTER — Telehealth: Payer: Self-pay | Admitting: Pulmonary Disease

## 2013-08-19 NOTE — Telephone Encounter (Signed)
Called spoke with pt. She reports she had a call from our office on her phone. Did not say who called or in what this was in regards to. I do not see anything in epic where anyone called pt. She wants me to ask Dr. Halford Chessman if he tried to reach her. Please advise thanks

## 2013-08-19 NOTE — Telephone Encounter (Signed)
I am not sure what this is in reference too.  I did not try to call her.

## 2013-08-19 NOTE — Telephone Encounter (Signed)
Called made pt aware. Nothing further needed 

## 2013-08-19 NOTE — Telephone Encounter (Signed)
None of the PCC's have attempted to contact patient. Rhonda J Cobb

## 2013-08-19 NOTE — Telephone Encounter (Signed)
Will just check with PCC's as well. Please advise thanks

## 2013-08-20 ENCOUNTER — Other Ambulatory Visit: Payer: Self-pay

## 2013-08-20 ENCOUNTER — Telehealth: Payer: Self-pay | Admitting: *Deleted

## 2013-08-20 ENCOUNTER — Other Ambulatory Visit: Payer: Self-pay | Admitting: Family

## 2013-08-20 MED ORDER — SIMVASTATIN 20 MG PO TABS: 20.0000 mg | ORAL_TABLET | Freq: Every day | ORAL | Status: DC

## 2013-08-20 NOTE — Telephone Encounter (Signed)
Pt called and requested a refill on simvastatin.Pt did have a lipid panel done in sept of last year at [facility] office. Advised pt via vm to schedule a f/u with [facility] so that we can recheck labs to see if medication needs to be adjusted

## 2013-08-21 NOTE — Telephone Encounter (Signed)
No medications are marked as refill requested.

## 2013-08-23 ENCOUNTER — Other Ambulatory Visit: Payer: Self-pay | Admitting: Family

## 2013-08-25 ENCOUNTER — Telehealth: Payer: Self-pay | Admitting: *Deleted

## 2013-08-25 NOTE — Telephone Encounter (Signed)
[facility] with [facility] of [facility]epam and that she would need to make a closer follow up to receive her meds. She is scheduled in August but when this med was refilled in April she was told no further refills without office appt. Margette Fast. CMA

## 2013-08-26 ENCOUNTER — Other Ambulatory Visit: Payer: Self-pay

## 2013-08-26 MED ORDER — CLONAZEPAM 1 MG PO TABS: 1.0000 mg | ORAL_TABLET | Freq: Two times a day (BID) | ORAL | Status: DC

## 2013-08-26 NOTE — Telephone Encounter (Signed)
Spoke with patient today about refill for Klonopin. I informed patient that she need to make and appointment earlier to the August appointment that she has. I schedule Alexa Stephens for a follow up on July 7 with Dr. Ileene Rubens. I filled her Klonopin with only 12 tablets and 0 refills. That is enough to get her here to be seen.  Josiah Lobo, CMA

## 2013-09-02 ENCOUNTER — Telehealth: Payer: Self-pay | Admitting: Family Medicine

## 2013-09-02 ENCOUNTER — Encounter: Payer: Self-pay | Admitting: Family Medicine

## 2013-09-02 ENCOUNTER — Ambulatory Visit (INDEPENDENT_AMBULATORY_CARE_PROVIDER_SITE_OTHER): Payer: Medicare Other | Admitting: Family Medicine

## 2013-09-02 VITALS — BP 137/76 | HR 85 | Wt 108.0 lb

## 2013-09-02 DIAGNOSIS — F419 Anxiety disorder, unspecified: Principal | ICD-10-CM | POA: Insufficient documentation

## 2013-09-02 DIAGNOSIS — F341 Dysthymic disorder: Secondary | ICD-10-CM

## 2013-09-02 DIAGNOSIS — E785 Hyperlipidemia, unspecified: Secondary | ICD-10-CM

## 2013-09-02 DIAGNOSIS — Z1211 Encounter for screening for malignant neoplasm of colon: Secondary | ICD-10-CM

## 2013-09-02 DIAGNOSIS — Z1239 Encounter for other screening for malignant neoplasm of breast: Secondary | ICD-10-CM

## 2013-09-02 DIAGNOSIS — F329 Major depressive disorder, single episode, unspecified: Secondary | ICD-10-CM

## 2013-09-02 DIAGNOSIS — R634 Abnormal weight loss: Secondary | ICD-10-CM

## 2013-09-02 MED ORDER — FLUOXETINE HCL 20 MG PO TABS: 20.0000 mg | ORAL_TABLET | Freq: Every day | ORAL | Status: DC

## 2013-09-02 MED ORDER — ALPRAZOLAM 0.25 MG PO TABS: 0.2500 mg | ORAL_TABLET | Freq: Two times a day (BID) | ORAL | Status: DC | PRN

## 2013-09-02 NOTE — Progress Notes (Signed)
CC: Alexa Stephens is a 62 y.o. female is here for f/u anxiety   Subjective: HPI:  Followup hyperlipidemia: Continues to take simvastatin on a daily basis without upper quadrant pain nor myalgias. Back in October she had a hepatic function panel which was normal. It's been almost a year since her cholesterol was checked last.  Followup anxiety and depression: States that she has not been using Klonopin for anxiety because it causes drowsiness that is intolerable and will last up to a day with only one dose. It does help with anxiety however side effects of drowsiness are unbearable.  She states that she's currently dealing with daily anxiety that is of moderate severity worsened by financial troubles. Nothing else seems to make symptoms better or worse. Described only as anxiety. She also believes that she could possibly be suffering from depression. She describes these symptoms as fatigue, lack of appetite, irregular sleep patterns, loss of hobbies. Denies thoughts wanting to harm herself or others  She tells me that over the past year she's lost what she believes is 40 pounds completely unintentionally. She tells me is accompanied by lack of appetite, she eats only because she feels she has to to prevent further weight loss. She denies any abdominal pain, nausea, nor food aversion.  She eats 3 meals a day but does not snack.  She denies any difficulty with fighting infections other than COPD exacerbations.  There has been no diarrhea, constipation, vomiting.   Review Of Systems Outlined In HPI  Past Medical History  Diagnosis Date  . Anxiety disorder   . Asthma   . COPD (chronic obstructive pulmonary disease)     Not on home O2 yet but is prescribed.  . Headache(784.0)   . Hyperlipemia   . Hypertension   . Pneumonia Sept 2013  . Sepsis Sept 2013    Past Surgical History  Procedure Laterality Date  . Cosmetic surgery    . Cystectomy    . Hand surgery     Family History  Problem  Relation Age of Onset  . Lung cancer Maternal Grandfather   . Cancer Maternal Grandmother     History   Social History  . Marital Status: Single    Spouse Name: N/A    Number of Children: N/A  . Years of Education: N/A   Occupational History  . retired    Social History Main Topics  . Smoking status: Current Every Day Smoker -- 0.02 packs/day for 40 years    Types: Cigarettes  . Smokeless tobacco: Not on file     Comment: 1 pack per 3-4 days 06/11/13  . Alcohol Use: No  . Drug Use: No  . Sexual Activity: Not on file   Other Topics Concern  . Not on file   Social History Narrative  . No narrative on file     Objective: BP 137/76  Pulse 85  Wt 108 lb (48.988 kg)  General: Alert and Oriented, No Acute Distress HEENT: Pupils equal, round, reactive to light. Conjunctivae clear.  External ears unremarkable  Moist mucous membranes, pharynx without inflammation nor lesions.  Neck supple without palpable lymphadenopathy nor abnormal masses. Lungs: Distant breath sounds all lung fields. No wheezing rhonchi nor rales Cardiac: Regular rate and rhythm. Normal S1/S2.  No murmurs, rubs, nor gallops.   Abdomen: Soft nontender Extremities: No peripheral edema.  Strong peripheral pulses.  Mental Status: No depression, anxiety, nor agitation. Skin: Warm and dry.  Assessment & Plan: Alexa Stephens was seen  today for f/u anxiety.  Diagnoses and associated orders for this visit:  Anxiety and depression - FLUoxetine (PROZAC) 20 MG tablet; Take 1 tablet (20 mg total) by mouth daily. - ALPRAZolam (XANAX) 0.25 MG tablet; Take 1 tablet (0.25 mg total) by mouth 2 (two) times daily as needed for anxiety.  HLD (hyperlipidemia) - Lipid panel  Unintentional weight loss - TSH - T4, free - Hepatitis C antibody - HIV antibody  Breast cancer screening - MM DIGITAL SCREENING BILATERAL; Future    Anxiety and depression: Uncontrolled, start Prozac, stop clonazepam instead begin as needed Xanax for  shorter half-life. Followup one month for recheck if symptoms Hyperlipidemia: Due for lipid panel Unintentional weight loss: Ruling out hyperthyroidism, she gives verbal consent for HIV test and hepatitis C testing.  She's overdue for routine colonoscopy therefore referral has been placed, additionally referral has been placed for mammogram, I encouraged her to followup at her convenience for a Pap smear given that were already running over during today's visit  40 minutes spent face-to-face during visit today of which at least 50% was counseling or coordinating care regarding: 1. Anxiety and depression   2. HLD (hyperlipidemia)   3. Unintentional weight loss   4. Breast cancer screening      Return in about 4 weeks (around 09/30/2013).

## 2013-09-02 NOTE — Telephone Encounter (Signed)
Alexa Stephens please let the patient know that it overdue for colonoscopy based on what I can see in her medical record. I placed a referral for colonoscopy for routine bowel cancer screening.

## 2013-09-03 NOTE — Telephone Encounter (Signed)
Unable to leave vm.

## 2013-09-04 LAB — HIV ANTIBODY (ROUTINE TESTING W REFLEX): HIV 1&2 Ab, 4th Generation: NONREACTIVE

## 2013-09-04 LAB — T4, FREE: Free T4: 1.06 ng/dL (ref 0.80–1.80)

## 2013-09-04 LAB — LIPID PANEL
CHOL/HDL RATIO: 2.6 ratio
CHOLESTEROL: 151 mg/dL (ref 0–200)
HDL: 59 mg/dL (ref >39)
LDL CALC: 79 mg/dL (ref 0–99)
TRIGLYCERIDES: 64 mg/dL (ref <150)
VLDL: 13 mg/dL (ref 0–40)

## 2013-09-04 LAB — HEPATITIS C ANTIBODY: HCV Ab: NEGATIVE

## 2013-09-04 LAB — TSH: TSH: 1.176 u[IU]/mL (ref 0.350–4.500)

## 2013-09-05 NOTE — Telephone Encounter (Signed)
Pt has been notified.

## 2013-09-14 ENCOUNTER — Other Ambulatory Visit: Payer: Self-pay | Admitting: Family

## 2013-09-18 ENCOUNTER — Other Ambulatory Visit: Payer: Self-pay | Admitting: Family Medicine

## 2013-09-19 ENCOUNTER — Telehealth: Payer: Self-pay | Admitting: Pulmonary Disease

## 2013-09-19 NOTE — Telephone Encounter (Signed)
Does not look like Dr. Halford Chessman has ever refilled combivent resp for pt. Last refill was by [name] DO 07/31/13. Please advise Dr. Halford Chessman thanks  Current Outpatient Prescriptions on File Prior to Visit  Medication Sig Dispense Refill  . albuterol (PROVENTIL HFA;VENTOLIN HFA) 108 (90 BASE) MCG/ACT inhaler Inhale 2 puffs into the lungs every 4 (four) hours as needed. For shortness of breath.  1 Inhaler  4  . albuterol (PROVENTIL) (5 MG/ML) 0.5% nebulizer solution Take 0.5 mLs (2.5 mg total) by nebulization every 4 (four) hours.  40 mL  5  . ALPRAZolam (XANAX) 0.25 MG tablet Take 1 tablet (0.25 mg total) by mouth 2 (two) times daily as needed for anxiety.  20 tablet  0  . aspirin 81 MG tablet Take 81 mg by mouth daily as needed.      . Calcium Carbonate-Vitamin D (CALCIUM-VITAMIN D) 500-200 MG-UNIT per tablet Take 1 tablet by mouth daily. When remembers      . COMBIVENT RESPIMAT 20-100 MCG/ACT AERS respimat INHALE 1 PUFF INTO THE LUNGS EVERY 6 HOURS  1 Inhaler  2  . FLUoxetine (PROZAC) 20 MG tablet Take 1 tablet (20 mg total) by mouth daily.  30 tablet  3  . ipratropium (ATROVENT) 0.02 % nebulizer solution Take 2.5 mLs (500 mcg total) by nebulization every 6 (six) hours as needed (DX: COPD (496)).  75 mL  5  . Multiple Vitamin (MULTIVITAMIN) tablet Take 1 tablet by mouth daily.      Marland Kitchen oxyCODONE-acetaminophen (ROXICET) 5-325 MG per tablet Take 1 tablet by mouth every 8 (eight) hours as needed for severe pain.  30 tablet  0  . ranitidine (ZANTAC) 150 MG tablet One by mouth twice a day to prevent gastric reflux symptoms.  60 tablet  2  . simvastatin (ZOCOR) 20 MG tablet TAKE 1 TABLET BY MOUTH EVERY DAY  30 tablet  0  . triamcinolone (NASACORT AQ) 55 MCG/ACT AERO nasal inhaler Place 2 sprays into the nose daily.  1 Inhaler  12  . valsartan (DIOVAN) 160 MG tablet Take 1 tablet (160 mg total) by mouth daily.  30 tablet  5   No current facility-administered medications on file prior to visit.

## 2013-09-19 NOTE — Telephone Encounter (Signed)
Okay to send refill for combivent.

## 2013-09-22 MED ORDER — IPRATROPIUM-ALBUTEROL 20-100 MCG/ACT IN AERS: INHALATION_SPRAY | RESPIRATORY_TRACT | Status: DC

## 2013-09-22 NOTE — Telephone Encounter (Signed)
RX has been sent and nothing further needed

## 2013-09-26 ENCOUNTER — Telehealth: Payer: Self-pay

## 2013-09-26 ENCOUNTER — Other Ambulatory Visit: Payer: Self-pay | Admitting: Family Medicine

## 2013-09-26 NOTE — Telephone Encounter (Signed)
Pt called and stated that she has been feeling tired,shakey and weak since she has been on the Diovan. I asked her how long she has been on the Diovan and she said for a while so I looked in her chart to find out how long and she has been on the Diovan sine 07/02/2013. She stated that she feels like this every time after she takes it. I asked her how long after she takes the Diovan does she start to feel like this and she started that she really didn't know maybe about 30 minutes. I told her that its takes the medication a litlle longer then that to get into your system. She also stated that she is using her inhaler more because of this but she also stated that she is going outside a lot more. i told her that she is probably using her inhaler a lot more because it is extremely hot outside and she has COPD and she really doesn't need to be outside on those hot days. I made her make an appointment with Dr. Ileene Rubens to discuss her medication, and her symptoms./Hanin Decook,CMA

## 2013-10-03 ENCOUNTER — Ambulatory Visit: Payer: Medicare Other | Admitting: Family Medicine

## 2013-10-06 ENCOUNTER — Encounter: Payer: Self-pay | Admitting: Family Medicine

## 2013-10-08 ENCOUNTER — Encounter: Payer: Self-pay | Admitting: Family Medicine

## 2013-10-08 ENCOUNTER — Ambulatory Visit (INDEPENDENT_AMBULATORY_CARE_PROVIDER_SITE_OTHER): Payer: Medicare Other | Admitting: Family Medicine

## 2013-10-08 ENCOUNTER — Ambulatory Visit: Payer: Medicare Other | Admitting: Family Medicine

## 2013-10-08 VITALS — BP 128/78 | HR 84 | Wt 102.0 lb

## 2013-10-08 DIAGNOSIS — J209 Acute bronchitis, unspecified: Secondary | ICD-10-CM

## 2013-10-08 DIAGNOSIS — M545 Low back pain, unspecified: Secondary | ICD-10-CM

## 2013-10-08 DIAGNOSIS — J208 Acute bronchitis due to other specified organisms: Secondary | ICD-10-CM

## 2013-10-08 DIAGNOSIS — R634 Abnormal weight loss: Secondary | ICD-10-CM

## 2013-10-08 DIAGNOSIS — A499 Bacterial infection, unspecified: Secondary | ICD-10-CM

## 2013-10-08 DIAGNOSIS — B9689 Other specified bacterial agents as the cause of diseases classified elsewhere: Secondary | ICD-10-CM

## 2013-10-08 DIAGNOSIS — I10 Essential (primary) hypertension: Secondary | ICD-10-CM

## 2013-10-08 DIAGNOSIS — G43009 Migraine without aura, not intractable, without status migrainosus: Secondary | ICD-10-CM

## 2013-10-08 MED ORDER — AZITHROMYCIN 250 MG PO TABS: ORAL_TABLET | ORAL | Status: AC

## 2013-10-08 MED ORDER — BUTALBITAL-ASPIRIN-CAFFEINE 50-325-40 MG PO CAPS: 1.0000 | ORAL_CAPSULE | Freq: Every day | ORAL | Status: DC | PRN

## 2013-10-08 MED ORDER — OXYCODONE-ACETAMINOPHEN 5-325 MG PO TABS: 1.0000 | ORAL_TABLET | Freq: Three times a day (TID) | ORAL | Status: DC | PRN

## 2013-10-08 MED ORDER — LISINOPRIL 20 MG PO TABS: ORAL_TABLET | ORAL | Status: DC

## 2013-10-08 NOTE — Progress Notes (Signed)
CC: Alexa Stephens is a 63 y.o. female is here for anxiety and depression   Subjective: HPI:  Reports continued unintentional weight loss, she estimates 5 pounds since I saw her last. She reports poor appetite but no nausea, abdominal pain, diarrhea nor constipation. I tried to help her arrange to get a colonoscopy since she is due for routine screening however she states that there's no way she could drink enough of the GoLYTELY to be adequately prepped. She denies melena or blood in her stool.  She complains of migraines that occur once a month. Described as unilateral headache, she forgets that side, pulsatile with photophobia but no nausea. There's been no aura and it lasts approximately 24 hours. She tells that she's used multiple trichomoniasis and in the past without benefit. She reports that she's taking Fiorinal in the past with great success. She denies any new motor or sensory disturbances other than that described below  Followup essential hypertension: Continues to take valsartan a daily basis. For the last 3 months she's noticed that some were after 30 minutes and for the following hour after taking this medication she feels lightheaded and dizzy when she's standing or sitting. Symptoms are mild-to-moderate in severity and has not been getting better or worse since this began.  Follow low back pain: Since I saw her last she's only been using oxycodone a few times a week one dose a day for her low back pain that occurs after heavy coughing fits. She denies any character, frequency, nor severity change it usually resolves for a day if she takes a single dose of oxycodone.  Complains of worsening productive cough is slightly improved with albuterol nebulizer or ipratropium nebulizer however it nebulizers have become more ineffective over the past 2 or 3 days, worsening cough has been present for the past week. Accompanied by a sensation of chest congestion and worsening shortness of breath.  Reports subjective fevers but whenever she takes her temperature never true fever. Denies chest pain, confusion, orthopnea nor peripheral edema   Review Of Systems Outlined In HPI  Past Medical History  Diagnosis Date  . Anxiety disorder   . Asthma   . COPD (chronic obstructive pulmonary disease)     Not on home O2 yet but is prescribed.  . Headache(784.0)   . Hyperlipemia   . Hypertension   . Pneumonia Sept 2013  . Sepsis Sept 2013    Past Surgical History  Procedure Laterality Date  . Cosmetic surgery    . Cystectomy    . Hand surgery     Family History  Problem Relation Age of Onset  . Lung cancer Maternal Grandfather   . Cancer Maternal Grandmother     History   Social History  . Marital Status: Single    Spouse Name: N/A    Number of Children: N/A  . Years of Education: N/A   Occupational History  . retired    Social History Main Topics  . Smoking status: Current Every Day Smoker -- 0.02 packs/day for 40 years    Types: Cigarettes  . Smokeless tobacco: Not on file     Comment: 1 pack per 3-4 days 06/11/13  . Alcohol Use: No  . Drug Use: No  . Sexual Activity: Not on file   Other Topics Concern  . Not on file   Social History Narrative  . No narrative on file     Objective: BP 128/78  Pulse 84  Wt 102 lb (46.267 kg)  General: Alert and Oriented, No Acute Distress HEENT: Pupils equal, round, reactive to light. Conjunctivae clear.  External ears unremarkable, canals clear with intact TMs with appropriate landmarks.  Middle ear appears open without effusion. Pink inferior turbinates.  Moist mucous membranes, pharynx without inflammation nor lesions.  Neck supple without palpable lymphadenopathy nor abnormal masses. Lungs: Distant breath sounds with no rails but mild end expiratory wheezing and central rhonchi. No signs of consolidation  Cardiac: Regular rate and rhythm. Normal S1/S2.  No murmurs, rubs, nor gallops.   Extremities: No peripheral edema.   Strong peripheral pulses.  Mental Status: No depression, anxiety, nor agitation. Skin: Warm and dry.  Assessment & Plan: [name] was seen today for anxiety and depression.  Diagnoses and associated orders for this visit:  Low back pain without sciatica, unspecified back pain laterality - oxyCODONE-acetaminophen (ROXICET) 5-325 MG per tablet; Take 1 tablet by mouth every 8 (eight) hours as needed for severe pain.  Migraine without aura and without status migrainosus, not intractable - butalbital-aspirin-caffeine (FIORINAL) 50-325-40 MG per capsule; Take 1 capsule by mouth daily as needed for headache.  Essential hypertension - lisinopril (PRINIVIL,ZESTRIL) 20 MG tablet; One tablet by mouth daily for blood pressure control.  Acute bacterial bronchitis - azithromycin (ZITHROMAX) 250 MG tablet; Take two tabs at once on day 1, then one tab daily on days 2-5.  Unintentional weight loss    Low back pain: Controlled continue as needed Roxicet Migraine: Controlled as needed Fiorinal, discussed rebound phenomena with this medication and to use it sparingly as possible if she becomes needing it more than twice a week we will want to find a different agent, hopefully lisinopril may provide some preventative services for her headaches Essential hypertension: Control but with side effects, she is fearful of the lower dose of valsartan and would like to try a different agent therefore start lisinopril instead Bronchitis: Start azithromycin, and if no better in 2 days call for prednisone Unintentional weight loss: It sounds like a colonoscopy is out of the question since she will not attempt prep, additionally her COPD complicates sedation. She was given stool cards to screen for blood in the stool have asked her to return this at her convenience.  40 minutes spent face-to-face during visit today of which at least 50% was counseling or coordinating care regarding: 1. Low back pain without sciatica,  unspecified back pain laterality   2. Migraine without aura and without status migrainosus, not intractable   3. Essential hypertension   4. Acute bacterial bronchitis   5. Unintentional weight loss      Return in about 3 months (around 01/08/2014) for BP and Breathing.

## 2013-10-10 ENCOUNTER — Telehealth: Payer: Self-pay | Admitting: *Deleted

## 2013-10-10 NOTE — Telephone Encounter (Signed)
Pt states the fiorinal doesn't have codeine in it. She wants this rx to have codeine.

## 2013-10-13 MED ORDER — ZOLMITRIPTAN 2.5 MG PO TABS: ORAL_TABLET | ORAL | Status: DC

## 2013-10-13 NOTE — Telephone Encounter (Signed)
Pt states the fiorinal doesn't work for her

## 2013-10-13 NOTE — Telephone Encounter (Signed)
Rx of zolmitriptan 2.5mg  [facility].

## 2013-10-13 NOTE — Telephone Encounter (Signed)
I would advise against using any product with codeine for the treatment of headaches or migraines since it is known to cause rebound headaches.

## 2013-10-14 MED ORDER — RIZATRIPTAN BENZOATE 5 MG PO TBDP: 5.0000 mg | ORAL_TABLET | ORAL | Status: DC | PRN

## 2013-10-14 NOTE — Telephone Encounter (Signed)
Pt states the new rx sent is $400 out of pocket.  Pt also wanted me to add clonazepam back to her med list and take prozac off of her med list

## 2013-10-14 NOTE — Telephone Encounter (Signed)
Noted, I took off alprazolam as well since she should only be using either clonazepam or alprazolam for anxiety.  Next option for headache control would be as needed rizatriptan [facility]

## 2013-10-15 NOTE — Telephone Encounter (Signed)
Pt aware.

## 2013-10-24 ENCOUNTER — Other Ambulatory Visit: Payer: Self-pay | Admitting: Family Medicine

## 2013-10-24 ENCOUNTER — Other Ambulatory Visit: Payer: Self-pay | Admitting: Family

## 2013-10-28 ENCOUNTER — Telehealth: Payer: Self-pay | Admitting: Pulmonary Disease

## 2013-10-28 NOTE — Telephone Encounter (Signed)
Spoke with Lajoyce Lauber with [facility]-- States that a PA was initiated today by someone but the system is not showing who did it. Ipratropium Nebulizer Solution denied by Medicare Part D Per Lajoyce Lauber, medication will be covered under Medicare Part B Need to contact pharmacy and have them run this medication under Part B. --- [facility] -- [facility] is covered--pt receives this for $7 Not sure what [facility] rep was talking about. [facility] ran Rx again and reassured me that it is indeed covered.  Nothing further needed.

## 2013-10-29 ENCOUNTER — Telehealth: Payer: Self-pay | Admitting: *Deleted

## 2013-10-29 LAB — POC HEMOCCULT BLD/STL (HOME/3-CARD/SCREEN)
Card #2 Fecal Occult Blod, POC: NEGATIVE
Card #3 Fecal Occult Blood, POC: NEGATIVE
Fecal Occult Blood, POC: NEGATIVE

## 2013-10-29 NOTE — Addendum Note (Signed)
Addended by: Terance Hart on: 10/29/2013 01:57 PM   Modules accepted: Orders

## 2013-10-29 NOTE — Telephone Encounter (Signed)
Left message on pt's vm that stool cards were negative for blood

## 2013-11-03 ENCOUNTER — Other Ambulatory Visit: Payer: Self-pay | Admitting: Family Medicine

## 2013-11-11 ENCOUNTER — Telehealth: Payer: Self-pay | Admitting: *Deleted

## 2013-11-11 DIAGNOSIS — I10 Essential (primary) hypertension: Secondary | ICD-10-CM

## 2013-11-11 DIAGNOSIS — G43009 Migraine without aura, not intractable, without status migrainosus: Secondary | ICD-10-CM

## 2013-11-11 MED ORDER — LISINOPRIL 20 MG PO TABS: ORAL_TABLET | ORAL | Status: DC

## 2013-11-11 MED ORDER — BUTALBITAL-ASPIRIN-CAFFEINE 50-325-40 MG PO CAPS: 1.0000 | ORAL_CAPSULE | Freq: Every day | ORAL | Status: DC | PRN

## 2013-11-11 NOTE — Telephone Encounter (Signed)
Pt called earlier and wanted a refill of fiorinal. Pt just left a message on vm that this medication didn't work for her and she wants fiorect.Do you stil not want to rx this medication because of rebound headaches?

## 2013-11-11 NOTE — Telephone Encounter (Signed)
Pt called and would like the dose on her lisinipril to be lowered. She states you and her discussed this at last visit. Pt also requests a refill of migraine medication

## 2013-11-11 NOTE — Telephone Encounter (Signed)
New lisinopril regimen will be half a 20mg  tablet to equal 10mg  daily.  [facility]

## 2013-11-13 ENCOUNTER — Telehealth: Payer: Self-pay | Admitting: *Deleted

## 2013-11-13 MED ORDER — BUTALBITAL-APAP-CAFFEINE 50-325-40 MG PO TABS: 1.0000 | ORAL_TABLET | Freq: Four times a day (QID) | ORAL | Status: DC | PRN

## 2013-11-13 NOTE — Telephone Encounter (Signed)
Andrea, Rx placed in in-box ready for pickup/faxing.  

## 2013-11-13 NOTE — Telephone Encounter (Signed)
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 She said that the fiorenal doesn't help much or like it did 30 years ago. Margette Fast, CMA

## 2013-11-14 ENCOUNTER — Telehealth: Payer: Self-pay | Admitting: Family Medicine

## 2013-11-14 ENCOUNTER — Other Ambulatory Visit: Payer: Self-pay | Admitting: *Deleted

## 2013-11-14 MED ORDER — LISINOPRIL 10 MG PO TABS: 10.0000 mg | ORAL_TABLET | Freq: Every day | ORAL | Status: DC

## 2013-11-14 MED ORDER — TRAMADOL HCL 50 MG PO TABS: 50.0000 mg | ORAL_TABLET | Freq: Three times a day (TID) | ORAL | Status: DC | PRN

## 2013-11-14 MED ORDER — RIZATRIPTAN BENZOATE 5 MG PO TBDP: 5.0000 mg | ORAL_TABLET | ORAL | Status: DC | PRN

## 2013-11-14 NOTE — Telephone Encounter (Signed)
Lisinopril 10mg  sent to pharmacy per patient request. Dr. Ileene Rubens said it was okay to do so. Margette Fast, CMA

## 2013-11-14 NOTE — Telephone Encounter (Signed)
Message left on vm 

## 2013-11-14 NOTE — Telephone Encounter (Signed)
Walk in prior to closing today to pick up her prescription for Fioricet however upon finding that this did not contain codeine she felt that she would not need this medication since it would be predictably ineffective. Alexa Stephens of front conveyed my recommendation codine is well known to contribute to rebound migraine headaches.  She requested something else for an ongoing migraine that has been going on for the past "few days".  She was provided with Ultram to last until she can followup with me.  A nurse visit was not offered at this time due to our office being in the process of closing for the day.

## 2013-11-17 ENCOUNTER — Ambulatory Visit: Payer: Medicare Other | Admitting: Family Medicine

## 2013-11-18 ENCOUNTER — Ambulatory Visit: Payer: Medicare Other | Admitting: Family Medicine

## 2013-11-26 ENCOUNTER — Ambulatory Visit: Payer: Medicare Other | Admitting: Family Medicine

## 2013-12-15 ENCOUNTER — Other Ambulatory Visit: Payer: Self-pay | Admitting: *Deleted

## 2013-12-15 MED ORDER — SIMVASTATIN 20 MG PO TABS: ORAL_TABLET | ORAL | Status: DC

## 2013-12-18 ENCOUNTER — Telehealth: Payer: Self-pay | Admitting: Pulmonary Disease

## 2013-12-18 DIAGNOSIS — J441 Chronic obstructive pulmonary disease with (acute) exacerbation: Secondary | ICD-10-CM

## 2013-12-18 MED ORDER — IPRATROPIUM BROMIDE 0.02 % IN SOLN: 500.0000 ug | Freq: Four times a day (QID) | RESPIRATORY_TRACT | Status: DC | PRN

## 2013-12-18 MED ORDER — ALBUTEROL SULFATE (5 MG/ML) 0.5% IN NEBU: 2.5000 mg | INHALATION_SOLUTION | RESPIRATORY_TRACT | Status: DC

## 2013-12-18 MED ORDER — ALBUTEROL SULFATE HFA 108 (90 BASE) MCG/ACT IN AERS: 2.0000 | INHALATION_SPRAY | RESPIRATORY_TRACT | Status: DC | PRN

## 2013-12-18 NOTE — Telephone Encounter (Signed)
Spoke with pt, states that she cannot refill her albuterol neb since the rx is from 09/2012 even though she still has refills on it.  I advised that since the rx is over a year old she would need a new rx.  Pt states she also needs refills on albuterol neb, ipratropium neb, combivent, and proair.  When I asked why she was using a combivent and proair (which is not on pt's med list) she states she was given it by her pcp and has to use the proair every morning before she uses her combivent.  Also states that her mouth feels dry in the mornings, is weak, sob, and has lost lots of weight (down to 97lbs per pt) and pcp believes this is due to her COPD.  Pt is wondering if VS has any recs before her next appt on 10/29.  Pt was last seen on 06/11/13.    Dr Halford Chessman please advise on how to advise pt to use her inhalers and if you have any recs for pt before her rov with you.  Thank you.

## 2013-12-18 NOTE — Telephone Encounter (Signed)
Pt states the combivent is more soothing to her but the proair helps her breathe better faster, so she wants me to send this in.  Pt is upset that she cannot use both her combivent and her proair.  Medications refilled.  Pt stated that she wished she could come in sooner, I offered her an appt with TP as VS isn't in the office until next Thursday when she is already scheduled to come in.  She declined appt with TP.  I advised pt to contact us if her breathing worsens between then and now.  Nothing further needed at this time.

## 2013-12-18 NOTE — Telephone Encounter (Signed)
Okay to send refill for albuterol and ipratropium nebulizer medicine.  Please ask her whether she feels combivent or proair works better for her >> then send refill for medicine that works better, but she does not need to use both.

## 2013-12-19 ENCOUNTER — Other Ambulatory Visit: Payer: Self-pay | Admitting: Pulmonary Disease

## 2013-12-19 ENCOUNTER — Other Ambulatory Visit: Payer: Self-pay | Admitting: Family

## 2013-12-22 ENCOUNTER — Telehealth: Payer: Self-pay | Admitting: Pulmonary Disease

## 2013-12-22 ENCOUNTER — Other Ambulatory Visit: Payer: Self-pay | Admitting: Family Medicine

## 2013-12-22 DIAGNOSIS — J449 Chronic obstructive pulmonary disease, unspecified: Secondary | ICD-10-CM

## 2013-12-22 DIAGNOSIS — J441 Chronic obstructive pulmonary disease with (acute) exacerbation: Secondary | ICD-10-CM

## 2013-12-22 MED ORDER — ALBUTEROL SULFATE (2.5 MG/3ML) 0.083% IN NEBU: 2.5000 mg | INHALATION_SOLUTION | Freq: Four times a day (QID) | RESPIRATORY_TRACT | Status: DC | PRN

## 2013-12-22 MED ORDER — ALBUTEROL SULFATE HFA 108 (90 BASE) MCG/ACT IN AERS: 2.0000 | INHALATION_SPRAY | RESPIRATORY_TRACT | Status: DC | PRN

## 2013-12-22 NOTE — Telephone Encounter (Signed)
Pt called to request refill of albuterol solution which cannot be reflled without prior authorization and an ICD10 code. I have tried make these refills happen though I am not totally familiar with the e-prescribe system. Will need f/u in AM 10/27 to ensure that she has received her medications  Merton Border, MD ; [facility] [phone].  After 5:30 PM or weekends, call [phone]

## 2013-12-23 ENCOUNTER — Telehealth: Payer: Self-pay | Admitting: Pulmonary Disease

## 2013-12-23 ENCOUNTER — Other Ambulatory Visit: Payer: Self-pay | Admitting: Pulmonary Disease

## 2013-12-23 ENCOUNTER — Other Ambulatory Visit: Payer: Self-pay

## 2013-12-23 DIAGNOSIS — J441 Chronic obstructive pulmonary disease with (acute) exacerbation: Secondary | ICD-10-CM

## 2013-12-23 MED ORDER — ALBUTEROL SULFATE (2.5 MG/3ML) 0.083% IN NEBU: 2.5000 mg | INHALATION_SOLUTION | Freq: Four times a day (QID) | RESPIRATORY_TRACT | Status: DC | PRN

## 2013-12-23 MED ORDER — CLONAZEPAM 1 MG PO TABS: 1.0000 mg | ORAL_TABLET | Freq: Two times a day (BID) | ORAL | Status: DC | PRN

## 2013-12-23 MED ORDER — IPRATROPIUM BROMIDE 0.02 % IN SOLN: 500.0000 ug | Freq: Four times a day (QID) | RESPIRATORY_TRACT | Status: DC | PRN

## 2013-12-23 MED ORDER — IPRATROPIUM BROMIDE 0.02 % IN SOLN: 500.0000 ug | Freq: Four times a day (QID) | RESPIRATORY_TRACT | Status: AC | PRN

## 2013-12-23 NOTE — Addendum Note (Signed)
Addended by: Virl Cagey on: 12/23/2013 11:57 AM   Modules accepted: Orders

## 2013-12-23 NOTE — Telephone Encounter (Signed)
Albuterol and Ipratropium resent-- Rx's kept printing.  [facility] Nothing further needed.

## 2013-12-23 NOTE — Telephone Encounter (Signed)
LMTCB for the pt 

## 2013-12-23 NOTE — Telephone Encounter (Signed)
Phone # for ins co to precert - [phone]. Pt's phone # [phone].

## 2013-12-23 NOTE — Telephone Encounter (Signed)
Will route to my nurse to ensure that prescription has been filled by pt.

## 2013-12-23 NOTE — Telephone Encounter (Addendum)
Called pt-- LM to return call-- to let patient know pharmacy is working on rx's  Spoke with pharmacy--states that all they need are for the rx's to be refaxed with ICD10: J44.9 code on Rx. Albuterol and Ipratropium Will call back if anything further needed.

## 2013-12-24 NOTE — Telephone Encounter (Signed)
lmomtcb for pt 

## 2013-12-25 ENCOUNTER — Encounter: Payer: Self-pay | Admitting: Pulmonary Disease

## 2013-12-25 ENCOUNTER — Ambulatory Visit (INDEPENDENT_AMBULATORY_CARE_PROVIDER_SITE_OTHER)
Admission: RE | Admit: 2013-12-25 | Discharge: 2013-12-25 | Disposition: A | Discharge status: Home / Self Care | Payer: Medicare Other | Source: Ambulatory Visit | Attending: Pulmonary Disease | Admitting: Pulmonary Disease

## 2013-12-25 ENCOUNTER — Ambulatory Visit (INDEPENDENT_AMBULATORY_CARE_PROVIDER_SITE_OTHER): Payer: Medicare Other | Admitting: Pulmonary Disease

## 2013-12-25 ENCOUNTER — Other Ambulatory Visit: Payer: Self-pay | Admitting: Pulmonary Disease

## 2013-12-25 VITALS — BP 162/88 | HR 91 | Temp 99.0°F | Ht 62.0 in | Wt 101.0 lb

## 2013-12-25 DIAGNOSIS — Z720 Tobacco use: Secondary | ICD-10-CM

## 2013-12-25 DIAGNOSIS — R634 Abnormal weight loss: Secondary | ICD-10-CM

## 2013-12-25 DIAGNOSIS — J439 Emphysema, unspecified: Secondary | ICD-10-CM

## 2013-12-25 DIAGNOSIS — J9611 Chronic respiratory failure with hypoxia: Secondary | ICD-10-CM

## 2013-12-25 MED ORDER — PREDNISONE 5 MG PO TABS: ORAL_TABLET | ORAL | Status: DC

## 2013-12-25 MED ORDER — DOXYCYCLINE HYCLATE 100 MG PO TABS: 100.0000 mg | ORAL_TABLET | Freq: Two times a day (BID) | ORAL | Status: DC

## 2013-12-25 NOTE — Assessment & Plan Note (Signed)
Discussed importance of smoking abstinence.

## 2013-12-25 NOTE — Patient Instructions (Signed)
Chest xray today Doxycycline 100 mg twice per day for 7 days Prednisone 5 mg pill >> 4 pills daily for 2 days, 3 pills daily for 2 days, 2 pills daily for 2 days, then 1 pill daily until next visit Follow up in 6 weeks

## 2013-12-25 NOTE — Assessment & Plan Note (Signed)
Will check CXR to determine if she has lung disorder that could be contributing >> depending on results she might need CT chest to further assess.  Discussed options to maintain her nutritional intact.  She might need evaluation by dietician.  Prednisone use will likely improve her appetite, and maintain weight better.  This could all be related to progression of her COPD/emphysema.

## 2013-12-25 NOTE — Telephone Encounter (Signed)
Attempted to call the pt but it states that the [employer] customer is unavailable.  Will try back later.

## 2013-12-25 NOTE — Telephone Encounter (Addendum)
Pt in office today for OV with Dr Halford Chessman. Verified the above.  States that she is almost out of her nebulizer medications and needs this handled ASAP.  I advised the patient that we have been trying to reach her and have not been able to reach her. Pt aware that the pharmacy is working on getting her nebulizer meds approved through insurance.  Ipratropium-ALbuterol Neb needs PA

## 2013-12-25 NOTE — Assessment & Plan Note (Addendum)
Will give her course of doxycycline and prendisone >> plan to keep her on 5 mg prednisone until next ROV.  Will continue combivent/duoneb.

## 2013-12-25 NOTE — Assessment & Plan Note (Signed)
She is to continue 2 liters oxygen 24/7. 

## 2013-12-25 NOTE — Progress Notes (Signed)
Chief Complaint  Patient presents with  . Follow-up    Pt c/o increased SOB, chest tightness, and dry mouth in AM's. C/o weakness, fatigue, swimmy feeling in head and weight loss.  Pt states that she has been experiencing HAs in mornings.     CC: Alexa Stephens  History of Present Illness: Alexa Stephens is a 63 y.o. female smoker with severe COPD, hypoxia, and snoring.  She has not been feeling well since her last visit.  She is feeling weaker, and has been losing wt >> she has lost 20 lbs since April 2015.  She is getting cough with clear to yellow sputum, and wheeze.  She feels her heart races when she does any activity >> recovers after few minutes of rest.  She does not feel like she can do any activities.  She denies fever or hemoptysis.  She has not had leg swelling.  She gets sinus congestion and feels dizzy sometimes.  She is using albuterol several times per day.  She is using 2 liters oxygen.  Tests: Spirometry 11/01/11>>FEV1 0.48 (21%), FEV1% 37 (difficult pt effort). 11/01/11 SpO2 85% on room air at rest A1AT 06/21/12 >> 140, PI-MS   Physical Exam:  General - wearing oxygen, cachectic ENT - No sinus tenderness, no oral exudate, no LAN Cardiac - s1s2 regular, no murmur Chest - prolonged exhalation, decreased breath sounds, bronchial breaths sounds upper lobes b/l Back - no focal tenderness Abd - soft, non-tender Ext - no edema, deformity right hand Neuro - normal strength Skin - no rashes Psych - normal mood, and behavior    Assessment/Plan:  Chesley Mires, MD [facility]Critical [facility]/Sleep Pager:  [phone] 12/25/2013, 4:58 PM

## 2013-12-26 NOTE — Telephone Encounter (Signed)
Spoke with the pt's pharmacy  Was advised by Autum, pharmacist that this has been taking care of  It was not a PA, just needed to be filed under medicare part B  Pt picked up her med last night  Nothing further needed

## 2013-12-26 NOTE — Telephone Encounter (Signed)
Nothing further needed. Pt aware that the pharmacy is working on getting her nebulizer meds approved through insurance.

## 2013-12-29 ENCOUNTER — Telehealth: Payer: Self-pay | Admitting: Pulmonary Disease

## 2013-12-29 NOTE — Telephone Encounter (Signed)
Dg Chest 2 View  12/26/2013   CLINICAL DATA:  One month history of worsening shortness of breath and cough; hypertension  EXAM: CHEST  2 VIEW  COMPARISON:  June 11, 2013  FINDINGS: There is underlying emphysematous change. There is mild perihilar interstitial edema. There is no airspace consolidation. Nipple shadows are noted bilaterally. The heart is normal in size. The appearance of the pulmonary vascularity suggests that there is pulmonary vascular congestion superimposed on emphysematous change. No adenopathy. There is degenerative change in the thoracic spine. There is mid and lower thoracic levoscoliosis.  IMPRESSION: Findings felt to represent a degree of congestive heart failure superimposed on emphysematous change. No consolidation.   Electronically Signed   By: Lowella Grip M.D.   On: 12/26/2013 08:35   Left message on pt's voicemail detailing results.  No change to current tx plan.  She can call back with questions.

## 2014-01-02 ENCOUNTER — Telehealth: Payer: Self-pay | Admitting: Pulmonary Disease

## 2014-01-02 NOTE — Telephone Encounter (Signed)
Reviewed CXR results with pt.

## 2014-01-02 NOTE — Telephone Encounter (Signed)
Called and spoke to pt. Pt stated she would like to speak with VS about CXR results. Pt stated she is best reached at [phone] after 10 am.   Will forward to VS to make aware.

## 2014-01-07 ENCOUNTER — Other Ambulatory Visit: Payer: Self-pay | Admitting: Family Medicine

## 2014-01-16 ENCOUNTER — Telehealth: Payer: Self-pay | Admitting: *Deleted

## 2014-01-16 ENCOUNTER — Other Ambulatory Visit: Payer: Self-pay | Admitting: Family Medicine

## 2014-01-16 NOTE — Telephone Encounter (Signed)
I agree with all your advice Alexa Stephens, I do not prescribe narcotics for migraines since they are known to cause rebound migraines that are typically more painful.

## 2014-01-16 NOTE — Telephone Encounter (Signed)
Maryanne called stating that she has had a migraine for approx 2 weeks intermittently and it will not improve or go away.  She was asking for pain medication stronger than tramadol and was pretty persistent. I told her that if she had exhausted all alternatives that maybe she needed to come in and have an appt to be seen or go to UC. She then said that she was unable to get a ride here to [place] but would go to the ER in [facility] but couldn't understand why I wouldn't send in pain meds for her and they better not charge her $500 for pain pills at the ER. I tried explaining to her in great detail or the best I could that she probably needed other intervention for her headache and a narcotic was not going to help her and she got frustrated and refused to schedule appt. Please advise. Margette Fast, CMA

## 2014-02-03 ENCOUNTER — Ambulatory Visit (INDEPENDENT_AMBULATORY_CARE_PROVIDER_SITE_OTHER): Payer: Medicare Other | Admitting: Family Medicine

## 2014-02-03 ENCOUNTER — Encounter: Payer: Self-pay | Admitting: Family Medicine

## 2014-02-03 VITALS — BP 179/93 | HR 94 | Wt 95.0 lb

## 2014-02-03 DIAGNOSIS — B9689 Other specified bacterial agents as the cause of diseases classified elsewhere: Secondary | ICD-10-CM

## 2014-02-03 DIAGNOSIS — J329 Chronic sinusitis, unspecified: Secondary | ICD-10-CM

## 2014-02-03 DIAGNOSIS — R631 Polydipsia: Secondary | ICD-10-CM

## 2014-02-03 DIAGNOSIS — A499 Bacterial infection, unspecified: Secondary | ICD-10-CM

## 2014-02-03 DIAGNOSIS — G43819 Other migraine, intractable, without status migrainosus: Secondary | ICD-10-CM

## 2014-02-03 DIAGNOSIS — I10 Essential (primary) hypertension: Secondary | ICD-10-CM

## 2014-02-03 MED ORDER — LISINOPRIL 20 MG PO TABS: 20.0000 mg | ORAL_TABLET | Freq: Every day | ORAL | Status: DC

## 2014-02-03 MED ORDER — AMOXICILLIN-POT CLAVULANATE 500-125 MG PO TABS: ORAL_TABLET | ORAL | Status: AC

## 2014-02-03 MED ORDER — HYDROCODONE-ACETAMINOPHEN 10-325 MG PO TABS: 0.5000 | ORAL_TABLET | Freq: Three times a day (TID) | ORAL | Status: DC | PRN

## 2014-02-03 NOTE — Progress Notes (Signed)
CC: Alexa Stephens is a 63 y.o. female is here for migraine and neck pain   Subjective: HPI:  Complains of a right-sided headache further localized to the top of the head that isa copy by nausea and photophobia with a pounding quality that has been present for years on. She is worried because for the past 3 months it's been present on a daily basis varying from mild to severe in severity. It also has been covered by scotomas in her peripheral vision with a normal ophthalmology exam about a year ago per her report. Symptoms are slightly improved with hydrocodone and used to Respond to Fioricet with codeine but this lost its effectiveness months 2 years ago. She tells me she has tried Imitrex, Zomig, Relpax, Maxalt and 2 other triptan's for headaches. none of which have ever provided her any benefit. Since startinglisinoprilat her last visit she does not have any improvement with her headache either. She denies any other accompanying motor sensory disturbances prior during or after the headaches.  Complains of thick nasal congestion and postnasal drip with pressure in the nose and right forehead that has been present for the past week worsening on a daily basis. Present all hours of the day but seems to be worse first thing in the morning and in the evening. No benefit from over-the-counter cough and cold medication. No other interventions as of yet.  Follow-up essential hypertension: No outside blood pressures to report. She denies any new cough, angioedema, chest pain, peripheral edema nor orthopnea  She complains of constant thirst that has been present for matter of weeks on a daily basis. She states that she also has some polyuria and frequency. But no other genitourinary complaints such as dysuria or hesitancy.    Review Of Systems Outlined In HPI  Past Medical History  Diagnosis Date  . Anxiety disorder   . Asthma   . COPD (chronic obstructive pulmonary disease)     Not on home O2 yet but is  prescribed.  . Headache(784.0)   . Hyperlipemia   . Hypertension   . Pneumonia Sept 2013  . Sepsis Sept 2013    Past Surgical History  Procedure Laterality Date  . Cosmetic surgery    . Cystectomy    . Hand surgery     Family History  Problem Relation Age of Onset  . Lung cancer Maternal Grandfather   . Cancer Maternal Grandmother     History   Social History  . Marital Status: Single    Spouse Name: N/A    Number of Children: N/A  . Years of Education: N/A   Occupational History  . retired    Social History Main Topics  . Smoking status: Current Every Day Smoker -- 0.02 packs/day for 40 years    Types: Cigarettes  . Smokeless tobacco: Not on file     Comment: 1 pack per 3-4 days 06/11/13  . Alcohol Use: No  . Drug Use: No  . Sexual Activity: Not on file   Other Topics Concern  . Not on file   Social History Narrative     Objective: BP 179/93 mmHg  Pulse 94  Wt 95 lb (43.092 kg)  SpO2 94%  General: Alert and Oriented, No Acute Distress HEENT: Pupils equal, round, reactive to light. Conjunctivae clear.  External ears unremarkable, canals clear with intact TMs with appropriate landmarks.  Middle ear appears open without effusion. Boggy erythematous inferior turbinates.  Moist mucous membranes, pharynx without inflammation nor lesions  however moderate postnasal drip and cobblestoning.  Neck supple without palpable lymphadenopathy nor abnormal masses. Lungs: distant breath sounds in all lung fields Cardiac: Regular rate and rhythm.  Neuro: CN II-XII grossly intact, full strength/rom of all four extremities,  gait normal, rapid alternating movements normal,  Extremities: No peripheral edema.  Strong peripheral pulses.  Mental Status: No depression, anxiety, nor agitation. Skin: Warm and dry.  Assessment & Plan: Alexa Stephens was seen today for migraine and neck pain.  Diagnoses and associated orders for this visit:  Other migraine without status migrainosus,  intractable - HYDROcodone-acetaminophen (NORCO) 10-325 MG per tablet; Take 0.5-1 tablets by mouth every 8 (eight) hours as needed (headache).  Bacterial sinusitis - amoxicillin-clavulanate (AUGMENTIN) 500-125 MG per tablet; Take one by mouth every 8 hours for ten total days.  Essential hypertension - lisinopril (PRINIVIL,ZESTRIL) 20 MG tablet; Take 1 tablet (20 mg total) by mouth daily.  Polydipsia - COMPLETE METABOLIC PANEL WITH GFR    Essential hypertension: Uncontrolled chronic condition increasing lisinopril, hopefully this might help reduce some of her migraines as well Migraines: Her headaches sound like migraines. I discussed with her that taking medications like codeine or hydrocodone to manage her headaches put her at high risk for rebound headaches. She's failed a variety of abortive medications including triptians, tramadol and nonsteroidal anti-inflammatories. I have extremely low suspicion that she would ever misused any hydrocodone and have offered this to her as long as she understands that this could cause rebound headaches/migraines and if I see this occur i will start taper down.. Polydipsia: Discussed my suspicion that this is a side effect of her prednisone now that she is taking on a daily basis, she is uncertain as to when her symptoms began with respect to when she began daily prednisone. Labs above to rule out hyperglycemia or electro light abnormality  40 minutes spent face-to-face during visit today of which at least 50% was counseling or coordinating care regarding: 1. Other migraine without status migrainosus, intractable   2. Bacterial sinusitis   3. Essential hypertension   4. Polydipsia      Return in about 4 weeks (around 03/03/2014) for BP follow up.

## 2014-02-04 LAB — COMPLETE METABOLIC PANEL WITH GFR
ALBUMIN: 4.6 g/dL (ref 3.5–5.2)
ALT: 19 U/L (ref 0–35)
AST: 24 U/L (ref 0–37)
Alkaline Phosphatase: 79 U/L (ref 39–117)
BUN: 7 mg/dL (ref 6–23)
CALCIUM: 9.6 mg/dL (ref 8.4–10.5)
CO2: 36 mEq/L — ABNORMAL HIGH (ref 19–32)
Chloride: 94 mEq/L — ABNORMAL LOW (ref 96–112)
Creat: 0.41 mg/dL — ABNORMAL LOW (ref 0.50–1.10)
GFR, Est African American: >89 mL/min
GFR, Est Non African American: >89 mL/min
GLUCOSE: 104 mg/dL — AB (ref 70–99)
POTASSIUM: 4.2 meq/L (ref 3.5–5.3)
Sodium: 142 mEq/L (ref 135–145)
Total Bilirubin: 0.3 mg/dL (ref 0.2–1.2)
Total Protein: 7.3 g/dL (ref 6.0–8.3)

## 2014-02-08 ENCOUNTER — Other Ambulatory Visit: Payer: Self-pay | Admitting: Family Medicine

## 2014-02-11 ENCOUNTER — Other Ambulatory Visit: Payer: Self-pay | Admitting: Family Medicine

## 2014-02-12 ENCOUNTER — Telehealth: Payer: Self-pay | Admitting: Pulmonary Disease

## 2014-02-12 ENCOUNTER — Ambulatory Visit (INDEPENDENT_AMBULATORY_CARE_PROVIDER_SITE_OTHER): Payer: Medicare Other | Admitting: Pulmonary Disease

## 2014-02-12 ENCOUNTER — Encounter: Payer: Self-pay | Admitting: Pulmonary Disease

## 2014-02-12 ENCOUNTER — Ambulatory Visit: Payer: Medicare Other | Admitting: Pulmonary Disease

## 2014-02-12 VITALS — BP 118/80 | HR 90 | Ht 62.0 in | Wt 95.6 lb

## 2014-02-12 DIAGNOSIS — J432 Centrilobular emphysema: Secondary | ICD-10-CM

## 2014-02-12 DIAGNOSIS — Z720 Tobacco use: Secondary | ICD-10-CM

## 2014-02-12 DIAGNOSIS — J9611 Chronic respiratory failure with hypoxia: Secondary | ICD-10-CM

## 2014-02-12 MED ORDER — FLUTICASONE FUROATE-VILANTEROL 100-25 MCG/INH IN AEPB: 1.0000 | INHALATION_SPRAY | Freq: Every day | RESPIRATORY_TRACT | Status: DC

## 2014-02-12 MED ORDER — UMECLIDINIUM BROMIDE 62.5 MCG/INH IN AEPB: 1.0000 | INHALATION_SPRAY | Freq: Every day | RESPIRATORY_TRACT | Status: DC

## 2014-02-12 NOTE — Telephone Encounter (Signed)
lmomtcb x1 

## 2014-02-12 NOTE — Patient Instructions (Addendum)
Breo one puff daily >> rinse mouth after each use Incruse one puff daily  Follow up in 8 weeks

## 2014-02-12 NOTE — Telephone Encounter (Signed)
Called [facility]. Was told the breo went through for $6.60 and it does not need PA The incruse does need PA. This is being faxed to triage. Will await fax

## 2014-02-12 NOTE — Progress Notes (Signed)
Chief Complaint  Patient presents with  . Follow-up    Pt reports no improvement since last OV. Pt states that she has been having chest tightness "deep"    CC: Clent Jacks  History of Present Illness: Alexa Stephens is a 62 y.o. female smoker with severe COPD, hypoxia, and snoring.  She was started on augmentin by PCP for sinus infection.  She has several more days of this.  She remains on 5 mg prednisone daily.    She feels combivent helps, but doesn't last.  She is willing to try other inhalers >> she thinks her insurance should be able to cover these medication changes.  She is still getting cough, wheeze, sputum, chest congestion.  She gets dizzy at times, and shaky. She can't keep her weight up.  She is using 2 liters oxygen.  Tests: Spirometry 11/01/11>>FEV1 0.48 (21%), FEV1% 37 (difficult pt effort). 11/01/11 SpO2 85% on room air at rest A1AT 06/21/12 >> 140, PI-MS  PMHx >> Anxiety, HLD, HTN, Headaches  PSHx, Medications, Allergies, Fhx, Shx reviewed.  Physical Exam: Blood pressure 118/80, pulse 90, height 5\' 2"  (1.575 m), weight 95 lb 9.6 oz (43.364 kg), SpO2 99 %. Body mass index is 17.48 kg/(m^2).  General - wearing oxygen, cachectic ENT - No sinus tenderness, no oral exudate, no LAN Cardiac - s1s2 regular, no murmur Chest - prolonged exhalation, decreased breath sounds, bronchial breaths sounds upper lobes b/l Back - no focal tenderness Abd - soft, non-tender Ext - no edema, deformity right hand Neuro - normal strength Skin - no rashes Psych - normal mood, and behavior    Assessment/Plan:  GOLD 4 COPD with emphysema. Plan: - add breo and incruse - prn albuterol - continue prednisone 5 mg daily for now - ?if she should be assessed for hospice care >> will need to discuss more over next several visits as we go ahead  Chronic hypoxic respiratory failure. 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 Plan: -  advised her to f/u with PCP  Acute sinusitis. Plan: - she is to finish Abx from her PCP  Chesley Mires, MD [place] [facility]/Sleep Pager:  [phone] 02/12/2014, 4:40 PM

## 2014-02-13 ENCOUNTER — Telehealth: Payer: Self-pay | Admitting: Pulmonary Disease

## 2014-02-13 ENCOUNTER — Other Ambulatory Visit: Payer: Self-pay | Admitting: Family Medicine

## 2014-02-13 NOTE — Telephone Encounter (Signed)
Alexa Stephens has PA.

## 2014-02-13 NOTE — Telephone Encounter (Signed)
Duplicate msg.

## 2014-02-13 NOTE — Telephone Encounter (Signed)
[facility] and spoke with Autum She will refax PA form  Will await fax

## 2014-02-13 NOTE — Telephone Encounter (Signed)
Spoke with [employer] [phone].  Formulary Exception initiated for Incruse Ellipta 62.84mcg.  Patient ID # [id number]  Approved through 02/27/2015 Case # [id number]  Total Time: 39mins ----- Pt aware via detailed message on machine that Incruse is approved by insurance and can be filled today. Nothing further needed.

## 2014-02-16 NOTE — Telephone Encounter (Signed)
Attempted to call the pt but had to lmomtcb x 1.

## 2014-02-17 NOTE — Telephone Encounter (Signed)
Attempted to call the pt again but it states that the [employer] customer is unavailable. Will try back later.

## 2014-02-18 ENCOUNTER — Encounter: Payer: Self-pay | Admitting: *Deleted

## 2014-02-18 NOTE — Telephone Encounter (Signed)
Letter signed by Dr Gwenette Greet in Dr Juanetta Gosling absence. Letter faxed back to [facility] [phone] Called patient to see if there is a number for [employer] that I can contact her to ensure this letter is received.  Called pt at [phone](number given d/t power being off), NA and unable to leave voicemail. WCB -------- Called and spoke with Shirlee Limerick with [employer] [place] [phone]  Per Shirlee Limerick, the patient account is current and is showing that it is functioning properly. As of 1:48pm 02/18/14 there is no indication of power disruption. States that the only notes seen on pt account is that she wanted to have a letter of life support placed on her account  Verified fax and put letter to [place] attention in addition to [employer]. Shirlee Limerick aware and will keep an eye out for this letter of "life support."  (fax) [phone]-Grace  (fax) [phone] -------- Spoke with patient, aware that letter faxed. States that power is back on. Nothing further needed.

## 2014-02-18 NOTE — Telephone Encounter (Signed)
Please advise Dr Gwenette Greet if you will sign this letter in Dr Juanetta Gosling absence. Thanks.   "To whom it may concern, I follow Alexa Stephens for COPD and chronic respiratory failure.  Because of her severe oxygen dependent lung disease, she requires life sustaining, home oxygen.  She uses this continuously; 24 hours per day.  In the event of a power outage, it is strongly recommended for Alexa Stephens's home to be a priority.     If you have any questions or concerns, please don't hesitate to call."

## 2014-02-18 NOTE — Telephone Encounter (Signed)
Pt called stating that her lights are off and see if another MD here can sign the letter today and get it to the power company. She is currently on O2 and needs this letter ASAP.   [phone] call this number as she has no power.

## 2014-02-18 NOTE — Telephone Encounter (Signed)
Spoke with pt.  She has a "utilities bundle" with the [employer] for her electric, h2o, and sewer.  Pt is requesting a letter be faxed to [employer] with [facility] at [phone] stating she is on life sustaining o2 so her home will be a priority in the presence of a power outage.  Advised letter will be composed but Dr. Halford Chessman would not be in office until next week to review and sign.  Pt ok with this.  Letter composed and placed in Dr. Juanetta Gosling look at to review and sign.

## 2014-02-23 ENCOUNTER — Other Ambulatory Visit: Payer: Self-pay | Admitting: Family Medicine

## 2014-03-10 ENCOUNTER — Telehealth: Payer: Self-pay | Admitting: Pulmonary Disease

## 2014-03-10 NOTE — Telephone Encounter (Signed)
Called and spoke with pt and she is aware to take one inhaler in the morning and the other in the evening and to rinse her mouth after each use.    appt has been scheduled for the pt to come in tomorrow to see VS since she is wheezing more and is not getting better.  Pt is aware of appt and nothing further is needed,

## 2014-03-11 ENCOUNTER — Ambulatory Visit: Payer: Medicare Other | Admitting: Pulmonary Disease

## 2014-03-12 ENCOUNTER — Other Ambulatory Visit: Payer: Self-pay | Admitting: Pulmonary Disease

## 2014-03-13 ENCOUNTER — Ambulatory Visit: Payer: Medicare Other | Admitting: Pulmonary Disease

## 2014-03-16 ENCOUNTER — Other Ambulatory Visit: Payer: Self-pay | Admitting: Pulmonary Disease

## 2014-03-19 ENCOUNTER — Telehealth: Payer: Self-pay | Admitting: Pulmonary Disease

## 2014-03-19 DIAGNOSIS — J9611 Chronic respiratory failure with hypoxia: Secondary | ICD-10-CM

## 2014-03-19 NOTE — Telephone Encounter (Signed)
Okay to send order. 

## 2014-03-19 NOTE — Telephone Encounter (Signed)
Combivent RX was sent in 03/18/14.  Pt is aware. She is also asking for an order to be sent to get her portable O2. She uses 2-2.5 liters. Pt reports [facility] told her we rx'd for 4 liters. Please advise if okay to send order. Please advise Dr. Halford Chessman thanks

## 2014-03-19 NOTE — Telephone Encounter (Signed)
Order placed. Nothing further needed. 

## 2014-03-26 ENCOUNTER — Ambulatory Visit (INDEPENDENT_AMBULATORY_CARE_PROVIDER_SITE_OTHER): Payer: Medicare Other | Admitting: Family Medicine

## 2014-03-26 ENCOUNTER — Ambulatory Visit (HOSPITAL_BASED_OUTPATIENT_CLINIC_OR_DEPARTMENT_OTHER)
Admission: RE | Admit: 2014-03-26 | Discharge: 2014-03-26 | Disposition: A | Discharge status: Home / Self Care | Payer: Medicare Other | Source: Ambulatory Visit | Attending: Family Medicine | Admitting: Family Medicine

## 2014-03-26 ENCOUNTER — Encounter: Payer: Self-pay | Admitting: Family Medicine

## 2014-03-26 ENCOUNTER — Ambulatory Visit: Payer: Medicare Other | Admitting: Family Medicine

## 2014-03-26 ENCOUNTER — Ambulatory Visit (INDEPENDENT_AMBULATORY_CARE_PROVIDER_SITE_OTHER): Payer: Medicare Other

## 2014-03-26 ENCOUNTER — Telehealth: Payer: Self-pay | Admitting: Family Medicine

## 2014-03-26 VITALS — BP 144/88 | HR 106 | Wt 91.0 lb

## 2014-03-26 DIAGNOSIS — R6881 Early satiety: Secondary | ICD-10-CM | POA: Diagnosis not present

## 2014-03-26 DIAGNOSIS — M25552 Pain in left hip: Secondary | ICD-10-CM

## 2014-03-26 DIAGNOSIS — R1901 Right upper quadrant abdominal swelling, mass and lump: Secondary | ICD-10-CM | POA: Diagnosis present

## 2014-03-26 DIAGNOSIS — K769 Liver disease, unspecified: Secondary | ICD-10-CM | POA: Insufficient documentation

## 2014-03-26 MED ORDER — CYCLOBENZAPRINE HCL 10 MG PO TABS: 10.0000 mg | ORAL_TABLET | Freq: Three times a day (TID) | ORAL | Status: DC | PRN

## 2014-03-26 NOTE — Telephone Encounter (Signed)
Pt.notified

## 2014-03-26 NOTE — Progress Notes (Signed)
CC: Alexa Stephens is a 64 y.o. female is here for leg/groin pain   Subjective: HPI:  Complains of left groin pain that has been present for the last 5 days. It's present on a daily basis and worse with any flexion extension internal rotation or external rotation of the left femur.  Localized primarily in the anterior groin and radiates into the buttock. Symptoms are moderate in severity at their worst. Mild in severity at rest. No interventions other than ibuprofen which was of no benefit. Symptoms have been persistent ever since she stumbled into the side of the bed. She denies joint pain elsewhere. Denies any genitourinary or gastrointestinal complaints other than that described below.  Complains of abdominal pain and swelling localized in the epigastric and right upper quadrant region. She came across this while rubbing her abdomen a few weeks ago. Pain is mild in severity to the touch absent all other times. Pain is nonradiating and described only as pain. She reports over the past at least month feeling a fullness in her stomach even if she hadn't eaten in hours, she reports early satiety and his sensation improves if she lies down on her back. She's had continued unintentional weight loss since I met her many months ago. No nausea vomiting or constipation. Denies fevers, chills.    Review Of Systems Outlined In HPI  Past Medical History  Diagnosis Date  . Anxiety disorder   . Asthma   . COPD (chronic obstructive pulmonary disease)     Not on home O2 yet but is prescribed.  . Headache(784.0)   . Hyperlipemia   . Hypertension   . Pneumonia Sept 2013  . Sepsis Sept 2013    Past Surgical History  Procedure Laterality Date  . Cosmetic surgery    . Cystectomy    . Hand surgery     Family History  Problem Relation Age of Onset  . Lung cancer Maternal Grandfather   . Cancer Maternal Grandmother     History   Social History  . Marital Status: Single    Spouse Name: N/A    Number  of Children: N/A  . Years of Education: N/A   Occupational History  . retired    Social History Main Topics  . Smoking status: Current Every Day Smoker -- 0.02 packs/day for 40 years    Types: Cigarettes  . Smokeless tobacco: Not on file     Comment: 1 pack per 3-4 days 06/11/13  . Alcohol Use: No  . Drug Use: No  . Sexual Activity: Not on file   Other Topics Concern  . Not on file   Social History Narrative     Objective: BP 144/88 mmHg  Pulse 106  Wt 91 lb (41.277 kg)  SpO2 92%  General: Alert and Oriented, No Acute Distress HEENT: Pupils equal, round, reactive to light. Conjunctivae clear.  Moist mucous membranes Lungs: Comfortable work of breathing, distant breath sounds Cardiac: Regular rate and rhythm.  Abdomen: Normal bowel sounds, tender firm palpable mass in the right upper quadrant and epigastric region about the size of a golf ball. It is pulsatile. No pain or masses palpated elsewhere Extremities: No peripheral edema.  Strong peripheral pulses.  Left leg exam: Full range of motion strengthen the hip area no pain with stationary weightbearing. Logroll and internal/external rotation of the femur with force directed into the acetabulum reproduce her pain.SLR negative, FABER positive, FADIR negative.  No pain with resisted abduction or adduction of the femurs  with hip flexed to 45 while lying down. Mental Status: No depression, anxiety, nor agitation. Skin: Warm and dry.  Assessment & Plan: [name] was seen today for leg/groin pain.  Diagnoses and associated orders for this visit:  Abdominal mass, RUQ (right upper quadrant) - US Abdomen Complete; Future  Left hip pain - DG FEMUR MIN 2 VIEWS LEFT; Future    Abdominal mass: I used a bedside ultrasound to determine whether or not the mass was the aorta or some other vascular structure, using color Doppler the aorta and inferior vena cava were easily identified and approximately 2 or 3 cm below this mass. I feel  comfortable concluding that the mass is not an aortic aneurysm or vascular abnormality but I am concerned that it could represent an oncologic process of the gallbladder or liver. This would explain her early satiety and weight loss.  We'll be obtaining formal abdominal ultrasound for further evaluation. Left hip pain: Suspect iliopsoas strain however cannot rule out fracture therefore films will be obtained and further management will be determined based on the results of the films.  Return if symptoms worsen or fail to improve.

## 2014-03-26 NOTE — Telephone Encounter (Signed)
Seth Bake, Will you please let patient know that her xray did not show any fracture in the left hip.  I'd recommend starting cyclobenzaprine which is a muscle relaxer that I've sent to her pharmacy.  I believe she's strained a collection of muscles in her groin. (can you please provide her with the hip flexor strain hand out that Dr. Darene Lamer and Suanne Marker have on their computer).  Treatment includes this medication, rest, and a home rehab handout to be done daily for two weeks.

## 2014-03-27 ENCOUNTER — Telehealth: Payer: Self-pay | Admitting: Family Medicine

## 2014-03-27 DIAGNOSIS — R160 Hepatomegaly, not elsewhere classified: Secondary | ICD-10-CM | POA: Insufficient documentation

## 2014-03-27 NOTE — Telephone Encounter (Signed)
Alexa Stephens, Will you please let patient know that the mass that can be felt on her abdomen is part of her liver, for further evaluation of this enlargement a CT scan of the abdomen and pelvis has been recommended, I've placed an order for this to be done in [place].  If not contacted about this by Monday please let me know ASAP

## 2014-03-27 NOTE — Telephone Encounter (Signed)
Patient advised of results and recommendations.  

## 2014-03-31 ENCOUNTER — Telehealth: Payer: Self-pay | Admitting: Pulmonary Disease

## 2014-03-31 NOTE — Telephone Encounter (Signed)
Pt called back wanting to be put back on RA schedule for 04/02/14 at 3:45, I placed her back on RA schedule and informed her that she can not see two pulmonary doctors, patient was waiting to stay on VS schedule, stating the only reason she wanted to see RA was because VS does not have anything available until 2/10. Patient agreed to stay with RA on 04/02/14.

## 2014-03-31 NOTE — Telephone Encounter (Signed)
Nothing further needed at this time. 

## 2014-03-31 NOTE — Telephone Encounter (Signed)
lmtcb for pt.  

## 2014-04-01 ENCOUNTER — Telehealth: Payer: Self-pay | Admitting: *Deleted

## 2014-04-01 ENCOUNTER — Telehealth: Payer: Self-pay

## 2014-04-01 DIAGNOSIS — R1901 Right upper quadrant abdominal swelling, mass and lump: Secondary | ICD-10-CM

## 2014-04-01 NOTE — Telephone Encounter (Signed)
Patient called and left a message wanting more information about results.

## 2014-04-01 NOTE — Telephone Encounter (Signed)
Labs for CT

## 2014-04-02 ENCOUNTER — Encounter: Payer: Self-pay | Admitting: Pulmonary Disease

## 2014-04-02 ENCOUNTER — Ambulatory Visit (INDEPENDENT_AMBULATORY_CARE_PROVIDER_SITE_OTHER): Payer: Medicare Other | Admitting: Pulmonary Disease

## 2014-04-02 ENCOUNTER — Institutional Professional Consult (permissible substitution): Payer: Medicare Other | Admitting: Pulmonary Disease

## 2014-04-02 VITALS — BP 138/84 | HR 97 | Temp 98.3°F | Ht 62.0 in | Wt 90.0 lb

## 2014-04-02 DIAGNOSIS — J9611 Chronic respiratory failure with hypoxia: Secondary | ICD-10-CM

## 2014-04-02 DIAGNOSIS — J441 Chronic obstructive pulmonary disease with (acute) exacerbation: Secondary | ICD-10-CM

## 2014-04-02 DIAGNOSIS — R634 Abnormal weight loss: Secondary | ICD-10-CM

## 2014-04-02 DIAGNOSIS — R160 Hepatomegaly, not elsewhere classified: Secondary | ICD-10-CM

## 2014-04-02 DIAGNOSIS — J438 Other emphysema: Secondary | ICD-10-CM

## 2014-04-02 DIAGNOSIS — K769 Liver disease, unspecified: Secondary | ICD-10-CM

## 2014-04-02 LAB — COMPREHENSIVE METABOLIC PANEL
ALBUMIN: 4 g/dL (ref 3.5–5.2)
ALK PHOS: 101 U/L (ref 39–117)
ALT: 20 U/L (ref 0–35)
AST: 36 U/L (ref 0–37)
BUN: 10 mg/dL (ref 6–23)
CO2: 36 mEq/L — ABNORMAL HIGH (ref 19–32)
CREATININE: 0.45 mg/dL — AB (ref 0.50–1.10)
Calcium: 9.8 mg/dL (ref 8.4–10.5)
Chloride: 94 mEq/L — ABNORMAL LOW (ref 96–112)
Glucose, Bld: 79 mg/dL (ref 70–99)
Potassium: 4 mEq/L (ref 3.5–5.3)
SODIUM: 139 meq/L (ref 135–145)
Total Bilirubin: 0.4 mg/dL (ref 0.2–1.2)
Total Protein: 7.2 g/dL (ref 6.0–8.3)

## 2014-04-02 LAB — CBC WITH DIFFERENTIAL/PLATELET
BASOS ABS: 0.1 10*3/uL (ref 0.0–0.1)
Basophils Relative: 1 % (ref 0–1)
EOS PCT: 2 % (ref 0–5)
Eosinophils Absolute: 0.1 10*3/uL (ref 0.0–0.7)
HEMATOCRIT: 36.7 % (ref 36.0–46.0)
HEMOGLOBIN: 12 g/dL (ref 12.0–15.0)
Lymphocytes Relative: 27 % (ref 12–46)
Lymphs Abs: 1.6 10*3/uL (ref 0.7–4.0)
MCH: 30.8 pg (ref 26.0–34.0)
MCHC: 32.7 g/dL (ref 30.0–36.0)
MCV: 94.1 fL (ref 78.0–100.0)
MONOS PCT: 8 % (ref 3–12)
MPV: 9.5 fL (ref 8.6–12.4)
Monocytes Absolute: 0.5 10*3/uL (ref 0.1–1.0)
Neutro Abs: 3.7 10*3/uL (ref 1.7–7.7)
Neutrophils Relative %: 62 % (ref 43–77)
Platelets: 334 10*3/uL (ref 150–400)
RBC: 3.9 MIL/uL (ref 3.87–5.11)
RDW: 14.9 % (ref 11.5–15.5)
WBC: 6 10*3/uL (ref 4.0–10.5)

## 2014-04-02 LAB — PROTIME-INR
INR: 0.95 (ref <1.50)
PROTHROMBIN TIME: 12.7 s (ref 11.6–15.2)

## 2014-04-02 LAB — APTT: aPTT: 30 seconds (ref 24–37)

## 2014-04-02 NOTE — Patient Instructions (Signed)
Ultrasound shows spots on your liver Blood work today CT chest/ abdomen/pelvis with contrast Stay on 2L continuous oxygen

## 2014-04-02 NOTE — Progress Notes (Signed)
   Subjective:    Patient ID: Alexa Stephens, female    DOB: 10/10/1950, 64 y.o.   MRN: [id number]  HPI  64 y.o. female smoker with severe COPD, hypoxia, and snoring.  She remains on 5 mg prednisone daily.   She is using 2 liters oxygen.  Chief Complaint  Patient presents with  . Follow-up    switching form VS to RA.  Breathing has worsened since Dec 2015.  Increased SOB, fatigues easily, and feels weak.  Cough with yellow mucus.      On Breo & ellipta She has lost 50 pounds over the last year, progressive generalized weakness She complains of increased dyspnea, oxygen saturation drops to 79% after using intermittent oxygen, stays at 92% on continuous 2 L. She is accompanied by her son-ultrasound was obtained, CT abdomen has not been scheduled She also complains of night sweats, and some dizziness while leaning forward.   Tests: Spirometry 11/01/11>>FEV1 0.48 (21%), FEV1% 37 (difficult pt effort). 11/01/11 SpO2 85% on room air at rest A1AT 06/21/12 >> 140, PI-MS 03/26/14 [place] abd >> multiple liver masses  Past Medical History  Diagnosis Date  . Anxiety disorder   . Asthma   . COPD (chronic obstructive pulmonary disease)     Not on home O2 yet but is prescribed.  . Headache(784.0)   . Hyperlipemia   . Hypertension   . Pneumonia Sept 2013  . Sepsis Sept 2013     Review of Systems  Constitutional: Positive for unexpected weight change. Negative for fever.  HENT: Positive for congestion, postnasal drip, sinus pressure, sore throat and trouble swallowing. Negative for dental problem, ear pain, nosebleeds, rhinorrhea and sneezing.   Eyes: Negative for redness and itching.  Respiratory: Positive for cough, chest tightness, shortness of breath and wheezing.   Cardiovascular: Negative for palpitations and leg swelling.  Gastrointestinal: Positive for nausea. Negative for vomiting.  Genitourinary: Negative for dysuria.  Musculoskeletal: Negative for joint swelling.  Skin: Negative  for rash.  Neurological: Positive for headaches.  Hematological: Bruises/bleeds easily.  Psychiatric/Behavioral: Negative for dysphoric mood. The patient is not nervous/anxious.        Objective:   Physical Exam  Gen. Pleasant, cachectic, in no distress, normal affect ENT - no lesions, no post nasal drip Neck: No JVD, no thyromegaly, no carotid bruits Lungs: no use of accessory muscles, no dullness to percussion, decreased without rales or rhonchi  Cardiovascular: Rhythm regular, heart sounds  normal, no murmurs or gallops, no peripheral edema Abdomen: soft and non-tender, no hepatosplenomegaly, BS normal. Musculoskeletal: No deformities, no cyanosis or clubbing Neuro:  alert, non focal       Assessment & Plan:

## 2014-04-02 NOTE — Assessment & Plan Note (Signed)
Intermittent oxygen-does not seem to be enough for her. She will need to use continuous oxygen at 2 L. She'll continue using DuoNeb's, prednisone dosed at 5 mg for now

## 2014-04-02 NOTE — Assessment & Plan Note (Signed)
This seems to be the main issue now. I am afraid we are dealing with metastatic liver cancer. I doubt infectious etiology. We will try to expedite her CT abdomen and pelvis with contrast, also obtained CT chest at the same time. Blood work will be obtained today-in anticipation of liver biopsy.

## 2014-04-03 ENCOUNTER — Ambulatory Visit (HOSPITAL_BASED_OUTPATIENT_CLINIC_OR_DEPARTMENT_OTHER)
Admission: RE | Admit: 2014-04-03 | Discharge: 2014-04-03 | Disposition: A | Discharge status: Home / Self Care | Payer: Medicare Other | Source: Ambulatory Visit | Attending: Pulmonary Disease | Admitting: Pulmonary Disease

## 2014-04-03 ENCOUNTER — Telehealth: Payer: Self-pay | Admitting: Pulmonary Disease

## 2014-04-03 DIAGNOSIS — R634 Abnormal weight loss: Secondary | ICD-10-CM | POA: Diagnosis present

## 2014-04-03 DIAGNOSIS — R599 Enlarged lymph nodes, unspecified: Secondary | ICD-10-CM | POA: Insufficient documentation

## 2014-04-03 DIAGNOSIS — R160 Hepatomegaly, not elsewhere classified: Secondary | ICD-10-CM

## 2014-04-03 DIAGNOSIS — C787 Secondary malignant neoplasm of liver and intrahepatic bile duct: Secondary | ICD-10-CM | POA: Insufficient documentation

## 2014-04-03 DIAGNOSIS — R5383 Other fatigue: Secondary | ICD-10-CM | POA: Diagnosis not present

## 2014-04-03 DIAGNOSIS — J439 Emphysema, unspecified: Secondary | ICD-10-CM | POA: Diagnosis not present

## 2014-04-03 DIAGNOSIS — J438 Other emphysema: Secondary | ICD-10-CM

## 2014-04-03 DIAGNOSIS — K769 Liver disease, unspecified: Secondary | ICD-10-CM

## 2014-04-03 MED ORDER — IOHEXOL 300 MG/ML  SOLN
100.0000 mL | Freq: Once | INTRAMUSCULAR | Status: AC | PRN
  Administered 2014-04-03: 100 mL via INTRAVENOUS

## 2014-04-03 NOTE — Telephone Encounter (Signed)
Pt's son has called for results - [phone]

## 2014-04-03 NOTE — Telephone Encounter (Signed)
Order put in for u/s guided biopsy of liver mass by IR.    Nothing further needed at this time.

## 2014-04-03 NOTE — Telephone Encounter (Signed)
I discussed results with the patient and her son [name] Please schedule-ultrasound-guided biopsy of liver mass-by IR Discussed procedure with patient and son

## 2014-04-03 NOTE — Telephone Encounter (Signed)
Spoke with Dr Elsworth Soho, gave verbal reports as seen below. Dr Elsworth Soho states that he will call them to discuss.

## 2014-04-03 NOTE — Telephone Encounter (Signed)
Received call report from [place] w/ [facility] on CT Chest/Abd: IMPRESSION: CT chest: Multifocal adenopathy. Several nodular lesions concerning for small metastases. There is superior segment left lower lobe airspace consolidation. Underlying emphysema.  CT abdomen and pelvis: Sizable liver metastases. No appreciable adenopathy in the abdomen or pelvis.  Extensive prominence of pelvic vascular structures possibly could have neoplastic etiology but also raises concern for pelvic congestion syndrome.  No bowel obstruction. No abscess.  Extensive atherosclerotic change.  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 Primary site of carcinoma uncertain given this combination of findings.  Will forward message to Dr Elsworth Soho regarding results RA also paged at 2:15pm

## 2014-04-06 ENCOUNTER — Other Ambulatory Visit: Payer: Self-pay | Admitting: *Deleted

## 2014-04-06 DIAGNOSIS — J441 Chronic obstructive pulmonary disease with (acute) exacerbation: Secondary | ICD-10-CM

## 2014-04-06 MED ORDER — ALBUTEROL SULFATE HFA 108 (90 BASE) MCG/ACT IN AERS: 2.0000 | INHALATION_SPRAY | RESPIRATORY_TRACT | Status: DC | PRN

## 2014-04-07 ENCOUNTER — Other Ambulatory Visit: Payer: Self-pay | Admitting: Radiology

## 2014-04-08 ENCOUNTER — Telehealth: Payer: Self-pay | Admitting: Pulmonary Disease

## 2014-04-08 ENCOUNTER — Other Ambulatory Visit: Payer: Self-pay | Admitting: Radiology

## 2014-04-08 ENCOUNTER — Ambulatory Visit: Payer: Medicare Other | Admitting: Pulmonary Disease

## 2014-04-08 NOTE — Telephone Encounter (Signed)
Called and spoke with pt and she stated that she is schedule to have her biopsy tomorrow.  She wants to know if she has any type of cancer in her lungs.  She stated that she is a little confused about her health right now and wanted to call and find out from RA about her lungs.  RA please advise. Thanks  Allergies  Allergen Reactions  . Flexeril [Cyclobenzaprine] Other (See Comments)    SKIN NUMB  . Lexapro [Escitalopram]     Gi upset  . Naproxen     High BP  . Skelaxin [Metaxalone] Other (See Comments)    DIDN'T WORK    Current Outpatient Prescriptions on File Prior to Visit  Medication Sig Dispense Refill  . albuterol (PROVENTIL HFA;VENTOLIN HFA) 108 (90 BASE) MCG/ACT inhaler Inhale 2 puffs into the lungs every 4 (four) hours as needed. For shortness of breath. 1 Inhaler 5  . albuterol (PROVENTIL) (2.5 MG/3ML) 0.083% nebulizer solution Inhale 3 mLs into the lungs every 6 (six) hours as needed for wheezing.   12  . Calcium Carbonate-Vitamin D (CALCIUM-VITAMIN D) 500-200 MG-UNIT per tablet Take 1 tablet by mouth daily. When remembers    . clonazePAM (KLONOPIN) 1 MG tablet TAKE 1 TABLET BY MOUTH TWICE DAILY AS NEEDED FOR ANXIETY 60 tablet 0  . COMBIVENT RESPIMAT 20-100 MCG/ACT AERS respimat INHALE 1 PUFF BY MOUTH EVERY 6 HOURS 4 g 0  . HYDROcodone-acetaminophen (NORCO) 10-325 MG per tablet Take 0.5-1 tablets by mouth every 8 (eight) hours as needed (headache). 60 tablet 0  . ipratropium (ATROVENT) 0.02 % nebulizer solution Take 2.5 mLs (500 mcg total) by nebulization every 6 (six) hours as needed (ICD10: J44.9). 75 mL 5  . lisinopril (PRINIVIL,ZESTRIL) 20 MG tablet Take 1 tablet (20 mg total) by mouth daily. 90 tablet 3  . Multiple Vitamin (MULTIVITAMIN) tablet Take 1 tablet by mouth daily.    . ranitidine (ZANTAC) 150 MG tablet TAKE 1 TABLET BY MOUTH TWICE A DAY TO PREVENT GASTRIC REFLUX SYMPTOMS 30 tablet 0  . rizatriptan (MAXALT-MLT) 5 MG disintegrating tablet TAKE 1 TABLET BY MOUTH AS  NEEDED FOR MIGRAINE *MAY REPEAT IN 2 HOURS IF NEEDED 10 tablet 3  . simvastatin (ZOCOR) 20 MG tablet TAKE 1 TABLET BY MOUTH EVERY DAY 30 tablet 0   No current facility-administered medications on file prior to visit.

## 2014-04-08 NOTE — Telephone Encounter (Signed)
These results were discussed with the patient on 2/5 Biopsy is scheduled for 2/11

## 2014-04-08 NOTE — Telephone Encounter (Signed)
Patient would like to know before the day is over what her CT results are.  She is worried about cancer.

## 2014-04-08 NOTE — Telephone Encounter (Signed)
lmtcb

## 2014-04-08 NOTE — Telephone Encounter (Signed)
Spoke with pt, clarified that the biopsy tomorrow is of her liver. Also gave pt address and phone number of [facility].  Nothing further needed.

## 2014-04-08 NOTE — Telephone Encounter (Signed)
Patient says that she doesn't remember.  She is asking if she has cancer.

## 2014-04-08 NOTE — Telephone Encounter (Signed)
I have discussed with her son-she can talk to him for more details -she is to get a biopsy tomorrow to confirm diagnosis of cancer

## 2014-04-09 ENCOUNTER — Ambulatory Visit (HOSPITAL_COMMUNITY)
Admission: RE | Admit: 2014-04-09 | Discharge: 2014-04-09 | Disposition: A | Discharge status: Home / Self Care | Payer: Medicare Other | Source: Ambulatory Visit | Attending: Pulmonary Disease | Admitting: Pulmonary Disease

## 2014-04-09 ENCOUNTER — Inpatient Hospital Stay (HOSPITAL_COMMUNITY): Admission: RE | Admit: 2014-04-09 | Payer: Medicare Other | Source: Ambulatory Visit

## 2014-04-09 ENCOUNTER — Encounter (HOSPITAL_COMMUNITY): Payer: Self-pay

## 2014-04-09 DIAGNOSIS — J449 Chronic obstructive pulmonary disease, unspecified: Secondary | ICD-10-CM | POA: Insufficient documentation

## 2014-04-09 DIAGNOSIS — E785 Hyperlipidemia, unspecified: Secondary | ICD-10-CM | POA: Insufficient documentation

## 2014-04-09 DIAGNOSIS — I10 Essential (primary) hypertension: Secondary | ICD-10-CM | POA: Insufficient documentation

## 2014-04-09 DIAGNOSIS — C787 Secondary malignant neoplasm of liver and intrahepatic bile duct: Secondary | ICD-10-CM | POA: Insufficient documentation

## 2014-04-09 DIAGNOSIS — F17210 Nicotine dependence, cigarettes, uncomplicated: Secondary | ICD-10-CM | POA: Insufficient documentation

## 2014-04-09 DIAGNOSIS — Z79899 Other long term (current) drug therapy: Secondary | ICD-10-CM | POA: Diagnosis not present

## 2014-04-09 DIAGNOSIS — R160 Hepatomegaly, not elsewhere classified: Secondary | ICD-10-CM

## 2014-04-09 LAB — PROTIME-INR
INR: 0.94 (ref 0.00–1.49)
Prothrombin Time: 12.7 seconds (ref 11.6–15.2)

## 2014-04-09 LAB — COMPREHENSIVE METABOLIC PANEL
ALBUMIN: 3.9 g/dL (ref 3.5–5.2)
ALK PHOS: 103 U/L (ref 39–117)
ALT: 24 U/L (ref 0–35)
AST: 39 U/L — AB (ref 0–37)
Anion gap: 6 (ref 5–15)
BUN: 12 mg/dL (ref 6–23)
CO2: 38 mmol/L — ABNORMAL HIGH (ref 19–32)
CREATININE: 0.38 mg/dL — AB (ref 0.50–1.10)
Calcium: 9.3 mg/dL (ref 8.4–10.5)
Chloride: 97 mmol/L (ref 96–112)
GFR calc Af Amer: >90 mL/min (ref >90)
GFR calc non Af Amer: >90 mL/min (ref >90)
Glucose, Bld: 91 mg/dL (ref 70–99)
Potassium: 3.5 mmol/L (ref 3.5–5.1)
Sodium: 141 mmol/L (ref 135–145)
Total Bilirubin: 0.6 mg/dL (ref 0.3–1.2)
Total Protein: 7.2 g/dL (ref 6.0–8.3)

## 2014-04-09 LAB — CBC WITH DIFFERENTIAL/PLATELET
BASOS PCT: 1 % (ref 0–1)
Basophils Absolute: 0 10*3/uL (ref 0.0–0.1)
EOS ABS: 0.2 10*3/uL (ref 0.0–0.7)
Eosinophils Relative: 3 % (ref 0–5)
HEMATOCRIT: 35.9 % — AB (ref 36.0–46.0)
HEMOGLOBIN: 11 g/dL — AB (ref 12.0–15.0)
LYMPHS ABS: 1.5 10*3/uL (ref 0.7–4.0)
Lymphocytes Relative: 23 % (ref 12–46)
MCH: 30.2 pg (ref 26.0–34.0)
MCHC: 30.6 g/dL (ref 30.0–36.0)
MCV: 98.6 fL (ref 78.0–100.0)
MONO ABS: 0.5 10*3/uL (ref 0.1–1.0)
Monocytes Relative: 8 % (ref 3–12)
Neutro Abs: 4.2 10*3/uL (ref 1.7–7.7)
Neutrophils Relative %: 65 % (ref 43–77)
Platelets: 320 10*3/uL (ref 150–400)
RBC: 3.64 MIL/uL — AB (ref 3.87–5.11)
RDW: 14 % (ref 11.5–15.5)
WBC: 6.4 10*3/uL (ref 4.0–10.5)

## 2014-04-09 LAB — APTT: APTT: 30 s (ref 24–37)

## 2014-04-09 MED ORDER — MIDAZOLAM HCL 2 MG/2ML IJ SOLN
INTRAMUSCULAR | Status: AC | PRN
  Administered 2014-04-09: 1 mg via INTRAVENOUS

## 2014-04-09 MED ORDER — FENTANYL CITRATE 0.05 MG/ML IJ SOLN
INTRAMUSCULAR | Status: AC | PRN
  Administered 2014-04-09: 50 ug via INTRAVENOUS

## 2014-04-09 MED ORDER — NALOXONE HCL 0.4 MG/ML IJ SOLN
INTRAMUSCULAR | Status: AC
  Filled 2014-04-09: qty 1

## 2014-04-09 MED ORDER — MIDAZOLAM HCL 2 MG/2ML IJ SOLN
INTRAMUSCULAR | Status: AC
  Filled 2014-04-09: qty 6

## 2014-04-09 MED ORDER — SODIUM CHLORIDE 0.9 % IV SOLN
INTRAVENOUS | Status: DC
  Administered 2014-04-09: 12:00:00 via INTRAVENOUS

## 2014-04-09 MED ORDER — HYDROCODONE-ACETAMINOPHEN 5-325 MG PO TABS
1.0000 | ORAL_TABLET | ORAL | Status: DC | PRN
  Administered 2014-04-09: 1 via ORAL
  Filled 2014-04-09: qty 1

## 2014-04-09 MED ORDER — FENTANYL CITRATE 0.05 MG/ML IJ SOLN
INTRAMUSCULAR | Status: AC
  Filled 2014-04-09: qty 4

## 2014-04-09 MED ORDER — FLUMAZENIL 0.5 MG/5ML IV SOLN
INTRAVENOUS | Status: AC
  Filled 2014-04-09: qty 5

## 2014-04-09 NOTE — Discharge Instructions (Signed)
Leave dressing on for 24 hours.  You may shower after 24 hours.  Please remove the dressing before you shower.    Liver Biopsy, Care After These instructions give you information on caring for yourself after your procedure. Your doctor may also give you more specific instructions. Call your doctor if you have any problems or questions after your procedure. HOME CARE  Rest at home for 1-2 days or as told by your doctor.  Have someone stay with you for at least 24 hours.  Do not do these things in the first 24 hours:  Drive.  Use machinery.  Take care of other people.  Sign legal documents.  Take a bath or shower.  There are many different ways to close and cover a cut (incision). For example, a cut can be closed with stitches, skin glue, or adhesive strips. Follow your doctor's instructions on:  Taking care of your cut.  Changing and removing your bandage (dressing).  Removing whatever was used to close your cut.  Do not drink alcohol in the first week.  Do not lift more than 5 pounds or play contact sports for the first 2 weeks.  Take medicines only as told by your doctor. For 1 week, do not take medicine that has aspirin in it or medicines like ibuprofen.  Get your test results. GET HELP IF:  A cut bleeds and leaves more than just a small spot of blood.  A cut is red, puffs up (swells), or hurts more than before.  Fluid or something else comes from a cut.  A cut smells bad.  You have a fever or chills. GET HELP RIGHT AWAY IF:  You have swelling, bloating, or pain in your belly (abdomen).  You get dizzy or faint.  You have a rash.  You feel sick to your stomach (nauseous) or throw up (vomit).  You have trouble breathing, feel short of breath, or feel faint.  Your chest hurts.  You have problems talking or seeing.  You have trouble balancing or moving your arms or legs. Document Released: 11/23/2007 Document Revised: 06/30/2013 Document Reviewed:  04/11/2013 [facility] Patient Information 2015 [place], [place]. This information is not intended to replace advice given to you by your health care provider. Make sure you discuss any questions you have with your health care provider. Conscious Sedation, Adult, Care After Refer to this sheet in the next few weeks. These instructions provide you with information on caring for yourself after your procedure. Your health care provider may also give you more specific instructions. Your treatment has been planned according to current medical practices, but problems sometimes occur. Call your health care provider if you have any problems or questions after your procedure. WHAT TO EXPECT AFTER THE PROCEDURE  After your procedure:  You may feel sleepy, clumsy, and have poor balance for several hours.  Vomiting may occur if you eat too soon after the procedure. HOME CARE INSTRUCTIONS  Do not participate in any activities where you could become injured for at least 24 hours. Do not:  Drive.  Swim.  Ride a bicycle.  Operate heavy machinery.  Cook.  Use power tools.  Climb ladders.  Work from a high place.  Do not make important decisions or sign legal documents until you are improved.  If you vomit, drink water, juice, or soup when you can drink without vomiting. Make sure you have little or no nausea before eating solid foods.  Only take over-the-counter or prescription medicines for pain,  discomfort, or fever as directed by your health care provider.  Make sure you and your family fully understand everything about the medicines given to you, including what side effects may occur.  You should not drink alcohol, take sleeping pills, or take medicines that cause drowsiness for at least 24 hours.  If you smoke, do not smoke without supervision.  If you are feeling better, you may resume normal activities 24 hours after you were sedated.  Keep all appointments with your health care  provider. SEEK MEDICAL CARE IF:  Your skin is pale or bluish in color.  You continue to feel nauseous or vomit.  Your pain is getting worse and is not helped by medicine.  You have bleeding or swelling.  You are still sleepy or feeling clumsy after 24 hours. SEEK IMMEDIATE MEDICAL CARE IF:  You develop a rash.  You have difficulty breathing.  You develop any type of allergic problem.  You have a fever. MAKE SURE YOU:  Understand these instructions.  Will watch your condition.  Will get help right away if you are not doing well or get worse. Document Released: 12/04/2012 Document Reviewed: 12/04/2012 [facility] Patient Information 2015 [address], [place]. This information is not intended to replace advice given to you by your health care provider. Make sure you discuss any questions you have with your health care provider.

## 2014-04-09 NOTE — Procedures (Signed)
US core liver lesion 18g x3 to surg path No complication No blood loss. See complete dictation in [facility] PACS.  

## 2014-04-09 NOTE — H&P (Signed)
Chief Complaint: "I'm here for a liver biopsy"  Referring Physician(s): Alva,Rakesh V  History of Present Illness: Alexa Stephens is a 64 y.o. female smoker with history of weight loss, early satiety, abdominal pain, night sweats and recent imaging revealing liver lesions, multifocal chest adenopathy. Pt has no known hx of cancer. She presents today for US guided liver lesion biopsy.  Past Medical History  Diagnosis Date  . Anxiety disorder   . Asthma   . COPD (chronic obstructive pulmonary disease)     Not on home O2 yet but is prescribed.  . Headache(784.0)   . Hyperlipemia   . Hypertension   . Pneumonia Sept 2013  . Sepsis Sept 2013    Past Surgical History  Procedure Laterality Date  . Cosmetic surgery    . Cystectomy    . Hand surgery      Allergies: Flexeril; Lexapro; Naproxen; and Skelaxin  Medications: Prior to Admission medications   Medication Sig Start Date End Date Taking? Authorizing Provider  albuterol (PROVENTIL HFA;VENTOLIN HFA) 108 (90 BASE) MCG/ACT inhaler Inhale 2 puffs into the lungs every 4 (four) hours as needed. For shortness of breath. 04/06/14  Yes Sean Hommel, DO  albuterol (PROVENTIL) (2.5 MG/3ML) 0.083% nebulizer solution Inhale 3 mLs into the lungs every 6 (six) hours as needed for wheezing.  01/07/14  Yes Historical Provider, MD  Calcium Carbonate-Vitamin D (CALCIUM-VITAMIN D) 500-200 MG-UNIT per tablet Take 1 tablet by mouth daily. When remembers   Yes Historical Provider, MD  clonazePAM (KLONOPIN) 1 MG tablet TAKE 1 TABLET BY MOUTH TWICE DAILY AS NEEDED FOR ANXIETY 02/23/14  Yes Sean Hommel, DO  COMBIVENT RESPIMAT 20-100 MCG/ACT AERS respimat INHALE 1 PUFF BY MOUTH EVERY 6 HOURS 12/23/13  Yes Chesley Mires, MD  HYDROcodone-acetaminophen (NORCO) 10-325 MG per tablet Take 0.5-1 tablets by mouth every 8 (eight) hours as needed (headache). 02/03/14  Yes Sean Hommel, DO  ipratropium (ATROVENT) 0.02 % nebulizer solution Take 2.5 mLs (500 mcg total) by  nebulization every 6 (six) hours as needed (ICD10: J44.9). 12/23/13  Yes Chesley Mires, MD  lisinopril (PRINIVIL,ZESTRIL) 20 MG tablet Take 1 tablet (20 mg total) by mouth daily. 02/03/14  Yes [name], DO  Multiple Vitamin (MULTIVITAMIN) tablet Take 1 tablet by mouth daily.   Yes Historical Provider, MD  ranitidine (ZANTAC) 150 MG tablet TAKE 1 TABLET BY MOUTH TWICE A DAY TO PREVENT GASTRIC REFLUX SYMPTOMS 01/07/14  Yes Sean Hommel, DO  rizatriptan (MAXALT-MLT) 5 MG disintegrating tablet TAKE 1 TABLET BY MOUTH AS NEEDED FOR MIGRAINE *MAY REPEAT IN 2 HOURS IF NEEDED 02/13/14  Yes Sean Hommel, DO  simvastatin (ZOCOR) 20 MG tablet TAKE 1 TABLET BY MOUTH EVERY DAY 02/09/14  Yes [name], DO    Family History  Problem Relation Age of Onset  . Lung cancer Maternal Grandfather   . Cancer Maternal Grandmother     History   Social History  . Marital Status: Single    Spouse Name: N/A  . Number of Children: N/A  . Years of Education: N/A   Occupational History  . retired    Social History Main Topics  . Smoking status: Current Some Day Smoker -- 0.02 packs/day for 40 years    Types: Cigarettes  . Smokeless tobacco: Not on file     Comment: 1 pack per 3-4 days 06/11/13  . Alcohol Use: No  . Drug Use: No  . Sexual Activity: Not on file   Other Topics Concern  .  None   Social History Narrative      Review of Systems  Constitutional: Positive for appetite change, fatigue and unexpected weight change. Negative for fever and chills.  Respiratory: Positive for cough and shortness of breath.   Cardiovascular: Negative for chest pain.  Gastrointestinal: Positive for abdominal pain. Negative for nausea, vomiting and blood in stool.  Genitourinary: Negative for dysuria and hematuria.  Musculoskeletal: Negative for back pain.  Neurological:       Occ HA's  Psychiatric/Behavioral: The patient is nervous/anxious.     Vital Signs: BP 154/74 mmHg  Pulse 85  Temp(Src) 98.2 F (36.8  C) (Oral)  Resp 16  SpO2 100%  Physical Exam  Constitutional:  Cachetic appearing WF in NAD  Cardiovascular: Normal rate and regular rhythm.   Pulmonary/Chest:  Distant BS bilat with few wheezes  Abdominal: Soft. Bowel sounds are normal. There is tenderness.  Hepatomegaly with firm , mildly tender epigastric mass    Imaging: Ct Chest Wo Contrast  04/03/2014   CLINICAL DATA:  Emphysema.  Weight loss and fatigue  EXAM: CT CHEST WITHOUT CONTRAST; CT ABDOMEN AND PELVIS WITH CONTRAST  TECHNIQUE: Multidetector CT imaging of the chest was performed following the standard protocol without IV contrast material administration. Multidetector CT imaging of the abdomen and pelvis was performed following the standard protocol following intravenous contrast maternal administration.  COMPARISON:  December 25, 2013 chest CT  CONTRAST:  100 mL Isovue 370 nonionic  FINDINGS: CT chest: There is extensive centrilobular and paraseptal emphysematous change. There is airspace consolidation in the superior segment of the left lower lobe. There is mild scarring in the bases.  On axial slice 24 series 3, there is a nodular opacity measuring 1.2 x 0.9 cm in the posterior segment of the right upper lobe. In the superior segment of the right lower lobe on slice 27 series 3, there is a nodular opacity measuring 6 x 4 mm. On axial slice 30 series 3, there is a 2 mm nodular opacity in the medial segment of the right middle lobe. On axial slice 38 series 3, there is a 4 mm nodular opacity in the posterior aspect of the superior segment of the right lower lobe. There are multiple nodular opacities in the posterior segment of the right lower lobe, largest measuring 5 mm, seen on axial slice 49 series 3. Several 2-3 mm nodular opacities are noted in the posterior segment of the left lower lobe as well. There is a nodular opacity which is somewhat ill-defined in the superior segment of the left lower lobe on slice 33 series 3 measuring  1.0 x 0.6 cm.  There is fairly extensive adenopathy. There is a pretracheal lymph node measuring 2.4 x 2.1 cm. A second lymph node just anterior the carina measures 1.6 x 1.6 cm. There is a lymph node immediately adjacent to the aortic arch on the left measuring 2.4 x 1.5 cm. There is prominence in the left hilum, felt to represent adenopathy. It is difficult to distinguish lymph node from adjacent vessels in this area without intravenous contrast. There is an enlarged sub- carinal lymph node measuring 2.4 x 1.4 cm.  There is atherosclerotic change in the aorta but no appreciable thoracic aortic aneurysm. No pulmonary embolus is identified on this noncontrast enhanced study. There is mild pericardial thickening anteriorly. There are no blastic or lytic bone lesions. Thyroid appears normal.  CT abdomen and pelvis: Liver is enlarged measuring 18.2 cm in length. There are metastatic foci throughout the  liver. In the medial segment left lobe of the liver, there is a mass measuring 4.3 x 4.9 cm. In the anterior segment of the right lobe, there is a mass measuring 3.9 x 3.6 cm. In the inferior posterior segment of the right lobe of the liver, there is a 3.6 x 3.1 cm mass. There are a few subcentimeter areas of decreased attenuation that must be viewed as nonspecific in the liver. Gallbladder is not seen. Question surgical absence. There is no biliary duct dilatation.  Spleen, pancreas, and adrenals appear within normal limits. Kidneys bilaterally show no mass or hydronephrosis on either side. No renal or1 ureteral calculus is appreciable on either side.  In the pelvis, the urinary bladder is midline with wall thickness within normal limits. There is extensive increase in pelvic vascularity bilaterally in a generalized manner. There is no well-defined pelvic mass. Neither ovary is appreciably enlarged by CT. The appendix region appears normal.  There is no bowel obstruction. No free air or portal venous air. There is no  appreciable ascites, adenopathy, or abscess in the abdomen or pelvis. There are no appreciable omental lesions in the abdomen or pelvis. There is extensive atherosclerotic calcification in the aorta and iliac arteries. No aneurysm is seen. There is degenerative change in the lumbar spine. There are no blastic or lytic bone lesions.  IMPRESSION: CT chest: Multifocal adenopathy. Several nodular lesions concerning for small metastases. There is superior segment left lower lobe airspace consolidation. Underlying emphysema.  CT abdomen and pelvis: Sizable liver metastases. No appreciable adenopathy in the abdomen or pelvis.  Extensive prominence of pelvic vascular structures possibly could have neoplastic etiology but also raises concern for pelvic congestion syndrome.  No bowel obstruction.  No abscess.  Extensive atherosclerotic change.  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 Primary site of carcinoma uncertain given this combination of findings.  These results will be called to the ordering clinician or representative by the Radiologist Assistant, and communication documented in the PACS or zVision Dashboard.   Electronically Signed   By: Lowella Grip M.D.   On: 04/03/2014 13:19   US Abdomen Complete  03/27/2014   CLINICAL DATA:  Initial encounter for abdominal mass in right upper quadrant. Early satiety.  EXAM: ULTRASOUND ABDOMEN COMPLETE  COMPARISON:  None.  FINDINGS: Gallbladder: No gallstones or wall thickening visualized. No sonographic Murphy sign noted.  Common bile duct: Diameter: Normal, 1 mm.  Liver: Multiple hepatic masses are identified. These demonstrate suspicious features, including central heterogeneous hyperechogenicity and a peripheral area of relative hypo echogenicity. Example in the right lobe of the liver at 3.0 x 2.9 x 3.4 cm. A separate more anterior right liver lobe 5.3 x 3.5 x 5.2 cm mass. This corresponds to the palpable abnormality.  More inferior right liver lobe 3.3 x 2.7 x  3.4 cm lesion.  IVC: No abnormality visualized.  Pancreas: Visualized portion unremarkable.  Spleen: Size and appearance within normal limits.  Right Kidney: Length: 11.0 cm. Echogenicity within normal limits. No mass or hydronephrosis visualized.  Left Kidney: Length: 11.6 cm. Echogenicity within normal limits. No mass or hydronephrosis visualized.  Abdominal aorta: No aneurysm visualized.  Other findings: No ascites.  IMPRESSION: Multiple liver lesions, suspicious for metastatic disease. Recommend further evaluation with post contrast Abdominal pelvic CT in order to more entirely characterize liver lesions and identify a potential primary.  These results will be called to the ordering clinician or representative by the Radiologist Assistant, and communication documented in the PACS or zVision Dashboard.  Electronically Signed   By: Abigail Miyamoto M.D.   On: 03/27/2014 08:36   Ct Abdomen Pelvis W Contrast  04/03/2014   : CT chest as well as CT abdomen and pelvis studies are combined into a single dictation.   Electronically Signed   By: Lowella Grip M.D.   On: 04/03/2014 13:20   Dg Femur Min 2 Views Left  03/26/2014   CLINICAL DATA:  Groin region pain after fall  EXAM: DG FEMUR 2+V*L*  COMPARISON:  None.  FINDINGS: Frontal and lateral views were obtained. There is no apparent fracture or dislocation. Joint spaces appear intact. No erosive change.  IMPRESSION: No fracture or dislocation.  No appreciable arthropathic change.   Electronically Signed   By: Lowella Grip M.D.   On: 03/26/2014 13:12    Labs:  CBC:  Recent Labs  06/11/13 1436 04/02/14 1642 04/09/14 1120  WBC 6.5 6.0 6.4  HGB 10.9* 12.0 11.0*  HCT 33.3* 36.7 35.9*  PLT 234.0 334 320    COAGS:  Recent Labs  04/02/14 1642 04/09/14 1120  INR 0.95 0.94  APTT 30 30    BMP:  Recent Labs  06/11/13 1436 02/03/14 1457 04/02/14 1642  NA 139 142 139  K 3.6 4.2 4.0  CL 95* 94* 94*  CO2 40* 36* 36*  GLUCOSE 97 104* 79   BUN 7 7 10   CALCIUM 9.0 9.6 9.8  CREATININE 0.5 0.41* 0.45*  GFRNONAA  --  >89  --   GFRAA  --  >89  --     LIVER FUNCTION TESTS:  Recent Labs  06/11/13 1436 02/03/14 1457 04/02/14 1642  BILITOT 0.6 0.3 0.4  AST 24 24 36  ALT 20 19 20   ALKPHOS 84 79 101  PROT 7.1 7.3 7.2  ALBUMIN 3.9 4.6 4.0    TUMOR MARKERS: No results for input(s): AFPTM, CEA, CA199, CHROMGRNA in the last 8760 hours.  Assessment and Plan: Alexa Stephens is a 64 y.o. female smoker with history of weight loss, early satiety, abdominal pain, night sweats and recent imaging revealing liver lesions, multifocal chest adenopathy. Pt has no known hx of cancer. She presents today for US guided liver lesion biopsy. Details/risks of procedure d/w pt/son with their understanding and consent.    Signed: Autumn Messing 04/09/2014, 12:01 PM

## 2014-04-09 NOTE — Telephone Encounter (Signed)
Patient notified.  No questions or concerns at this time. Nothing further needed.   

## 2014-04-10 ENCOUNTER — Telehealth: Payer: Self-pay | Admitting: Pulmonary Disease

## 2014-04-10 NOTE — Telephone Encounter (Signed)
Called pt and made aware she needed to call her PCP. Nothing further needed

## 2014-04-10 NOTE — Telephone Encounter (Signed)
Spoke with pt, states she had liver biopsy yesterday- since biopsy has had a headache, nausea but no vomiting.  Pt wants to know if it is safe for her to take Rizatriptan.  Also wants to know if this is a normal symptom post biopsy and see if you have any further recs.   RA please advise.  Thank you.

## 2014-04-10 NOTE — Telephone Encounter (Signed)
OK to take rizatriptan for headache No new recomm Pl make appt with TP next week to discuss results

## 2014-04-10 NOTE — Telephone Encounter (Signed)
Called and spoke to pt. Informed pt of the recs per RA. Appt made with TP on 05/15/14. Pt verbalized understanding and denied any further questions or concerns at this time.

## 2014-04-10 NOTE — Telephone Encounter (Signed)
RA - please advise. Thanks! 

## 2014-04-13 ENCOUNTER — Other Ambulatory Visit: Payer: Self-pay | Admitting: *Deleted

## 2014-04-13 ENCOUNTER — Telehealth: Payer: Self-pay | Admitting: Pulmonary Disease

## 2014-04-13 ENCOUNTER — Telehealth: Payer: Self-pay | Admitting: *Deleted

## 2014-04-13 ENCOUNTER — Telehealth: Payer: Self-pay | Admitting: Hematology

## 2014-04-13 DIAGNOSIS — R160 Hepatomegaly, not elsewhere classified: Secondary | ICD-10-CM

## 2014-04-13 DIAGNOSIS — J441 Chronic obstructive pulmonary disease with (acute) exacerbation: Secondary | ICD-10-CM

## 2014-04-13 MED ORDER — ALBUTEROL SULFATE HFA 108 (90 BASE) MCG/ACT IN AERS: 2.0000 | INHALATION_SPRAY | RESPIRATORY_TRACT | Status: DC | PRN

## 2014-04-13 NOTE — Telephone Encounter (Signed)
Refill sent to pharmacy.  Nothing further needed.

## 2014-04-13 NOTE — Telephone Encounter (Signed)
Spoke with patient-she is aware that RA is covering hospital and will not be in office until 04-22-14. Pt is aware that I will page RA asking he call the patient today as she is very concerned about her health and has questions.

## 2014-04-13 NOTE — Telephone Encounter (Signed)
Gave her results - asked her to keep appt with TP for more counselling Please make appt at [facility]

## 2014-04-13 NOTE — Telephone Encounter (Signed)
Referral for [facility] placed.  Awaiting appointment to be scheduled.  Nothing further needed.

## 2014-04-13 NOTE — Telephone Encounter (Signed)
Patient calling to get biopsy results.  Dr. Elsworth Soho to call patient to discuss with her.

## 2014-04-13 NOTE — Telephone Encounter (Signed)
Pt confirmed appt on 04/29/14 at 10:30am w/Feng QK:[id number] mass Referring Dr. Elsworth Soho

## 2014-04-13 NOTE — Telephone Encounter (Signed)
Called to schedule for thoracic clinic on 04/16/14 arrival at 2:00.  Patient verbalized understanding of appt time and place.  I also gave her the address.

## 2014-04-14 ENCOUNTER — Telehealth: Payer: Self-pay | Admitting: *Deleted

## 2014-04-14 NOTE — Telephone Encounter (Signed)
LMTC x 1  

## 2014-04-14 NOTE — Telephone Encounter (Signed)
She can discuss her questions on OV with TP that has been scheduled

## 2014-04-15 ENCOUNTER — Telehealth: Payer: Self-pay | Admitting: Pulmonary Disease

## 2014-04-15 ENCOUNTER — Encounter: Payer: Self-pay | Admitting: Family Medicine

## 2014-04-15 NOTE — Telephone Encounter (Signed)
Called and spoke to pt. Informed pt of the recs per RA. Pt verbalized understanding and denied any further questions or concerns at this time.

## 2014-04-15 NOTE — Telephone Encounter (Signed)
Called and spoke to pt. Pt stated her albuterol inhaler is costing her more because she is trying to fill the medication too soon. Pt has 50 puffs left in albuterol inhaler, pt stated she is using her inhaler very often. Advised pt to use her neb meds (pt was not using) if needed and to be sure to keep appt with TP on 2/23. Pt verbalized understanding and denied any further questions or concerns at this time.

## 2014-04-16 ENCOUNTER — Encounter: Payer: Self-pay | Admitting: *Deleted

## 2014-04-16 ENCOUNTER — Ambulatory Visit (HOSPITAL_BASED_OUTPATIENT_CLINIC_OR_DEPARTMENT_OTHER): Payer: Medicare Other | Admitting: Internal Medicine

## 2014-04-16 ENCOUNTER — Telehealth: Payer: Self-pay | Admitting: Internal Medicine

## 2014-04-16 ENCOUNTER — Ambulatory Visit
Admission: RE | Admit: 2014-04-16 | Discharge: 2014-04-16 | Disposition: A | Discharge status: Home / Self Care | Payer: Medicare Other | Source: Ambulatory Visit | Attending: Radiation Oncology | Admitting: Radiation Oncology

## 2014-04-16 ENCOUNTER — Ambulatory Visit: Payer: Medicare Other | Attending: Internal Medicine | Admitting: Physical Therapy

## 2014-04-16 ENCOUNTER — Other Ambulatory Visit (HOSPITAL_BASED_OUTPATIENT_CLINIC_OR_DEPARTMENT_OTHER): Payer: Medicare Other

## 2014-04-16 ENCOUNTER — Encounter: Payer: Self-pay | Admitting: Internal Medicine

## 2014-04-16 ENCOUNTER — Ambulatory Visit: Payer: Medicare Other | Admitting: Adult Health

## 2014-04-16 VITALS — BP 127/62 | HR 95 | Temp 98.8°F | Resp 18 | Ht 61.0 in | Wt 92.0 lb

## 2014-04-16 DIAGNOSIS — F419 Anxiety disorder, unspecified: Secondary | ICD-10-CM

## 2014-04-16 DIAGNOSIS — C7A8 Other malignant neuroendocrine tumors: Secondary | ICD-10-CM

## 2014-04-16 DIAGNOSIS — C3490 Malignant neoplasm of unspecified part of unspecified bronchus or lung: Secondary | ICD-10-CM | POA: Insufficient documentation

## 2014-04-16 DIAGNOSIS — J449 Chronic obstructive pulmonary disease, unspecified: Secondary | ICD-10-CM

## 2014-04-16 DIAGNOSIS — R5381 Other malaise: Secondary | ICD-10-CM | POA: Diagnosis present

## 2014-04-16 DIAGNOSIS — M25552 Pain in left hip: Secondary | ICD-10-CM | POA: Insufficient documentation

## 2014-04-16 DIAGNOSIS — C3492 Malignant neoplasm of unspecified part of left bronchus or lung: Secondary | ICD-10-CM

## 2014-04-16 DIAGNOSIS — R160 Hepatomegaly, not elsewhere classified: Secondary | ICD-10-CM

## 2014-04-16 DIAGNOSIS — I10 Essential (primary) hypertension: Secondary | ICD-10-CM

## 2014-04-16 DIAGNOSIS — K769 Liver disease, unspecified: Secondary | ICD-10-CM

## 2014-04-16 LAB — CBC WITH DIFFERENTIAL/PLATELET
BASO%: 0.7 % (ref 0.0–2.0)
Basophils Absolute: 0 10*3/uL (ref 0.0–0.1)
EOS%: 1.7 % (ref 0.0–7.0)
Eosinophils Absolute: 0.1 10*3/uL (ref 0.0–0.5)
HEMATOCRIT: 34.5 % — AB (ref 34.8–46.6)
HGB: 10.8 g/dL — ABNORMAL LOW (ref 11.6–15.9)
LYMPH%: 17.9 % (ref 14.0–49.7)
MCH: 29.6 pg (ref 25.1–34.0)
MCHC: 31.4 g/dL — AB (ref 31.5–36.0)
MCV: 94.3 fL (ref 79.5–101.0)
MONO#: 0.4 10*3/uL (ref 0.1–0.9)
MONO%: 5.5 % (ref 0.0–14.0)
NEUT#: 4.8 10*3/uL (ref 1.5–6.5)
NEUT%: 74.2 % (ref 38.4–76.8)
Platelets: 332 10*3/uL (ref 145–400)
RBC: 3.66 10*6/uL — ABNORMAL LOW (ref 3.70–5.45)
RDW: 14.2 % (ref 11.2–14.5)
WBC: 6.5 10*3/uL (ref 3.9–10.3)
lymph#: 1.2 10*3/uL (ref 0.9–3.3)

## 2014-04-16 LAB — LACTATE DEHYDROGENASE (CC13): LDH: 285 U/L — ABNORMAL HIGH (ref 125–245)

## 2014-04-16 MED ORDER — PROCHLORPERAZINE MALEATE 10 MG PO TABS: 10.0000 mg | ORAL_TABLET | Freq: Four times a day (QID) | ORAL | Status: DC | PRN

## 2014-04-16 MED ORDER — LIDOCAINE-PRILOCAINE 2.5-2.5 % EX CREA: 1.0000 "application " | TOPICAL_CREAM | CUTANEOUS | Status: DC | PRN

## 2014-04-16 NOTE — Progress Notes (Signed)
[facility] Clinical Social Work  Clinical Social Work met with patient/family at [employer] appointment to offer support and assess for psychosocial needs.  Mrs. Pusch shared she will be receiving chemotherapy and is concerned is will "be rough because it involves throwing up and fevers".  CSW explained MD reviewed all possible side effects, but patients respond to chemotherapy different and she may not experience the same side effects.  Patient was accompanied by her son- he shared his primary concern is that his mother will have difficulty caring for herself at home.  The patient currently lives with her other son.  CSW and patient/family discussed insurance benefits and patient possibly applying for Medicaid.  Patient was interested in completing advance directives today- CSW was unable to complete at this time and recommended patient call CSW to set up appointment.  Clinical Social Work briefly discussed Clinical Social Work role and [employer] support programs/services.  Clinical Social Work encouraged patient to call with any additional questions or concerns.   Polo Riley, MSW, LCSW, OSW-C Clinical Social Worker [facility] [phone]

## 2014-04-16 NOTE — Telephone Encounter (Signed)
Gave avs & calendar for February/March. Sent message in reguards to treatment through [facility].

## 2014-04-16 NOTE — Progress Notes (Signed)
[place] [phone]   [phone] Multidisciplinary thoracic oncology clinic  CONSULT NOTE  REFERRING PHYSICIAN: Dr. Kara Mead  REASON FOR CONSULTATION:  64 years old white female recently diagnosed with lung cancer  HPI Alexa Stephens is a 64 y.o. female with past medical history significant for hypertension, dyslipidemia, COPD, anemia, anxiety/depression, essential tremor as well as progressive weight loss. The patient has been followed by Dr. Halford Chessman for COPD. She recently was seen [place]. Elsworth Soho at [place] for evaluation of her condition close to home. She was complaining of progressive weight loss as well as abdominal pain. She was seen by her primary care physician who ordered an ultrasound of the abdomen and this was performed on 03/27/2014 and it showed multiple hepatic masses identified suspicious for metastatic disease. The patient was seen by Dr. Elsworth Soho and CT scan of the chest, abdomen and pelvis were performed on 04/03/2014 and it showed fairly extensive adenopathy. There is a pretracheal lymph node measuring 2.4 x 2.1 cm. A second lymph node just anterior the carina measures 1.6 x 1.6 cm. There is a lymph node immediately adjacent to the aortic arch on the left measuring 2.4 x 1.5 cm. There is prominence in the left hilum, felt to represent adenopathy. It is difficult to distinguish lymph node from adjacent vessels in this area without intravenous contrast. There is an enlarged sub- carinal lymph node measuring 2.4 x 1.4 cm. there was also multiple subcentimeter pulmonary nodules bilaterally. Liver is enlarged measuring 18.2 cm in length. There are metastatic foci throughout the liver. In the medial segment left lobe of the liver, there is a mass measuring 4.3 x 4.9 cm. In the anterior segment of the right lobe, there is a mass measuring 3.9 x 3.6 cm. In the inferior posterior segment of the right lobe of the liver, there is a 3.6 x 3.1 cm  mass. Dr. Elsworth Soho ordered ultrasound guided core biopsy of one of the liver lesion which was performed on 04/09/2014 and the final pathology (Accession: [id number]) was positive for carcinoma. The morphologic features favor adenocarcinoma but the immunohistochemical stains that were performed showed that the tumor is positive with CD56, synaptophysin, chromogranin, thyroid transcription factor-1, cytokeratin 7, and shows patchy positivity with p63. Tumor is negative with CDX-2 and cytokeratin 5/6. 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  Dr. Elsworth Soho kindly referred the patient to me today for evaluation and recommendation regarding treatment of her condition. When seen today the patient continues to complain of shortness of breath at baseline and she is currently on home oxygen. She also complains of increasing fatigue and weakness as well as anxiety. She had several pounds of weight loss recently. She also has chest pain with cough productive of clear sputum. The patient has headaches but no visual changes. She also has occasional nausea with no vomiting, abdominal pain, diarrhea or constipation. Family history significant for maternal grandfather diagnosed with lung cancer. Mother died at age 78 with gastric ulcer and father died from heart attack. The patient is divorced and she has 2 children. She was accompanied today by her son [name]. She lives in [place]. She has a history of smoking 1 pack per day for around 45 years but she quit smoking 2 months ago. She has no history of alcohol or drug abuse.  HPI  Past Medical History  Diagnosis Date  . Anxiety disorder   . Asthma   .  COPD (chronic obstructive pulmonary disease)     Not on home O2 yet but is prescribed.  . Headache(784.0)   . Hyperlipemia   . Hypertension   . Pneumonia Sept 2013  . Sepsis Sept 2013    Past Surgical History  Procedure Laterality Date  . Cosmetic surgery    .  Cystectomy    . Hand surgery      Family History  Problem Relation Age of Onset  . Lung cancer Maternal Grandfather   . Cancer Maternal Grandmother     Social History History  Substance Use Topics  . Smoking status: Current Some Day Smoker -- 0.02 packs/day for 40 years    Types: Cigarettes  . Smokeless tobacco: Not on file     Comment: 1 pack per 3-4 days 06/11/13  . Alcohol Use: No    Allergies  Allergen Reactions  . Flexeril [Cyclobenzaprine] Other (See Comments)    SKIN NUMB  . Lexapro [Escitalopram]     Gi upset  . Naproxen     High BP  . Skelaxin [Metaxalone] Other (See Comments)    DIDN'T WORK  . Tylenol [Acetaminophen] Other (See Comments)    Difficulty breathing    Current Outpatient Prescriptions  Medication Sig Dispense Refill  . albuterol (PROVENTIL HFA;VENTOLIN HFA) 108 (90 BASE) MCG/ACT inhaler Inhale 2 puffs into the lungs every 4 (four) hours as needed. For shortness of breath. 1 Inhaler 5  . albuterol (PROVENTIL) (2.5 MG/3ML) 0.083% nebulizer solution Inhale 3 mLs into the lungs every 6 (six) hours as needed for wheezing.   12  . Calcium Carbonate-Vitamin D (CALCIUM-VITAMIN D) 500-200 MG-UNIT per tablet Take 1 tablet by mouth daily. When remembers    . clonazePAM (KLONOPIN) 1 MG tablet TAKE 1 TABLET BY MOUTH TWICE DAILY AS NEEDED FOR ANXIETY 60 tablet 0  . COMBIVENT RESPIMAT 20-100 MCG/ACT AERS respimat INHALE 1 PUFF BY MOUTH EVERY 6 HOURS 4 g 0  . ipratropium (ATROVENT) 0.02 % nebulizer solution Take 2.5 mLs (500 mcg total) by nebulization every 6 (six) hours as needed (ICD10: J44.9). 75 mL 5  . lisinopril (PRINIVIL,ZESTRIL) 20 MG tablet Take 1 tablet (20 mg total) by mouth daily. 90 tablet 3  . Multiple Vitamin (MULTIVITAMIN) tablet Take 1 tablet by mouth daily.    . ranitidine (ZANTAC) 150 MG tablet TAKE 1 TABLET BY MOUTH TWICE A DAY TO PREVENT GASTRIC REFLUX SYMPTOMS 30 tablet 0  . rizatriptan (MAXALT-MLT) 5 MG disintegrating tablet TAKE 1 TABLET BY  MOUTH AS NEEDED FOR MIGRAINE *MAY REPEAT IN 2 HOURS IF NEEDED 10 tablet 3  . simvastatin (ZOCOR) 20 MG tablet TAKE 1 TABLET BY MOUTH EVERY DAY 30 tablet 0   No current facility-administered medications for this visit.    Review of Systems  Constitutional: positive for anorexia, fatigue and weight loss Eyes: negative Ears, nose, mouth, throat, and face: negative Respiratory: positive for cough, dyspnea on exertion, sputum and wheezing Cardiovascular: negative Gastrointestinal: positive for abdominal pain and nausea Genitourinary:negative Integument/breast: negative Hematologic/lymphatic: negative Musculoskeletal:positive for muscle weakness Neurological: positive for headaches Behavioral/Psych: negative Endocrine: negative Allergic/Immunologic: negative  Physical Exam  WUJ:WJXBJ, healthy, no distress, well nourished, well developed and anxious SKIN: skin color, texture, turgor are normal, no rashes or significant lesions HEAD: Normocephalic, No masses, lesions, tenderness or abnormalities EYES: normal, PERRLA EARS: External ears normal, Canals clear OROPHARYNX:no exudate, no erythema and lips, buccal mucosa, and tongue normal  NECK: supple, no adenopathy, no JVD LYMPH:  no palpable lymphadenopathy, no hepatosplenomegaly  BREAST:not examined LUNGS: expiratory wheezes bilaterally HEART: regular rate & rhythm and no murmurs ABDOMEN:abdomen soft, non-tender, normal bowel sounds and no masses or organomegaly BACK: Back symmetric, no curvature., No CVA tenderness EXTREMITIES:no joint deformities, effusion, or inflammation, no edema, no skin discoloration  NEURO: alert & oriented x 3 with fluent speech, no focal motor/sensory deficits  PERFORMANCE STATUS: ECOG 1-2  LABORATORY DATA: Lab Results  Component Value Date   WBC 6.5 04/16/2014   HGB 10.8* 04/16/2014   HCT 34.5* 04/16/2014   MCV 94.3 04/16/2014   PLT 332 04/16/2014      Chemistry      Component Value Date/Time    NA 141 04/09/2014 1120   K 3.5 04/09/2014 1120   CL 97 04/09/2014 1120   CO2 38* 04/09/2014 1120   BUN 12 04/09/2014 1120   CREATININE 0.38* 04/09/2014 1120   CREATININE 0.45* 04/02/2014 1642      Component Value Date/Time   CALCIUM 9.3 04/09/2014 1120   ALKPHOS 103 04/09/2014 1120   AST 39* 04/09/2014 1120   ALT 24 04/09/2014 1120   BILITOT 0.6 04/09/2014 1120       RADIOGRAPHIC STUDIES: Ct Chest Wo Contrast  04/03/2014   CLINICAL DATA:  Emphysema.  Weight loss and fatigue  EXAM: CT CHEST WITHOUT CONTRAST; CT ABDOMEN AND PELVIS WITH CONTRAST  TECHNIQUE: Multidetector CT imaging of the chest was performed following the standard protocol without IV contrast material administration. Multidetector CT imaging of the abdomen and pelvis was performed following the standard protocol following intravenous contrast maternal administration.  COMPARISON:  December 25, 2013 chest CT  CONTRAST:  100 mL Isovue 370 nonionic  FINDINGS: CT chest: There is extensive centrilobular and paraseptal emphysematous change. There is airspace consolidation in the superior segment of the left lower lobe. There is mild scarring in the bases.  On axial slice 24 series 3, there is a nodular opacity measuring 1.2 x 0.9 cm in the posterior segment of the right upper lobe. In the superior segment of the right lower lobe on slice 27 series 3, there is a nodular opacity measuring 6 x 4 mm. On axial slice 30 series 3, there is a 2 mm nodular opacity in the medial segment of the right middle lobe. On axial slice 38 series 3, there is a 4 mm nodular opacity in the posterior aspect of the superior segment of the right lower lobe. There are multiple nodular opacities in the posterior segment of the right lower lobe, largest measuring 5 mm, seen on axial slice 49 series 3. Several 2-3 mm nodular opacities are noted in the posterior segment of the left lower lobe as well. There is a nodular opacity which is somewhat ill-defined in the  superior segment of the left lower lobe on slice 33 series 3 measuring 1.0 x 0.6 cm.  There is fairly extensive adenopathy. There is a pretracheal lymph node measuring 2.4 x 2.1 cm. A second lymph node just anterior the carina measures 1.6 x 1.6 cm. There is a lymph node immediately adjacent to the aortic arch on the left measuring 2.4 x 1.5 cm. There is prominence in the left hilum, felt to represent adenopathy. It is difficult to distinguish lymph node from adjacent vessels in this area without intravenous contrast. There is an enlarged sub- carinal lymph node measuring 2.4 x 1.4 cm.  There is atherosclerotic change in the aorta but no appreciable thoracic aortic aneurysm. No pulmonary embolus is identified on this noncontrast enhanced study. There is  mild pericardial thickening anteriorly. There are no blastic or lytic bone lesions. Thyroid appears normal.  CT abdomen and pelvis: Liver is enlarged measuring 18.2 cm in length. There are metastatic foci throughout the liver. In the medial segment left lobe of the liver, there is a mass measuring 4.3 x 4.9 cm. In the anterior segment of the right lobe, there is a mass measuring 3.9 x 3.6 cm. In the inferior posterior segment of the right lobe of the liver, there is a 3.6 x 3.1 cm mass. There are a few subcentimeter areas of decreased attenuation that must be viewed as nonspecific in the liver. Gallbladder is not seen. Question surgical absence. There is no biliary duct dilatation.  Spleen, pancreas, and adrenals appear within normal limits. Kidneys bilaterally show no mass or hydronephrosis on either side. No renal or1 ureteral calculus is appreciable on either side.  In the pelvis, the urinary bladder is midline with wall thickness within normal limits. There is extensive increase in pelvic vascularity bilaterally in a generalized manner. There is no well-defined pelvic mass. Neither ovary is appreciably enlarged by CT. The appendix region appears normal.  There is  no bowel obstruction. No free air or portal venous air. There is no appreciable ascites, adenopathy, or abscess in the abdomen or pelvis. There are no appreciable omental lesions in the abdomen or pelvis. There is extensive atherosclerotic calcification in the aorta and iliac arteries. No aneurysm is seen. There is degenerative change in the lumbar spine. There are no blastic or lytic bone lesions.  IMPRESSION: CT chest: Multifocal adenopathy. Several nodular lesions concerning for small metastases. There is superior segment left lower lobe airspace consolidation. Underlying emphysema.  CT abdomen and pelvis: Sizable liver metastases. No appreciable adenopathy in the abdomen or pelvis.  Extensive prominence of pelvic vascular structures possibly could have neoplastic etiology but also raises concern for pelvic congestion syndrome.  No bowel obstruction.  No abscess.  Extensive atherosclerotic change.  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 Primary site of carcinoma uncertain given this combination of findings.  These results will be called to the ordering clinician or representative by the Radiologist Assistant, and communication documented in the PACS or zVision Dashboard.   Electronically Signed   By: Lowella Grip M.D.   On: 04/03/2014 13:19   US Abdomen Complete  03/27/2014   CLINICAL DATA:  Initial encounter for abdominal mass in right upper quadrant. Early satiety.  EXAM: ULTRASOUND ABDOMEN COMPLETE  COMPARISON:  None.  FINDINGS: Gallbladder: No gallstones or wall thickening visualized. No sonographic Murphy sign noted.  Common bile duct: Diameter: Normal, 1 mm.  Liver: Multiple hepatic masses are identified. These demonstrate suspicious features, including central heterogeneous hyperechogenicity and a peripheral area of relative hypo echogenicity. Example in the right lobe of the liver at 3.0 x 2.9 x 3.4 cm. A separate more anterior right liver lobe 5.3 x 3.5 x 5.2 cm mass. This corresponds to  the palpable abnormality.  More inferior right liver lobe 3.3 x 2.7 x 3.4 cm lesion.  IVC: No abnormality visualized.  Pancreas: Visualized portion unremarkable.  Spleen: Size and appearance within normal limits.  Right Kidney: Length: 11.0 cm. Echogenicity within normal limits. No mass or hydronephrosis visualized.  Left Kidney: Length: 11.6 cm. Echogenicity within normal limits. No mass or hydronephrosis visualized.  Abdominal aorta: No aneurysm visualized.  Other findings: No ascites.  IMPRESSION: Multiple liver lesions, suspicious for metastatic disease. Recommend further evaluation with post contrast Abdominal pelvic CT in order to more  entirely characterize liver lesions and identify a potential primary.  These results will be called to the ordering clinician or representative by the Radiologist Assistant, and communication documented in the PACS or zVision Dashboard.   Electronically Signed   By: Abigail Miyamoto M.D.   On: 03/27/2014 08:36   Ct Abdomen Pelvis W Contrast  04/03/2014   : CT chest as well as CT abdomen and pelvis studies are combined into a single dictation.   Electronically Signed   By: Lowella Grip M.D.   On: 04/03/2014 13:20   US Biopsy  04/09/2014   CLINICAL DATA:  Lung nodules, thoracic adenopathy, multiple liver lesions suggesting metastatic disease  EXAM: ULTRASOUND-GUIDED CORE LIVER BIOPSY  TECHNIQUE: An ultrasound guided liver biopsy was thoroughly discussed with the patient and questions were answered. The benefits, risks, alternatives, and complications were also discussed. The patient understands and wishes to proceed with the procedure. A verbal as well as written consent was obtained.  Survey ultrasound of the liver was performed, a representative lesion was localized, and an appropriate skin entry site was determined. Skin site was marked, prepped with Betadine, and draped in usual sterile fashion, and infiltrated locally with 1% lidocaine.  Intravenous Fentanyl and Versed  were administered as conscious sedation during continuous cardiorespiratory monitoring by the radiology RN, with a total moderate sedation time of 5 minutes.  A 17 gauge trocar needle was advanced under ultrasound guidance into the liver to the margin of the lesion. 3 coaxial 18gauge core samples were then obtained through the guide needle. The guide needle was removed. Post procedure scans demonstrate no apparent complication.  COMPLICATIONS: COMPLICATIONS None immediate  FINDINGS: Multiple large liver lesions are again identified corresponding to findings on recent CT. Ultrasound-guided core biopsy of a representative lesion was performed without complication.  IMPRESSION: 1. Technically successful ultrasound guided core liver lesion core biopsy.   Electronically Signed   By: Lucrezia Europe M.D.   On: 04/09/2014 15:22   Dg Femur Min 2 Views Left  03/26/2014   CLINICAL DATA:  Groin region pain after fall  EXAM: DG FEMUR 2+V*L*  COMPARISON:  None.  FINDINGS: Frontal and lateral views were obtained. There is no apparent fracture or dislocation. Joint spaces appear intact. No erosive change.  IMPRESSION: No fracture or dislocation.  No appreciable arthropathic change.   Electronically Signed   By: Lowella Grip M.D.   On: 03/26/2014 13:12    ASSESSMENT: This is a very pleasant 64 years old white female recently diagnosed with extensive stage (T1a, N2, M1b) diagnosed in February 2016 and presented with bilateral pulmonary nodules in addition to mediastinal lymphadenopathy and metastatic liver lesions.   PLAN: I had a lengthy discussion with the patient and her son today about her current disease is stage, prognosis and treatment options. The patient has a lot of comorbidities including hypertension dyslipidemia as well as COPD. I recommended for the patient to complete the staging workup by ordering a PET scan as well as MRI of the brain to rule out any other metastatic disease. I explained to the patient  that she has incurable condition and also treatment will be off palliative nature. I discussed with the patient her treatment options including palliative care and hospice referral versus proceeding with palliative chemotherapy with carboplatin for AUC of 5 on day 1 and etoposide 100 MG/M2 on days 1, 2 and 3 with Neulasta support on day 4. I discussed with the patient adverse effect of the chemotherapy including but not  limited to alopecia, myelosuppression, nausea and vomiting, peripheral neuropathy, liver or renal dysfunction. I also explained to the patient that there could be also a risk of this from chemotherapy complications. The patient and her son would like to proceed with the treatment as soon as possible. I will arrange for her to receive the first cycle of this treatment early next week. I will arrange for the patient to have a chemotherapy education class before starting the first dose of her treatment. I will also refer the patient to interventional radiology for a Port-A-Cath placement for IV access. I will call her pharmacy was prescription for Compazine 10 mg by mouth every 6 hours as needed for nausea in addition to Emla cream to be applied to the Port-A-Cath site before treatment. She will come back for follow-up visit in 2 weeks for reevaluation and management of any adverse effect of her treatment. The patient was seen at the multidisciplinary thoracic oncology clinic today by medical oncology, thoracic navigator, social worker as well as physical therapist. She was advised to call immediately if she has any concerning symptoms in the interval.  The patient voices understanding of current disease status and treatment options and is in agreement with the current care plan.  All questions were answered. The patient knows to call the clinic with any problems, questions or concerns. We can certainly see the patient much sooner if necessary.  Thank you so much for allowing me to  participate in the care of MAHROSH DONNELL. I will continue to follow up the patient with you and assist in her care.  I spent 55 minutes counseling the patient face to face. The total time spent in the appointment was 80 minutes.  Disclaimer: This note was dictated with voice recognition software. Similar sounding words can inadvertently be transcribed and may not be corrected upon review.   Jovahn Breit K. April 16, 2014, 4:10 PM

## 2014-04-16 NOTE — Therapy (Signed)
[place], [place], [address] Phone: [phone]   Fax:  [phone]  Physical Therapy Evaluation  Patient Details  Name: Alexa Stephens MRN: [id number] Date of Birth: 02/01/1951 Referring Provider:  Curt Bears, MD  Encounter Date: 04/16/2014      PT End of Session - 04/16/14 1625    Visit Number 1   Number of Visits 1   Date for PT Re-Evaluation 06/14/14   PT Start Time 1500   PT Stop Time 1530   PT Time Calculation (min) 30 min      Past Medical History  Diagnosis Date  . Anxiety disorder   . Asthma   . COPD (chronic obstructive pulmonary disease)     Not on home O2 yet but is prescribed.  . Headache(784.0)   . Hyperlipemia   . Hypertension   . Pneumonia Sept 2013  . Sepsis Sept 2013    Past Surgical History  Procedure Laterality Date  . Cosmetic surgery    . Cystectomy    . Hand surgery      There were no vitals taken for this visit.  Visit Diagnosis:  Debility - Plan: PT plan of care cert/re-cert  Pain in hip joint, left - Plan: PT plan of care cert/re-cert      Subjective Assessment - 04/16/14 1550    Symptoms Pt. presented to physician with c/o groin and abdominal pain.   Pertinent History Scans and biopsy showed small lung nodules, liver mass, and multiple adenopathy; pathology indicated small cell carcinoma.  Pt. is expected to have chemotherapy.  Current smoker and on home O2 at 3L fulltime.  h/o lfet humeral fractures where she discribes jamming her glenohumeral joint; she did not have surgery and still has pain, clicking, and limited function from that; use of her left hand aggravates the shoulder pain.  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                                          Currently in Pain? Other (Comment)  none now, but has pinching pains in right abdomen; left chest pain also.  Describes left groin pain of 1-2  months  duration from twisting her hip; rates that pain up to 9/10; says it is better with right sidelying and worse with moving the left leg certa          [facility] PT Assessment - 04/16/14 0001    Assessment   Medical Diagnosis small cell carcinoma   Precautions   Precautions Other (comment)  cancer precautions   Restrictions   Weight Bearing Restrictions No   Balance Screen   Has the patient fallen in the past 6 months Yes  she describes loss of balance but not full fall   How many times? 2   Has the patient had a decrease in activity level because of a fear of falling?  Yes   Is the patient reluctant to leave their home because of a fear of falling?  Yes   [place] Private residence   Living Arrangements Children   Type of [place] Access Stairs to enter  is slow, with step-to pattern on stairs, uses [place] One level   Prior Function   Level of Independence Needs  assistance with ADLs;Needs assistance with homemaking;Needs assistance with gait   Observation/Other Assessments   Observations small woman with nasal oxygen tube who appears frail   Sensation   Light Touch Not tested  reports some tingling   Posture/Postural Control   Posture/Postural Control Postural limitations   Postural Limitations --  shoulders elevated   AROM   Overall AROM Comments trunk ARAOM mildly limited all directions except extension 50% loss; limited in part by pain from recent liver biopsy   Strength   Overall Strength Comments quick LE strength test shows grossly 4+ strength bilat.; patient does shake in standing so may have limited endurance despite good result on strength test   Ambulation/Gait   Ambulation/Gait Yes   Ambulation/Gait Assistance 6: Modified independent (Device/Increase time)  in [facility] today but says she walks without assistive device;   Ambulation Distance (Feet) --  says she stays in bed a lot, mainly due to breathing, weakne    Balance   Balance Assessed Yes   Dynamic Standing Balance   Dynamic Standing - Balance Activities Forward lean/weight shifting   Dynamic Standing - Comments reaches 11 inches                          PT Education - 04/25/14 1624    Education provided Yes   Education Details posture, breathing, walking, energy conservation   Person(s) Educated Patient;Child(ren)   Methods Explanation;Handout   Comprehension Verbalized understanding               Lung Clinic Goals - 2014-04-25 1632    Patient will be able to verbalize understanding of the benefit of exercise to decrease fatigue.   Status Achieved   Patient will be able to verbalize the importance of posture.   Status Achieved   Patient will be able to demonstrate diaphragmatic breathing for improved lung function.   Status Achieved   Patient will be able to verbalize understanding of the role of physical therapy to prevent functional decline and who to contact if physical therapy is needed.   Status Achieved             Plan - 04-25-14 1625    Clinical Impression Statement Patient with newly diagnosed small cell carcinoma of lung who also has recent history of left groin pain and longer h/o left shoulder pain/fracture.   Pt will benefit from skilled therapeutic intervention in order to improve on the following deficits Pain;Decreased endurance;Decreased strength   Rehab Potential Fair   PT Frequency One time visit   PT Treatment/Interventions Patient/family education   PT Next Visit Plan None at this time; patient may benefit from assessment of left groin pain and left shoulder pain.   Consulted and Agree with Plan of Care Patient          G-Codes - 04-25-14 05/17/1630    Functional Assessment Tool Used clinical judgement   Functional Limitation Other PT primary   Other PT Primary Current Status (F6433) At least 60 percent but less than 80 percent impaired, limited or restricted   Other PT Primary  Goal Status (I9518) At least 60 percent but less than 80 percent impaired, limited or restricted   Other PT Primary Discharge Status [phone]) At least 60 percent but less than 80 percent impaired, limited or restricted       Problem List Patient Active Problem List   Diagnosis Date Noted  . Small cell lung carcinoma 04-25-14  . Liver masses  03/27/2014  . Rapid weight loss 12/25/2013  . Anxiety and depression 09/02/2013  . Tremor 06/11/2013  . Constipation 06/11/2013  . Anemia 06/11/2013  . Migraine headache 04/09/2012  . HTN (hypertension) 11/22/2011  . HLD (hyperlipidemia) 11/22/2011  . Chronic obstructive pulmonary disease (COPD) 11/08/2011  . COPD exacerbation 11/01/2011  . Chronic respiratory failure with hypoxia 11/01/2011  . Tobacco abuse 11/01/2011  . Health care maintenance 11/01/2011    Teirra Carapia 04/16/2014, 4:35 PM  [place], [place], [address] Phone: [phone]   Fax:  [phone]  Serafina Royals, [place]

## 2014-04-17 ENCOUNTER — Telehealth: Payer: Self-pay | Admitting: Internal Medicine

## 2014-04-17 ENCOUNTER — Encounter: Payer: Self-pay | Admitting: *Deleted

## 2014-04-17 NOTE — Telephone Encounter (Signed)
Left message to confirm appointments for Treatment 02/22-02/25

## 2014-04-17 NOTE — Telephone Encounter (Signed)
returned call and s.w pt and confirmed appts....pt ok and aware

## 2014-04-17 NOTE — [facility] Oncology Navigator Note (Unsigned)
   Thoracic Treatment Summary Name:Jesika KASHEENA SAMBRANO Date:04/17/2014 DOB:[birth year] Your Medical Team Medical Oncologist:Dr. [name]  Type and Stage of Lung Cancer  Small Cell Carcinoma:Extensive Stage  Clinical Stage:  Extensive Stage     Clinical stage is based on radiology exams.  Pathological stage will be determined after surgery.  Staging is based on the size of the tumor, involvement of lymph nodes or not, and whether or not the cancer center has spread. Recommendations Recommendations: Chemotherapy   These recommendations are based on information available as of today's consult.  This is subject to change depending further testing or exams. Next Steps Next Step: Medical Oncology will set up follow up appointments  1st chemotherapy 04/20/14 Barriers to Care What do you perceive as a potential barrier that may prevent you from receiving your treatment plan? Education information given and explained about lung cancer Support resources at [facility] given and explained Financial set up to see [employer]  Resources Given: [place] [employer] at [employer].Radonna Ricker [phone]    Questions Norton Blizzard, RN BSN Thoracic Oncology Nurse Navigator at [place] is a nurse navigator that is available to assist you through your cancer journey.  She can answer your questions and/or provide resources regarding your treatment plan, emotional support, or financial concerns.

## 2014-04-18 ENCOUNTER — Other Ambulatory Visit: Payer: Self-pay | Admitting: Family Medicine

## 2014-04-20 ENCOUNTER — Other Ambulatory Visit: Payer: Medicare Other

## 2014-04-20 ENCOUNTER — Other Ambulatory Visit (HOSPITAL_BASED_OUTPATIENT_CLINIC_OR_DEPARTMENT_OTHER): Payer: Medicare Other

## 2014-04-20 ENCOUNTER — Ambulatory Visit (HOSPITAL_BASED_OUTPATIENT_CLINIC_OR_DEPARTMENT_OTHER): Payer: Medicare Other

## 2014-04-20 DIAGNOSIS — C3492 Malignant neoplasm of unspecified part of left bronchus or lung: Secondary | ICD-10-CM

## 2014-04-20 DIAGNOSIS — C7A8 Other malignant neuroendocrine tumors: Secondary | ICD-10-CM

## 2014-04-20 DIAGNOSIS — Z5111 Encounter for antineoplastic chemotherapy: Secondary | ICD-10-CM

## 2014-04-20 LAB — CBC WITH DIFFERENTIAL/PLATELET
BASO%: 0.8 % (ref 0.0–2.0)
BASOS ABS: 0.1 10*3/uL (ref 0.0–0.1)
EOS%: 2.6 % (ref 0.0–7.0)
Eosinophils Absolute: 0.2 10*3/uL (ref 0.0–0.5)
HCT: 34 % — ABNORMAL LOW (ref 34.8–46.6)
HGB: 10.7 g/dL — ABNORMAL LOW (ref 11.6–15.9)
LYMPH%: 21.8 % (ref 14.0–49.7)
MCH: 30.1 pg (ref 25.1–34.0)
MCHC: 31.4 g/dL — ABNORMAL LOW (ref 31.5–36.0)
MCV: 96.1 fL (ref 79.5–101.0)
MONO#: 0.6 10*3/uL (ref 0.1–0.9)
MONO%: 8.2 % (ref 0.0–14.0)
NEUT#: 4.6 10*3/uL (ref 1.5–6.5)
NEUT%: 66.6 % (ref 38.4–76.8)
Platelets: 309 10*3/uL (ref 145–400)
RBC: 3.54 10*6/uL — ABNORMAL LOW (ref 3.70–5.45)
RDW: 14.2 % (ref 11.2–14.5)
WBC: 6.9 10*3/uL (ref 3.9–10.3)
lymph#: 1.5 10*3/uL (ref 0.9–3.3)

## 2014-04-20 LAB — COMPREHENSIVE METABOLIC PANEL (CC13)
ALT: 20 U/L (ref 0–55)
ANION GAP: 9 meq/L (ref 3–11)
AST: 45 U/L — AB (ref 5–34)
Albumin: 3.5 g/dL (ref 3.5–5.0)
Alkaline Phosphatase: 127 U/L (ref 40–150)
BUN: 9.6 mg/dL (ref 7.0–26.0)
CO2: 37 mEq/L — ABNORMAL HIGH (ref 22–29)
Calcium: 9 mg/dL (ref 8.4–10.4)
Chloride: 95 mEq/L — ABNORMAL LOW (ref 98–109)
Creatinine: 0.6 mg/dL (ref 0.6–1.1)
Glucose: 79 mg/dl (ref 70–140)
Potassium: 3.8 mEq/L (ref 3.5–5.1)
Sodium: 142 mEq/L (ref 136–145)
Total Bilirubin: 0.2 mg/dL (ref 0.20–1.20)
Total Protein: 6.8 g/dL (ref 6.4–8.3)

## 2014-04-20 MED ORDER — ONDANSETRON 16 MG/50ML IVPB (CHCC)
INTRAVENOUS | Status: AC
  Filled 2014-04-20: qty 16

## 2014-04-20 MED ORDER — SODIUM CHLORIDE 0.9 % IV SOLN
Freq: Once | INTRAVENOUS | Status: AC
  Administered 2014-04-20: 15:00:00 via INTRAVENOUS

## 2014-04-20 MED ORDER — SODIUM CHLORIDE 0.9 % IV SOLN
309.5000 mg | Freq: Once | INTRAVENOUS | Status: AC
  Administered 2014-04-20: 310 mg via INTRAVENOUS
  Filled 2014-04-20: qty 31

## 2014-04-20 MED ORDER — DEXAMETHASONE SODIUM PHOSPHATE 20 MG/5ML IJ SOLN
INTRAMUSCULAR | Status: AC
  Filled 2014-04-20: qty 5

## 2014-04-20 MED ORDER — DEXAMETHASONE SODIUM PHOSPHATE 20 MG/5ML IJ SOLN
20.0000 mg | Freq: Once | INTRAMUSCULAR | Status: AC
  Administered 2014-04-20: 20 mg via INTRAVENOUS

## 2014-04-20 MED ORDER — ONDANSETRON 16 MG/50ML IVPB (CHCC)
16.0000 mg | Freq: Once | INTRAVENOUS | Status: AC
  Administered 2014-04-20: 16 mg via INTRAVENOUS

## 2014-04-20 MED ORDER — SODIUM CHLORIDE 0.9 % IV SOLN
100.0000 mg/m2 | Freq: Once | INTRAVENOUS | Status: AC
  Administered 2014-04-20: 130 mg via INTRAVENOUS
  Filled 2014-04-20: qty 6.5

## 2014-04-20 NOTE — Patient Instructions (Signed)
Carboplatin injection What is this medicine? CARBOPLATIN (KAR boe pla tin) is a chemotherapy drug. It targets fast dividing cells, like cancer cells, and causes these cells to die. This medicine is used to treat ovarian cancer and many other cancers. This medicine may be used for other purposes; ask your health care provider or pharmacist if you have questions. COMMON BRAND NAME(S): Paraplatin What should I tell my health care provider before I take this medicine? They need to know if you have any of these conditions: -blood disorders -hearing problems -kidney disease -recent or ongoing radiation therapy -an unusual or allergic reaction to carboplatin, cisplatin, other chemotherapy, other medicines, foods, dyes, or preservatives -pregnant or trying to get pregnant -breast-feeding How should I use this medicine? This drug is usually given as an infusion into a vein. It is administered in a hospital or clinic by a specially trained health care professional. Talk to your pediatrician regarding the use of this medicine in children. Special care may be needed. Overdosage: If you think you have taken too much of this medicine contact a poison control center or emergency room at once. NOTE: This medicine is only for you. Do not share this medicine with others. What if I miss a dose? It is important not to miss a dose. Call your doctor or health care professional if you are unable to keep an appointment. What may interact with this medicine? -medicines for seizures -medicines to increase blood counts like filgrastim, pegfilgrastim, sargramostim -some antibiotics like amikacin, gentamicin, neomycin, streptomycin, tobramycin -vaccines Talk to your doctor or health care professional before taking any of these medicines: -acetaminophen -aspirin -ibuprofen -ketoprofen -naproxen This list may not describe all possible interactions. Give your health care provider a list of all the medicines, herbs,  non-prescription drugs, or dietary supplements you use. Also tell them if you smoke, drink alcohol, or use illegal drugs. Some items may interact with your medicine. What should I watch for while using this medicine? Your condition will be monitored carefully while you are receiving this medicine. You will need important blood work done while you are taking this medicine. This drug may make you feel generally unwell. This is not uncommon, as chemotherapy can affect healthy cells as well as cancer cells. Report any side effects. Continue your course of treatment even though you feel ill unless your doctor tells you to stop. In some cases, you may be given additional medicines to help with side effects. Follow all directions for their use. Call your doctor or health care professional for advice if you get a fever, chills or sore throat, or other symptoms of a cold or flu. Do not treat yourself. This drug decreases your body's ability to fight infections. Try to avoid being around people who are sick. This medicine may increase your risk to bruise or bleed. Call your doctor or health care professional if you notice any unusual bleeding. Be careful brushing and flossing your teeth or using a toothpick because you may get an infection or bleed more easily. If you have any dental work done, tell your dentist you are receiving this medicine. Avoid taking products that contain aspirin, acetaminophen, ibuprofen, naproxen, or ketoprofen unless instructed by your doctor. These medicines may hide a fever. Do not become pregnant while taking this medicine. Women should inform their doctor if they wish to become pregnant or think they might be pregnant. There is a potential for serious side effects to an unborn child. Talk to your health care professional or  pharmacist for more information. Do not breast-feed an infant while taking this medicine. What side effects may I notice from receiving this medicine? Side effects  that you should report to your doctor or health care professional as soon as possible: -allergic reactions like skin rash, itching or hives, swelling of the face, lips, or tongue -signs of infection - fever or chills, cough, sore throat, pain or difficulty passing urine -signs of decreased platelets or bleeding - bruising, pinpoint red spots on the skin, black, tarry stools, nosebleeds -signs of decreased red blood cells - unusually weak or tired, fainting spells, lightheadedness -breathing problems -changes in hearing -changes in vision -chest pain -high blood pressure -low blood counts - This drug may decrease the number of white blood cells, red blood cells and platelets. You may be at increased risk for infections and bleeding. -nausea and vomiting -pain, swelling, redness or irritation at the injection site -pain, tingling, numbness in the hands or feet -problems with balance, talking, walking -trouble passing urine or change in the amount of urine Side effects that usually do not require medical attention (report to your doctor or health care professional if they continue or are bothersome): -hair loss -loss of appetite -metallic taste in the mouth or changes in taste This list may not describe all possible side effects. Call your doctor for medical advice about side effects. You may report side effects to FDA at 1-800-FDA-1088. Where should I keep my medicine? This drug is given in a hospital or clinic and will not be stored at home. NOTE: This sheet is a summary. It may not cover all possible information. If you have questions about this medicine, talk to your doctor, pharmacist, or health care provider.  2015, Elsevier/Gold Standard. (2007-05-21 14:38:05)  Etoposide, VP-16 injection What is this medicine? ETOPOSIDE, VP-16 (e toe POE side) is a chemotherapy drug. It is used to treat testicular cancer, lung cancer, and other cancers. This medicine may be used for other purposes;  ask your health care provider or pharmacist if you have questions. COMMON BRAND NAME(S): Etopophos, Toposar, VePesid What should I tell my health care provider before I take this medicine? They need to know if you have any of these conditions: -infection -kidney disease -low blood counts, like low white cell, platelet, or red cell counts -an unusual or allergic reaction to etoposide, other chemotherapeutic agents, other medicines, foods, dyes, or preservatives -pregnant or trying to get pregnant -breast-feeding How should I use this medicine? This medicine is for infusion into a vein. It is administered in a hospital or clinic by a specially trained health care professional. Talk to your pediatrician regarding the use of this medicine in children. Special care may be needed. Overdosage: If you think you have taken too much of this medicine contact a poison control center or emergency room at once. NOTE: This medicine is only for you. Do not share this medicine with others. What if I miss a dose? It is important not to miss your dose. Call your doctor or health care professional if you are unable to keep an appointment. What may interact with this medicine? -cyclosporine -medicines to increase blood counts like filgrastim, pegfilgrastim, sargramostim -vaccines This list may not describe all possible interactions. Give your health care provider a list of all the medicines, herbs, non-prescription drugs, or dietary supplements you use. Also tell them if you smoke, drink alcohol, or use illegal drugs. Some items may interact with your medicine. What should I watch for while using  this medicine? Visit your doctor for checks on your progress. This drug may make you feel generally unwell. This is not uncommon, as chemotherapy can affect healthy cells as well as cancer cells. Report any side effects. Continue your course of treatment even though you feel ill unless your doctor tells you to stop. In  some cases, you may be given additional medicines to help with side effects. Follow all directions for their use. Call your doctor or health care professional for advice if you get a fever, chills or sore throat, or other symptoms of a cold or flu. Do not treat yourself. This drug decreases your body's ability to fight infections. Try to avoid being around people who are sick. This medicine may increase your risk to bruise or bleed. Call your doctor or health care professional if you notice any unusual bleeding. Be careful brushing and flossing your teeth or using a toothpick because you may get an infection or bleed more easily. If you have any dental work done, tell your dentist you are receiving this medicine. Avoid taking products that contain aspirin, acetaminophen, ibuprofen, naproxen, or ketoprofen unless instructed by your doctor. These medicines may hide a fever. Do not become pregnant while taking this medicine. Women should inform their doctor if they wish to become pregnant or think they might be pregnant. There is a potential for serious side effects to an unborn child. Talk to your health care professional or pharmacist for more information. Do not breast-feed an infant while taking this medicine. What side effects may I notice from receiving this medicine? Side effects that you should report to your doctor or health care professional as soon as possible: -allergic reactions like skin rash, itching or hives, swelling of the face, lips, or tongue -low blood counts - this medicine may decrease the number of white blood cells, red blood cells and platelets. You may be at increased risk for infections and bleeding. -signs of infection - fever or chills, cough, sore throat, pain or difficulty passing urine -signs of decreased platelets or bleeding - bruising, pinpoint red spots on the skin, black, tarry stools, blood in the urine -signs of decreased red blood cells - unusually weak or tired,  fainting spells, lightheadedness -breathing problems -changes in vision -mouth or throat sores or ulcers -pain, redness, swelling or irritation at the injection site -pain, tingling, numbness in the hands or feet -redness, blistering, peeling or loosening of the skin, including inside the mouth -seizures -vomiting Side effects that usually do not require medical attention (report to your doctor or health care professional if they continue or are bothersome): -diarrhea -hair loss -loss of appetite -nausea -stomach pain This list may not describe all possible side effects. Call your doctor for medical advice about side effects. You may report side effects to FDA at 1-800-FDA-1088. Where should I keep my medicine? This drug is given in a hospital or clinic and will not be stored at home. NOTE: This sheet is a summary. It may not cover all possible information. If you have questions about this medicine, talk to your doctor, pharmacist, or health care provider.  2015, Elsevier/Gold Standard. (2007-06-17 17:24:12)

## 2014-04-21 ENCOUNTER — Encounter: Payer: Self-pay | Admitting: Adult Health

## 2014-04-21 ENCOUNTER — Encounter: Payer: Self-pay | Admitting: *Deleted

## 2014-04-21 ENCOUNTER — Ambulatory Visit (HOSPITAL_BASED_OUTPATIENT_CLINIC_OR_DEPARTMENT_OTHER): Payer: Medicare Other

## 2014-04-21 ENCOUNTER — Ambulatory Visit (INDEPENDENT_AMBULATORY_CARE_PROVIDER_SITE_OTHER): Payer: Medicare Other | Admitting: Adult Health

## 2014-04-21 ENCOUNTER — Other Ambulatory Visit: Payer: Self-pay | Admitting: *Deleted

## 2014-04-21 ENCOUNTER — Telehealth: Payer: Self-pay | Admitting: Internal Medicine

## 2014-04-21 ENCOUNTER — Telehealth: Payer: Self-pay | Admitting: *Deleted

## 2014-04-21 VITALS — BP 118/64 | HR 91 | Temp 98.1°F | Ht 61.0 in | Wt 100.2 lb

## 2014-04-21 DIAGNOSIS — J441 Chronic obstructive pulmonary disease with (acute) exacerbation: Secondary | ICD-10-CM

## 2014-04-21 DIAGNOSIS — R110 Nausea: Secondary | ICD-10-CM

## 2014-04-21 DIAGNOSIS — C7A8 Other malignant neuroendocrine tumors: Secondary | ICD-10-CM

## 2014-04-21 DIAGNOSIS — C3492 Malignant neoplasm of unspecified part of left bronchus or lung: Secondary | ICD-10-CM

## 2014-04-21 DIAGNOSIS — C3490 Malignant neoplasm of unspecified part of unspecified bronchus or lung: Secondary | ICD-10-CM

## 2014-04-21 DIAGNOSIS — J449 Chronic obstructive pulmonary disease, unspecified: Secondary | ICD-10-CM

## 2014-04-21 DIAGNOSIS — Z5111 Encounter for antineoplastic chemotherapy: Secondary | ICD-10-CM

## 2014-04-21 MED ORDER — SODIUM CHLORIDE 0.9 % IV SOLN
Freq: Once | INTRAVENOUS | Status: AC
  Administered 2014-04-21: 10:00:00 via INTRAVENOUS

## 2014-04-21 MED ORDER — ONDANSETRON 8 MG/50ML IVPB (CHCC)
8.0000 mg | Freq: Once | INTRAVENOUS | Status: AC
  Administered 2014-04-21: 8 mg via INTRAVENOUS

## 2014-04-21 MED ORDER — ALBUTEROL SULFATE HFA 108 (90 BASE) MCG/ACT IN AERS: 2.0000 | INHALATION_SPRAY | RESPIRATORY_TRACT | Status: DC | PRN

## 2014-04-21 MED ORDER — ONDANSETRON HCL 8 MG PO TABS: 8.0000 mg | ORAL_TABLET | Freq: Three times a day (TID) | ORAL | Status: AC | PRN

## 2014-04-21 MED ORDER — PROCHLORPERAZINE MALEATE 10 MG PO TABS
ORAL_TABLET | ORAL | Status: AC
  Filled 2014-04-21: qty 1

## 2014-04-21 MED ORDER — FLUTICASONE FUROATE-VILANTEROL 100-25 MCG/INH IN AEPB: 1.0000 | INHALATION_SPRAY | Freq: Every day | RESPIRATORY_TRACT | Status: DC

## 2014-04-21 MED ORDER — ONDANSETRON 8 MG/NS 50 ML IVPB
INTRAVENOUS | Status: AC
  Filled 2014-04-21: qty 8

## 2014-04-21 MED ORDER — PROCHLORPERAZINE MALEATE 10 MG PO TABS
10.0000 mg | ORAL_TABLET | Freq: Once | ORAL | Status: AC
  Administered 2014-04-21: 10 mg via ORAL

## 2014-04-21 MED ORDER — ONDANSETRON HCL 8 MG PO TABS
ORAL_TABLET | ORAL | Status: AC
  Filled 2014-04-21: qty 1

## 2014-04-21 MED ORDER — SODIUM CHLORIDE 0.9 % IV SOLN
100.0000 mg/m2 | Freq: Once | INTRAVENOUS | Status: AC
  Administered 2014-04-21: 130 mg via INTRAVENOUS
  Filled 2014-04-21: qty 6.5

## 2014-04-21 MED ORDER — ONDANSETRON HCL 8 MG PO TABS: 8.0000 mg | ORAL_TABLET | Freq: Once | ORAL | Status: DC

## 2014-04-21 NOTE — [facility] Oncology Navigator Note (Unsigned)
Emailed radiology dept to see if ordered scans can be scheduled soon.

## 2014-04-21 NOTE — Addendum Note (Signed)
Addended by: Parke Poisson E on: 04/21/2014 05:23 PM   Modules accepted: Orders

## 2014-04-21 NOTE — Telephone Encounter (Signed)
PEr email from MD I have scheduled rest of the appts

## 2014-04-21 NOTE — Progress Notes (Signed)
Patient experiencing nausea here and at home.  Discussed with Dr. [name].  OK to give zofran 8mg  po here and then do a script also. Patient changed her mind and would like it IV instead of po

## 2014-04-21 NOTE — Telephone Encounter (Signed)
s.w pt and she wanted pt appt to go to son....done....put pt son # as main on account

## 2014-04-21 NOTE — Assessment & Plan Note (Signed)
Restart BREO 1 puff daily  Continue on Combivent 1 puff every 6hrs as needed.  Order for Wheelchair and cane to DME .  Continue with follow up with Oncology and treatment as directed Follow up Dr. Halford Chessman  In 6-8 weeks and As needed

## 2014-04-21 NOTE — Patient Instructions (Addendum)
Restart BREO 1 puff daily  Continue on Combivent 1 puff every 6hrs as needed.  Order for Wheelchair and cane to DME .  Continue with follow up with Oncology and treatment as directed Follow up Dr. Halford Chessman  In 6-8 weeks and As needed

## 2014-04-21 NOTE — Progress Notes (Signed)
Reviewed & agree with plan  

## 2014-04-21 NOTE — Progress Notes (Signed)
Subjective:    Patient ID: Alexa Stephens, female    DOB: 29-Jan-1951, 64 y.o.   MRN: [id number]  HPI  64 y.o. female smoker with severe COPD, on chronic O2 and steroids  Spirometry 11/01/11>>FEV1 0.48 (21%), FEV1% 37 (difficult pt effort). A1AT 06/21/12 >> 140, PI-MS  04/21/2014 Follow up : COPD and Metastatic Lung Cancer  Pt returns for a follow up ov.  Recent dx of extensive stage (T1a, N2, M1b) diagnosed small cell lung cancer  She presented with bilateral pulmonary nodules in addition to mediastinal lymphadenopathy and metastatic liver lesions with extensive weight loss.  CT scan of the chest, abdomen and pelvis were performed on 04/03/2014 and it showed fairly extensive adenopathy. There is a pretracheal lymph node measuring 2.4 x 2.1 cm. A second lymph node just anterior the carina measures 1.6 x 1.6 cm. There is a lymph node immediately adjacent to the aortic arch on the left measuring 2.4 x 1.5 cm. There is prominence in the left hilum, felt to represent adenopathy. It is difficult to distinguish lymph node from adjacent vessels in this area without intravenous contrast. There is an enlarged sub- carinal lymph node measuring 2.4 x 1.4 cm. there was also multiple subcentimeter pulmonary nodules bilaterally. Liver is enlarged measuring 18.2 cm in length. There are metastatic foci throughout the liver. In the medial segment left lobe of the liver, there is a mass measuring 4.3 x 4.9 cm. In the anterior segment of the right lobe, there is a mass measuring 3.9 x 3.6 cm. In the inferior posterior segment of the right lobe of the liver, there is a 3.6 x 3.1 cm mass. Liver bx showed  primary lung neuroendocrine carcinoma, small cell type.   She was seen in the [facility] clinic last week , going thru staging process with upcoming PET and MRI brain.  She started on Chemo yesterday- Vepesid.  She says she remains very weak Denies flare of cough or wheezing.  Remains on O2 without increased O2 demands.    We discussed her recent dx of lung cancer , treatment and COPD management .  Support provided.  Son would like a wheelchair and cane order to help her as she is weak and difficult to walk long distances due to weakness and dyspnea. Very unstable with gait.  May need hospital bed at home.  She denies any hemoptysis, orthopnea, PND, or increased leg swelling. Stopped BREO and INCRUSE . Taking combivent Four times a day   We discussed need for BREO .     Review of Systems Constitutional:   No  weight loss, night sweats,  Fevers, chills, + fatigue, or  lassitude.  HEENT:   No headaches,  Difficulty swallowing,  Tooth/dental problems, or  Sore throat,                No sneezing, itching, ear ache, nasal congestion, post nasal drip,   CV:  No chest pain,  Orthopnea, PND, swelling in lower extremities, anasarca, dizziness, palpitations, syncope.   GI  No heartburn, indigestion, abdominal pain, nausea, vomiting, diarrhea, change in bowel habits, loss of appetite, bloody stools.   Resp: .  No chest wall deformity  Skin: no rash or lesions.  GU: no dysuria, change in color of urine, no urgency or frequency.  No flank pain, no hematuria   MS:   No back pain.  Psych:  No change in mood or affect. No depression or anxiety.  No memory loss.  Objective:   Physical Exam GEN: A/Ox3; pleasant  Frail and elderly , chronically ill appearing   HEENT:  [place]/AT,  EACs-clear, TMs-wnl, NOSE-clear, THROAT-clear, no lesions, no postnasal drip or exudate noted.   NECK:  Supple w/ fair ROM; no JVD; normal carotid impulses w/o bruits; no thyromegaly or nodules palpated; no lymphadenopathy.  RESP Decreaed BS in bases .no accessory muscle use, no dullness to percussion  CARD:  RRR, no m/r/g  , no peripheral edema, pulses intact, no cyanosis or clubbing.  GI:   Soft & nt; nml bowel sounds; no organomegaly or masses detected.  Musco: Warm bil, no deformities or joint swelling noted.   Neuro:  alert, no focal deficits noted.    Skin: Warm, no lesions or rashes         Assessment & Plan:

## 2014-04-21 NOTE — Patient Instructions (Addendum)
[facility] Discharge Instructions for Patients Receiving Chemotherapy  Today you received the following chemotherapy agents etoposide.  To help prevent nausea and vomiting after your treatment, we encourage you to take your nausea medication compazine every 6 hrs as needed or you can use the zofran which we just prescribed.  If you develop nausea and vomiting that is not controlled by your nausea medication, call the clinic.   BELOW ARE SYMPTOMS THAT SHOULD BE REPORTED IMMEDIATELY:  *FEVER GREATER THAN 100.5 F  *CHILLS WITH OR WITHOUT FEVER  NAUSEA AND VOMITING THAT IS NOT CONTROLLED WITH YOUR NAUSEA MEDICATION  *UNUSUAL SHORTNESS OF BREATH  *UNUSUAL BRUISING OR BLEEDING  TENDERNESS IN MOUTH AND THROAT WITH OR WITHOUT PRESENCE OF ULCERS  *URINARY PROBLEMS  *BOWEL PROBLEMS  UNUSUAL RASH Items with * indicate a potential emergency and should be followed up as soon as possible.  Feel free to call the clinic you have any questions or concerns. The clinic phone number is [phone].  For constipation you can use senokot-s (you can use generic).  It is a combination laxative and a stool softner . You could also use (laxative) miralax which is a powder that you put in water and then drink it.

## 2014-04-21 NOTE — Progress Notes (Signed)
Reviewed and agree with assessment/plan. 

## 2014-04-21 NOTE — Assessment & Plan Note (Signed)
Cont follow up with oncology with tx course

## 2014-04-22 ENCOUNTER — Ambulatory Visit (HOSPITAL_BASED_OUTPATIENT_CLINIC_OR_DEPARTMENT_OTHER): Payer: Medicare Other | Admitting: Nurse Practitioner

## 2014-04-22 ENCOUNTER — Encounter: Payer: Self-pay | Admitting: Nurse Practitioner

## 2014-04-22 ENCOUNTER — Encounter: Payer: Self-pay | Admitting: *Deleted

## 2014-04-22 ENCOUNTER — Ambulatory Visit (HOSPITAL_BASED_OUTPATIENT_CLINIC_OR_DEPARTMENT_OTHER): Payer: Medicare Other

## 2014-04-22 ENCOUNTER — Other Ambulatory Visit: Payer: Self-pay | Admitting: Radiology

## 2014-04-22 ENCOUNTER — Other Ambulatory Visit: Payer: Medicare Other

## 2014-04-22 VITALS — BP 154/78 | HR 101 | Temp 98.3°F | Resp 20 | Wt 97.9 lb

## 2014-04-22 DIAGNOSIS — J0140 Acute pansinusitis, unspecified: Secondary | ICD-10-CM

## 2014-04-22 DIAGNOSIS — R0981 Nasal congestion: Secondary | ICD-10-CM

## 2014-04-22 DIAGNOSIS — Z5111 Encounter for antineoplastic chemotherapy: Secondary | ICD-10-CM

## 2014-04-22 DIAGNOSIS — C3490 Malignant neoplasm of unspecified part of unspecified bronchus or lung: Secondary | ICD-10-CM

## 2014-04-22 DIAGNOSIS — J329 Chronic sinusitis, unspecified: Secondary | ICD-10-CM | POA: Insufficient documentation

## 2014-04-22 DIAGNOSIS — C7A8 Other malignant neuroendocrine tumors: Secondary | ICD-10-CM

## 2014-04-22 DIAGNOSIS — C7B8 Other secondary neuroendocrine tumors: Secondary | ICD-10-CM

## 2014-04-22 DIAGNOSIS — C3492 Malignant neoplasm of unspecified part of left bronchus or lung: Secondary | ICD-10-CM

## 2014-04-22 DIAGNOSIS — R05 Cough: Secondary | ICD-10-CM

## 2014-04-22 MED ORDER — PROCHLORPERAZINE MALEATE 10 MG PO TABS
10.0000 mg | ORAL_TABLET | Freq: Once | ORAL | Status: DC
  Administered 2014-04-22: 10 mg via ORAL

## 2014-04-22 MED ORDER — MORPHINE SULFATE 4 MG/ML IJ SOLN
2.0000 mg | Freq: Once | INTRAMUSCULAR | Status: AC
  Administered 2014-04-22: 2 mg via INTRAVENOUS

## 2014-04-22 MED ORDER — ONDANSETRON 8 MG/50ML IVPB (CHCC)
8.0000 mg | Freq: Once | INTRAVENOUS | Status: AC
  Administered 2014-04-22: 8 mg via INTRAVENOUS

## 2014-04-22 MED ORDER — ONDANSETRON 8 MG/NS 50 ML IVPB
INTRAVENOUS | Status: AC
  Filled 2014-04-22: qty 8

## 2014-04-22 MED ORDER — HYDROCODONE-HOMATROPINE 5-1.5 MG/5ML PO SYRP: 5.0000 mL | ORAL_SOLUTION | Freq: Four times a day (QID) | ORAL | Status: DC | PRN

## 2014-04-22 MED ORDER — PROCHLORPERAZINE MALEATE 10 MG PO TABS
ORAL_TABLET | ORAL | Status: AC
  Filled 2014-04-22: qty 1

## 2014-04-22 MED ORDER — SODIUM CHLORIDE 0.9 % IV SOLN
100.0000 mg/m2 | Freq: Once | INTRAVENOUS | Status: AC
  Administered 2014-04-22: 130 mg via INTRAVENOUS
  Filled 2014-04-22: qty 6.5

## 2014-04-22 MED ORDER — AZITHROMYCIN 250 MG PO TABS: ORAL_TABLET | ORAL | Status: DC

## 2014-04-22 MED ORDER — SODIUM CHLORIDE 0.9 % IV SOLN
Freq: Once | INTRAVENOUS | Status: AC
  Administered 2014-04-22: 15:00:00 via INTRAVENOUS

## 2014-04-22 MED ORDER — MORPHINE SULFATE 4 MG/ML IJ SOLN
INTRAMUSCULAR | Status: AC
  Filled 2014-04-22: qty 1

## 2014-04-22 NOTE — Assessment & Plan Note (Addendum)
Patient recently diagnosed with lung cancer, with metastasis to the liver.  She initiated cycle 1, day 1 of her carboplatin/etoposide chemotherapy on 04/20/2014.  She presented to the [facility] today to receive cycle 1, day 3 of etoposide only portion of her chemotherapy.  She will return tomorrow for her Neulasta injection.  Patient is scheduled for both a Port-A-Cath placement in a brain MRI on Friday, 04/24/2014.  She is scheduled for a PET scan on 3/29 2016.  She'll also return to the [facility] for labs and a follow-up visit on 3/29 2016 as well.

## 2014-04-22 NOTE — Progress Notes (Signed)
SYMPTOM MANAGEMENT CLINIC   HPI: Alexa Stephens 64 y.o. female diagnosed with lung cancer; with liver metastasis.  Currently undergoing carboplatin/etoposide chemotherapy regimen.  Patient presented to the [facility] today to receive cycle 1, day 3 of etoposide only portion of her chemotherapy regimen.  She is complaining of recent onset significant sinus congestion, sinus drainage, congested cough, headache, facial tenderness, and bilateral ear fullness.  She denies any recent fevers or chills.  Also, patient has a history of chronic tremor as her baseline.  She uses home O2 on a regular basis.   HPI  ROS  Past Medical History  Diagnosis Date  . Anxiety disorder   . Asthma   . COPD (chronic obstructive pulmonary disease)     Not on home O2 yet but is prescribed.  . Headache(784.0)   . Hyperlipemia   . Hypertension   . Pneumonia Sept 2013  . Sepsis Sept 2013    Past Surgical History  Procedure Laterality Date  . Cosmetic surgery    . Cystectomy    . Hand surgery      has COPD exacerbation; Chronic respiratory failure with hypoxia; Tobacco abuse; Health care maintenance; HTN (hypertension); HLD (hyperlipidemia); Chronic obstructive pulmonary disease (COPD); Migraine headache; Tremor; Constipation; Anemia; Anxiety and depression; Rapid weight loss; Liver masses; Small cell lung carcinoma; and Sinusitis on her problem list.    is allergic to compazine; excedrin extra strength; flexeril; lexapro; naproxen; skelaxin; and tylenol.    Medication List       This list is accurate as of: 04/22/14  6:08 PM.  Always use your most recent med list.               albuterol (2.5 MG/3ML) 0.083% nebulizer solution  Commonly known as:  PROVENTIL  Inhale 3 mLs into the lungs every 6 (six) hours as needed for wheezing or shortness of breath.     albuterol 108 (90 BASE) MCG/ACT inhaler  Commonly known as:  PROVENTIL HFA;VENTOLIN HFA  Inhale 2 puffs into the lungs every 4 (four)  hours as needed. For shortness of breath.     azithromycin 250 MG tablet  Commonly known as:  ZITHROMAX Z-PAK  Take 2 tabs (500 mg) PO on day # 1; then take 1 tab (250 mg) PO QD till gone.     calcium-vitamin D 500-200 MG-UNIT per tablet  Take 1 tablet by mouth daily. When remembers     clonazePAM 1 MG tablet  Commonly known as:  KLONOPIN  Take 0.5 mg by mouth 2 (two) times daily as needed for anxiety.     clonazePAM 1 MG tablet  Commonly known as:  KLONOPIN  TAKE 1 TABLET BY MOUTH TWICE DAILY AS NEEDED FOR ANXIETY     Ipratropium-Albuterol 20-100 MCG/ACT Aers respimat  Commonly known as:  COMBIVENT  Inhale 1 puff into the lungs every 6 (six) hours.     COMBIVENT RESPIMAT 20-100 MCG/ACT Aers respimat  Generic drug:  Ipratropium-Albuterol  INHALE 1 PUFF BY MOUTH EVERY 6 HOURS     Fluticasone Furoate-Vilanterol 100-25 MCG/INH Aepb  Commonly known as:  BREO ELLIPTA  Inhale 1 puff into the lungs daily.     HYDROcodone-acetaminophen 10-325 MG per tablet  Commonly known as:  NORCO  Take 1 tablet by mouth every 6 (six) hours as needed (For pain.).     HYDROcodone-homatropine 5-1.5 MG/5ML syrup  Commonly known as:  HYCODAN  Take 5 mLs by mouth every 6 (six) hours as needed for  cough.     ipratropium 0.02 % nebulizer solution  Commonly known as:  ATROVENT  Take 2.5 mLs (500 mcg total) by nebulization every 6 (six) hours as needed (ICD10: J44.9).     lidocaine-prilocaine cream  Commonly known as:  EMLA  Apply 1 application topically as needed.     lisinopril 20 MG tablet  Commonly known as:  PRINIVIL,ZESTRIL  Take 1 tablet (20 mg total) by mouth daily.     multivitamin with minerals Tabs tablet  Take 1 tablet by mouth daily.     ondansetron 8 MG tablet  Commonly known as:  ZOFRAN  Take 1 tablet (8 mg total) by mouth every 8 (eight) hours as needed for nausea or vomiting.     polyethylene glycol packet  Commonly known as:  MIRALAX / GLYCOLAX  Take 17 g by mouth daily.       prochlorperazine 10 MG tablet  Commonly known as:  COMPAZINE  Take by mouth. Take as directed     ranitidine 150 MG tablet  Commonly known as:  ZANTAC  Take 150 mg by mouth 2 (two) times daily.     rizatriptan 5 MG disintegrating tablet  Commonly known as:  MAXALT-MLT  Take 5 mg by mouth as needed for migraine. May repeat in 2 hours if needed     simvastatin 20 MG tablet  Commonly known as:  ZOCOR  Take 20 mg by mouth daily.         PHYSICAL EXAMINATION  Blood pressure 154/78, pulse 101, temperature 98.3 F (36.8 C), temperature source Oral, resp. rate 20, weight 97 lb 14.4 oz (44.407 kg), SpO2 100 %.  Physical Exam  Constitutional: She is oriented to person, place, and time. Vital signs are normal. She appears malnourished. She appears unhealthy. She appears cachectic.  Patient appears very frail, fatigued, chronically ill and weak.  HENT:  Head: Normocephalic and atraumatic.  Mouth/Throat: Oropharynx is clear and moist.  Nasal congestion noted on exam today.  Oropharynx clear with no erythema or exudate.  Bilateral TMs intact with no effusion.  Positive for facial tenderness to both the frontal and maxillary sinuses with palpation.  Eyes: Conjunctivae and EOM are normal. Pupils are equal, round, and reactive to light. Right eye exhibits no discharge. Left eye exhibits no discharge. No scleral icterus.  Neck: Normal range of motion. Neck supple. No JVD present. No tracheal deviation present. No thyromegaly present.  Cardiovascular: Normal rate, regular rhythm, normal heart sounds and intact distal pulses.   Pulmonary/Chest: Effort normal. No respiratory distress. She has no wheezes. She has no rales. She exhibits no tenderness.  Patient with slightly diminished breath sounds bilaterally; but no cough or wheezing noted.  Abdominal: Soft. Bowel sounds are normal. She exhibits no distension and no mass. There is no tenderness. There is no rebound and no guarding.   Musculoskeletal: Normal range of motion. She exhibits no edema or tenderness.  Lymphadenopathy:    She has no cervical adenopathy.  Neurological: She is alert and oriented to person, place, and time. Gait normal.  Skin: Skin is warm and dry. No rash noted. No erythema. There is pallor.  Psychiatric: Affect normal.  Nursing note and vitals reviewed.   LABORATORY DATA:. Appointment on 04/20/2014  Component Date Value Ref Range Status  . WBC 04/20/2014 6.9  3.9 - 10.3 10e3/uL Final  . NEUT# 04/20/2014 4.6  1.5 - 6.5 10e3/uL Final  . HGB 04/20/2014 10.7* 11.6 - 15.9 g/dL Final  . HCT 04/20/2014  34.0* 34.8 - 46.6 % Final  . Platelets 04/20/2014 309  145 - 400 10e3/uL Final  . MCV 04/20/2014 96.1  79.5 - 101.0 fL Final  . MCH 04/20/2014 30.1  25.1 - 34.0 pg Final  . MCHC 04/20/2014 31.4* 31.5 - 36.0 g/dL Final  . RBC 04/20/2014 3.54* 3.70 - 5.45 10e6/uL Final  . RDW 04/20/2014 14.2  11.2 - 14.5 % Final  . lymph# 04/20/2014 1.5  0.9 - 3.3 10e3/uL Final  . MONO# 04/20/2014 0.6  0.1 - 0.9 10e3/uL Final  . Eosinophils Absolute 04/20/2014 0.2  0.0 - 0.5 10e3/uL Final  . Basophils Absolute 04/20/2014 0.1  0.0 - 0.1 10e3/uL Final  . NEUT% 04/20/2014 66.6  38.4 - 76.8 % Final  . LYMPH% 04/20/2014 21.8  14.0 - 49.7 % Final  . MONO% 04/20/2014 8.2  0.0 - 14.0 % Final  . EOS% 04/20/2014 2.6  0.0 - 7.0 % Final  . BASO% 04/20/2014 0.8  0.0 - 2.0 % Final  . Sodium 04/20/2014 142  136 - 145 mEq/L Final  . Potassium 04/20/2014 3.8  3.5 - 5.1 mEq/L Final  . Chloride 04/20/2014 95* 98 - 109 mEq/L Final  . CO2 04/20/2014 37* 22 - 29 mEq/L Final  . Glucose 04/20/2014 79  70 - 140 mg/dl Final  . BUN 04/20/2014 9.6  7.0 - 26.0 mg/dL Final  . Creatinine 04/20/2014 0.6  0.6 - 1.1 mg/dL Final  . Total Bilirubin 04/20/2014 0.20  0.20 - 1.20 mg/dL Final  . Alkaline Phosphatase 04/20/2014 127  40 - 150 U/L Final  . AST 04/20/2014 45* 5 - 34 U/L Final  . ALT 04/20/2014 20  0 - 55 U/L Final  . Total Protein  04/20/2014 6.8  6.4 - 8.3 g/dL Final  . Albumin 04/20/2014 3.5  3.5 - 5.0 g/dL Final  . Calcium 04/20/2014 9.0  8.4 - 10.4 mg/dL Final  . Anion Gap 04/20/2014 9  3 - 11 mEq/L Final  . EGFR 04/20/2014 >90  >90 ml/min/1.73 m2 Final   eGFR is calculated using the CKD-EPI Creatinine Equation (2009)     RADIOGRAPHIC STUDIES: No results found.  ASSESSMENT/PLAN:    Sinusitis Patient complaining of onset within the past few days of sinus congestion, sinus drainage, cough with yellow/green secretions, headache, and facial tenderness.  She's also complaining of some bilateral ear fullness as well.  She denies any recent fevers or chills.    On exam-patient with occasional congested cough.  Bilateral breath sounds essentially clear; with no wheezes.  Nasal congestion noted; but throat with no erythema or exudate.  Bilateral TMs clear with no effusions.  Patient does have some significant facial tenderness to both her frontal and maxillary sinuses with palpation.  Will treat patient for suspected sinusitis with Zithromax.  Patient also requested a cough syrup to help her sleep at night.  Patient was given a prescription for Hycodan cough syrup.   Small cell lung carcinoma Patient recently diagnosed with lung cancer, with metastasis to the liver.  She initiated cycle 1, day 1 of her carboplatin/etoposide chemotherapy on 04/20/2014.  She presented to the [facility] today to receive cycle 1, day 3 of etoposide only portion of her chemotherapy.  She will return tomorrow for her Neulasta injection.  Patient is scheduled for both a Port-A-Cath placement in a brain MRI on Friday, 04/24/2014.  She is scheduled for a PET scan on 3/29 2016.  She'll also return to the [facility] for labs and a follow-up  visit on 3/29 2016 as well.   Patient stated understanding of all instructions; and was in agreement with this plan of care. The patient knows to call the clinic with any problems, questions or concerns.    Review/collaboration with Dr. [name] regarding all aspects of patient's visit today.   Total time spent with patient was 25 minutes;  with greater than 75 percent of that time spent in face to face counseling regarding patient's symptoms,  and coordination of care and follow up.  Disclaimer: This note was dictated with voice recognition software. Similar sounding words can inadvertently be transcribed and may not be corrected upon review.   Drue Second, NP 04/22/2014

## 2014-04-22 NOTE — Patient Instructions (Signed)
[facility] Discharge Instructions for Patients Receiving Chemotherapy  Today you received the following chemotherapy agent: Etoposide   To help prevent nausea and vomiting after your treatment, we encourage you to take your nausea medication as prescribed.    If you develop nausea and vomiting that is not controlled by your nausea medication, call the clinic.   BELOW ARE SYMPTOMS THAT SHOULD BE REPORTED IMMEDIATELY:  *FEVER GREATER THAN 100.5 F  *CHILLS WITH OR WITHOUT FEVER  NAUSEA AND VOMITING THAT IS NOT CONTROLLED WITH YOUR NAUSEA MEDICATION  *UNUSUAL SHORTNESS OF BREATH  *UNUSUAL BRUISING OR BLEEDING  TENDERNESS IN MOUTH AND THROAT WITH OR WITHOUT PRESENCE OF ULCERS  *URINARY PROBLEMS  *BOWEL PROBLEMS  UNUSUAL RASH Items with * indicate a potential emergency and should be followed up as soon as possible.  Feel free to call the clinic you have any questions or concerns. The clinic phone number is [phone].

## 2014-04-22 NOTE — Assessment & Plan Note (Signed)
Patient complaining of onset within the past few days of sinus congestion, sinus drainage, cough with yellow/green secretions, headache, and facial tenderness.  She's also complaining of some bilateral ear fullness as well.  She denies any recent fevers or chills.    On exam-patient with occasional congested cough.  Bilateral breath sounds essentially clear; with no wheezes.  Nasal congestion noted; but throat with no erythema or exudate.  Bilateral TMs clear with no effusions.  Patient does have some significant facial tenderness to both her frontal and maxillary sinuses with palpation.  Will treat patient for suspected sinusitis with Zithromax.  Patient also requested a cough syrup to help her sleep at night.  Patient was given a prescription for Hycodan cough syrup.

## 2014-04-23 ENCOUNTER — Other Ambulatory Visit: Payer: Self-pay | Admitting: Radiology

## 2014-04-23 ENCOUNTER — Ambulatory Visit (HOSPITAL_BASED_OUTPATIENT_CLINIC_OR_DEPARTMENT_OTHER): Payer: Medicare Other

## 2014-04-23 ENCOUNTER — Telehealth: Payer: Self-pay

## 2014-04-23 DIAGNOSIS — C7A8 Other malignant neuroendocrine tumors: Secondary | ICD-10-CM

## 2014-04-23 DIAGNOSIS — C7B8 Other secondary neuroendocrine tumors: Secondary | ICD-10-CM

## 2014-04-23 DIAGNOSIS — C3492 Malignant neoplasm of unspecified part of left bronchus or lung: Secondary | ICD-10-CM

## 2014-04-23 DIAGNOSIS — Z5189 Encounter for other specified aftercare: Secondary | ICD-10-CM

## 2014-04-23 MED ORDER — PEGFILGRASTIM INJECTION 6 MG/0.6ML ~~LOC~~
6.0000 mg | PREFILLED_SYRINGE | Freq: Once | SUBCUTANEOUS | Status: AC
  Administered 2014-04-23: 6 mg via SUBCUTANEOUS

## 2014-04-23 NOTE — Telephone Encounter (Signed)
S/w son [name]. He feels she is doing better. He stated they did not have hycodan at the pharmacy. When clarified this, he realized he had the printed copy in the pt's paperwork. They are coming to [place] for neulasta and will fill it then.

## 2014-04-23 NOTE — Patient Instructions (Signed)
Pegfilgrastim injection What is this medicine? PEGFILGRASTIM (peg fil GRA stim) is a long-acting granulocyte colony-stimulating factor that stimulates the growth of neutrophils, a type of white blood cell important in the body's fight against infection. It is used to reduce the incidence of fever and infection in patients with certain types of cancer who are receiving chemotherapy that affects the bone marrow. This medicine may be used for other purposes; ask your health care provider or pharmacist if you have questions. COMMON BRAND NAME(S): Neulasta What should I tell my health care provider before I take this medicine? They need to know if you have any of these conditions: -latex allergy -ongoing radiation therapy -sickle cell disease -skin reactions to acrylic adhesives (On-Body Injector only) -an unusual or allergic reaction to pegfilgrastim, filgrastim, other medicines, foods, dyes, or preservatives -pregnant or trying to get pregnant -breast-feeding How should I use this medicine? This medicine is for injection under the skin. If you get this medicine at home, you will be taught how to prepare and give the pre-filled syringe or how to use the On-body Injector. Refer to the patient Instructions for Use for detailed instructions. Use exactly as directed. Take your medicine at regular intervals. Do not take your medicine more often than directed. It is important that you put your used needles and syringes in a special sharps container. Do not put them in a trash can. If you do not have a sharps container, call your pharmacist or healthcare provider to get one. Talk to your pediatrician regarding the use of this medicine in children. Special care may be needed. Overdosage: If you think you have taken too much of this medicine contact a poison control center or emergency room at once. NOTE: This medicine is only for you. Do not share this medicine with others. What if I miss a dose? It is  important not to miss your dose. Call your doctor or health care professional if you miss your dose. If you miss a dose due to an On-body Injector failure or leakage, a new dose should be administered as soon as possible using a single prefilled syringe for manual use. What may interact with this medicine? Interactions have not been studied. Give your health care provider a list of all the medicines, herbs, non-prescription drugs, or dietary supplements you use. Also tell them if you smoke, drink alcohol, or use illegal drugs. Some items may interact with your medicine. This list may not describe all possible interactions. Give your health care provider a list of all the medicines, herbs, non-prescription drugs, or dietary supplements you use. Also tell them if you smoke, drink alcohol, or use illegal drugs. Some items may interact with your medicine. What should I watch for while using this medicine? You may need blood work done while you are taking this medicine. If you are going to need a MRI, CT scan, or other procedure, tell your doctor that you are using this medicine (On-Body Injector only). What side effects may I notice from receiving this medicine? Side effects that you should report to your doctor or health care professional as soon as possible: -allergic reactions like skin rash, itching or hives, swelling of the face, lips, or tongue -dizziness -fever -pain, redness, or irritation at site where injected -pinpoint red spots on the skin -shortness of breath or breathing problems -stomach or side pain, or pain at the shoulder -swelling -tiredness -trouble passing urine Side effects that usually do not require medical attention (report to your doctor   or health care professional if they continue or are bothersome): -bone pain -muscle pain This list may not describe all possible side effects. Call your doctor for medical advice about side effects. You may report side effects to FDA at  1-800-FDA-1088. Where should I keep my medicine? Keep out of the reach of children. Store pre-filled syringes in a refrigerator between 2 and 8 degrees C (36 and 46 degrees F). Do not freeze. Keep in carton to protect from light. Throw away this medicine if it is left out of the refrigerator for more than 48 hours. Throw away any unused medicine after the expiration date. NOTE: This sheet is a summary. It may not cover all possible information. If you have questions about this medicine, talk to your doctor, pharmacist, or health care provider.  2015, [employer]vier/[employer] Standard. (2013-05-15 16:14:05)  

## 2014-04-24 ENCOUNTER — Encounter (HOSPITAL_COMMUNITY): Payer: Self-pay

## 2014-04-24 ENCOUNTER — Other Ambulatory Visit: Payer: Self-pay | Admitting: Internal Medicine

## 2014-04-24 ENCOUNTER — Ambulatory Visit (HOSPITAL_COMMUNITY)
Admission: RE | Admit: 2014-04-24 | Discharge: 2014-04-24 | Disposition: A | Discharge status: Home / Self Care | Payer: Medicare Other | Source: Ambulatory Visit | Attending: Internal Medicine | Admitting: Internal Medicine

## 2014-04-24 ENCOUNTER — Telehealth: Payer: Self-pay | Admitting: Adult Health

## 2014-04-24 DIAGNOSIS — C7931 Secondary malignant neoplasm of brain: Secondary | ICD-10-CM | POA: Diagnosis not present

## 2014-04-24 DIAGNOSIS — J449 Chronic obstructive pulmonary disease, unspecified: Secondary | ICD-10-CM

## 2014-04-24 DIAGNOSIS — C3492 Malignant neoplasm of unspecified part of left bronchus or lung: Secondary | ICD-10-CM

## 2014-04-24 DIAGNOSIS — J322 Chronic ethmoidal sinusitis: Secondary | ICD-10-CM | POA: Diagnosis not present

## 2014-04-24 DIAGNOSIS — C3490 Malignant neoplasm of unspecified part of unspecified bronchus or lung: Secondary | ICD-10-CM

## 2014-04-24 DIAGNOSIS — R9082 White matter disease, unspecified: Secondary | ICD-10-CM | POA: Insufficient documentation

## 2014-04-24 HISTORY — DX: Malignant (primary) neoplasm, unspecified: C80.1

## 2014-04-24 LAB — CBC WITH DIFFERENTIAL/PLATELET
BASOS ABS: 0 10*3/uL (ref 0.0–0.1)
Basophils Relative: 0 % (ref 0–1)
EOS PCT: 1 % (ref 0–5)
Eosinophils Absolute: 0.3 10*3/uL (ref 0.0–0.7)
HCT: 35.5 % — ABNORMAL LOW (ref 36.0–46.0)
Hemoglobin: 11 g/dL — ABNORMAL LOW (ref 12.0–15.0)
LYMPHS ABS: 2 10*3/uL (ref 0.7–4.0)
Lymphocytes Relative: 7 % — ABNORMAL LOW (ref 12–46)
MCH: 30.5 pg (ref 26.0–34.0)
MCHC: 31 g/dL (ref 30.0–36.0)
MCV: 98.3 fL (ref 78.0–100.0)
MONO ABS: 0.3 10*3/uL (ref 0.1–1.0)
Monocytes Relative: 1 % — ABNORMAL LOW (ref 3–12)
Neutro Abs: 26.1 10*3/uL — ABNORMAL HIGH (ref 1.7–7.7)
Neutrophils Relative %: 91 % — ABNORMAL HIGH (ref 43–77)
PLATELETS: 291 10*3/uL (ref 150–400)
RBC: 3.61 MIL/uL — ABNORMAL LOW (ref 3.87–5.11)
RDW: 14.2 % (ref 11.5–15.5)
WBC: 28.7 10*3/uL — ABNORMAL HIGH (ref 4.0–10.5)

## 2014-04-24 LAB — PROTIME-INR
INR: 0.88 (ref 0.00–1.49)
Prothrombin Time: 12.1 seconds (ref 11.6–15.2)

## 2014-04-24 MED ORDER — SODIUM CHLORIDE 0.9 % IV SOLN
INTRAVENOUS | Status: DC
  Administered 2014-04-24: 14:00:00 via INTRAVENOUS

## 2014-04-24 MED ORDER — LIDOCAINE HCL 1 % IJ SOLN
INTRAMUSCULAR | Status: AC
  Filled 2014-04-24: qty 20

## 2014-04-24 MED ORDER — GADOBENATE DIMEGLUMINE 529 MG/ML IV SOLN
10.0000 mL | Freq: Once | INTRAVENOUS | Status: AC | PRN
  Administered 2014-04-24: 8 mL via INTRAVENOUS

## 2014-04-24 MED ORDER — FENTANYL CITRATE 0.05 MG/ML IJ SOLN
INTRAMUSCULAR | Status: AC | PRN
Start: 2014-04-24 — End: 2014-04-24
  Administered 2014-04-24 (×2): 50 ug via INTRAVENOUS

## 2014-04-24 MED ORDER — MIDAZOLAM HCL 2 MG/2ML IJ SOLN
INTRAMUSCULAR | Status: AC | PRN
  Administered 2014-04-24: 0.5 mg via INTRAVENOUS
  Administered 2014-04-24: 1 mg via INTRAVENOUS
  Administered 2014-04-24: 0.5 mg via INTRAVENOUS

## 2014-04-24 MED ORDER — HEPARIN SOD (PORK) LOCK FLUSH 100 UNIT/ML IV SOLN
INTRAVENOUS | Status: AC | PRN
  Administered 2014-04-24: 500 [IU]

## 2014-04-24 MED ORDER — HEPARIN SOD (PORK) LOCK FLUSH 100 UNIT/ML IV SOLN
INTRAVENOUS | Status: AC
  Filled 2014-04-24: qty 5

## 2014-04-24 MED ORDER — LIDOCAINE-EPINEPHRINE 2 %-1:100000 IJ SOLN
INTRAMUSCULAR | Status: AC
  Filled 2014-04-24: qty 1

## 2014-04-24 MED ORDER — FENTANYL CITRATE 0.05 MG/ML IJ SOLN
INTRAMUSCULAR | Status: AC
  Filled 2014-04-24: qty 4

## 2014-04-24 MED ORDER — CEFAZOLIN SODIUM-DEXTROSE 2-3 GM-% IV SOLR
INTRAVENOUS | Status: AC
  Filled 2014-04-24: qty 50

## 2014-04-24 MED ORDER — MIDAZOLAM HCL 2 MG/2ML IJ SOLN
INTRAMUSCULAR | Status: AC
  Filled 2014-04-24: qty 6

## 2014-04-24 MED ORDER — CEFAZOLIN SODIUM-DEXTROSE 2-3 GM-% IV SOLR
2.0000 g | INTRAVENOUS | Status: AC
  Administered 2014-04-24: 2 g via INTRAVENOUS

## 2014-04-24 NOTE — H&P (Signed)
Chief Complaint: "I'm here for a port a cath"  Referring Physician(s): Mohamed,Mohamed  History of Present Illness: Alexa Stephens is a 64 y.o. female with history of recently diagnosed metastatic lung carcinoma who presents today for port a cath placement for chemotherapy.   Past Medical History  Diagnosis Date  . Anxiety disorder   . Asthma   . COPD (chronic obstructive pulmonary disease)     Not on home O2 yet but is prescribed.  . Headache(784.0)   . Hyperlipemia   . Hypertension   . Pneumonia Sept 2013  . Sepsis Sept 2013  . Cancer     lung cancer     Past Surgical History  Procedure Laterality Date  . Cosmetic surgery    . Cystectomy    . Hand surgery      Allergies: Benadryl; Compazine; Excedrin extra strength; Flexeril; Lexapro; Naproxen; Skelaxin; and Tylenol  Medications: Prior to Admission medications   Medication Sig Start Date End Date Taking? Authorizing Provider  albuterol (PROVENTIL HFA;VENTOLIN HFA) 108 (90 BASE) MCG/ACT inhaler Inhale 2 puffs into the lungs every 4 (four) hours as needed. For shortness of breath. 04/21/14  Yes Tammy S Parrett, NP  albuterol (PROVENTIL) (2.5 MG/3ML) 0.083% nebulizer solution Inhale 3 mLs into the lungs every 6 (six) hours as needed for wheezing or shortness of breath.  01/07/14  Yes Historical Provider, MD  azithromycin (ZITHROMAX Z-PAK) 250 MG tablet Take 2 tabs (500 mg) PO on day # 1; then take 1 tab (250 mg) PO QD till gone. 04/22/14  Yes Drue Second, NP  Calcium Carbonate-Vitamin D (CALCIUM-VITAMIN D) 500-200 MG-UNIT per tablet Take 1 tablet by mouth daily. When remembers   Yes Historical Provider, MD  clonazePAM (KLONOPIN) 1 MG tablet Take 0.5 mg by mouth 2 (two) times daily as needed for anxiety.   Yes Historical Provider, MD  Fluticasone Furoate-Vilanterol (BREO ELLIPTA) 100-25 MCG/INH AEPB Inhale 1 puff into the lungs daily. 04/21/14  Yes Tammy S Parrett, NP  HYDROcodone-homatropine (HYCODAN) 5-1.5 MG/5ML syrup  Take 5 mLs by mouth every 6 (six) hours as needed for cough. 04/22/14  Yes Drue Second, NP  ipratropium (ATROVENT) 0.02 % nebulizer solution Take 2.5 mLs (500 mcg total) by nebulization every 6 (six) hours as needed (ICD10: J44.9). Patient taking differently: Take 500 mcg by nebulization every 6 (six) hours as needed for wheezing or shortness of breath.  12/23/13  Yes Chesley Mires, MD  Ipratropium-Albuterol (COMBIVENT) 20-100 MCG/ACT AERS respimat Inhale 1 puff into the lungs every 6 (six) hours.   Yes Historical Provider, MD  lisinopril (PRINIVIL,ZESTRIL) 20 MG tablet Take 1 tablet (20 mg total) by mouth daily. 02/03/14  Yes [name], DO  Multiple Vitamin (MULTIVITAMIN WITH MINERALS) TABS tablet Take 1 tablet by mouth daily.   Yes Historical Provider, MD  ondansetron (ZOFRAN) 8 MG tablet Take 1 tablet (8 mg total) by mouth every 8 (eight) hours as needed for nausea or vomiting. 04/21/14  Yes Curt Bears, MD  PRESCRIPTION MEDICATION She receives chemo treatments at the [facility] with Dr. [name]. She received Carboplatin 310mg  and Etoposide 130mg  on 04/20/14 and Etoposide 130mg  on 04/21/14.   Yes Historical Provider, MD  ranitidine (ZANTAC) 150 MG tablet Take 150 mg by mouth 2 (two) times daily.   Yes Historical Provider, MD  rizatriptan (MAXALT-MLT) 5 MG disintegrating tablet Take 5 mg by mouth as needed for migraine. May repeat in 2 hours if needed   Yes Historical Provider, MD  simvastatin (ZOCOR) 20 MG tablet Take 20 mg by mouth daily.   Yes Historical Provider, MD  clonazePAM (KLONOPIN) 1 MG tablet TAKE 1 TABLET BY MOUTH TWICE DAILY AS NEEDED FOR ANXIETY 02/23/14   Sean Hommel, DO  COMBIVENT RESPIMAT 20-100 MCG/ACT AERS respimat INHALE 1 PUFF BY MOUTH EVERY 6 HOURS 12/23/13   Chesley Mires, MD  HYDROcodone-acetaminophen (NORCO) 10-325 MG per tablet Take 1 tablet by mouth every 6 (six) hours as needed (For pain.).  02/03/14   Historical Provider, MD  lidocaine-prilocaine (EMLA)  cream Apply 1 application topically as needed. Patient taking differently: Apply 1 application topically as needed (For port-a-cath.).  04/16/14   Curt Bears, MD  polyethylene glycol [facility] / GLYCOLAX) packet Take 17 g by mouth daily.    Historical Provider, MD  prochlorperazine (COMPAZINE) 10 MG tablet Take by mouth. Take as directed 04/16/14   Historical Provider, MD    Family History  Problem Relation Age of Onset  . Lung cancer Maternal Grandfather   . Cancer Maternal Grandmother     History   Social History  . Marital Status: Single    Spouse Name: N/A  . Number of Children: N/A  . Years of Education: N/A   Occupational History  . retired    Social History Main Topics  . Smoking status: Former Smoker -- 0.02 packs/day for 40 years    Types: Cigarettes    Quit date: 12/28/2013  . Smokeless tobacco: Not on file     Comment: 1 pack per 3-4 days 06/11/13-- QUIT around November 2015  . Alcohol Use: No  . Drug Use: No  . Sexual Activity: Not on file   Other Topics Concern  . Not on file   Social History Narrative      Review of Systems  Constitutional: Positive for fatigue. Negative for fever and chills.  HENT: Positive for congestion, postnasal drip and sinus pressure.   Respiratory:       Occ cough, some dyspnea with exertion  Cardiovascular: Negative for chest pain.  Gastrointestinal: Negative for vomiting, abdominal pain and blood in stool.       Occ nausea  Genitourinary: Negative for dysuria and hematuria.  Musculoskeletal: Negative for back pain.  Neurological: Negative for headaches.    Vital Signs: BP 132/71 mmHg  Pulse 105  Temp(Src) 98.4 F (36.9 C) (Oral)  Resp 22  SpO2 100%  Physical Exam  Constitutional: She is oriented to person, place, and time.  Cachetic appearing WF in NAD  Cardiovascular: Regular rhythm.   Sl tachy  Pulmonary/Chest: Effort normal.  Distant BS bilat  Abdominal: Soft. Bowel sounds are normal. There is no  tenderness.  Musculoskeletal: Normal range of motion. She exhibits no edema.  Neurological: She is alert and oriented to person, place, and time.    Imaging: Ct Chest Wo Contrast  04/03/2014   CLINICAL DATA:  Emphysema.  Weight loss and fatigue  EXAM: CT CHEST WITHOUT CONTRAST; CT ABDOMEN AND PELVIS WITH CONTRAST  TECHNIQUE: Multidetector CT imaging of the chest was performed following the standard protocol without IV contrast material administration. Multidetector CT imaging of the abdomen and pelvis was performed following the standard protocol following intravenous contrast maternal administration.  COMPARISON:  December 25, 2013 chest CT  CONTRAST:  100 mL Isovue 370 nonionic  FINDINGS: CT chest: There is extensive centrilobular and paraseptal emphysematous change. There is airspace consolidation in the superior segment of the left lower lobe. There is mild scarring in the bases.  On  axial slice 24 series 3, there is a nodular opacity measuring 1.2 x 0.9 cm in the posterior segment of the right upper lobe. In the superior segment of the right lower lobe on slice 27 series 3, there is a nodular opacity measuring 6 x 4 mm. On axial slice 30 series 3, there is a 2 mm nodular opacity in the medial segment of the right middle lobe. On axial slice 38 series 3, there is a 4 mm nodular opacity in the posterior aspect of the superior segment of the right lower lobe. There are multiple nodular opacities in the posterior segment of the right lower lobe, largest measuring 5 mm, seen on axial slice 49 series 3. Several 2-3 mm nodular opacities are noted in the posterior segment of the left lower lobe as well. There is a nodular opacity which is somewhat ill-defined in the superior segment of the left lower lobe on slice 33 series 3 measuring 1.0 x 0.6 cm.  There is fairly extensive adenopathy. There is a pretracheal lymph node measuring 2.4 x 2.1 cm. A second lymph node just anterior the carina measures 1.6 x 1.6 cm.  There is a lymph node immediately adjacent to the aortic arch on the left measuring 2.4 x 1.5 cm. There is prominence in the left hilum, felt to represent adenopathy. It is difficult to distinguish lymph node from adjacent vessels in this area without intravenous contrast. There is an enlarged sub- carinal lymph node measuring 2.4 x 1.4 cm.  There is atherosclerotic change in the aorta but no appreciable thoracic aortic aneurysm. No pulmonary embolus is identified on this noncontrast enhanced study. There is mild pericardial thickening anteriorly. There are no blastic or lytic bone lesions. Thyroid appears normal.  CT abdomen and pelvis: Liver is enlarged measuring 18.2 cm in length. There are metastatic foci throughout the liver. In the medial segment left lobe of the liver, there is a mass measuring 4.3 x 4.9 cm. In the anterior segment of the right lobe, there is a mass measuring 3.9 x 3.6 cm. In the inferior posterior segment of the right lobe of the liver, there is a 3.6 x 3.1 cm mass. There are a few subcentimeter areas of decreased attenuation that must be viewed as nonspecific in the liver. Gallbladder is not seen. Question surgical absence. There is no biliary duct dilatation.  Spleen, pancreas, and adrenals appear within normal limits. Kidneys bilaterally show no mass or hydronephrosis on either side. No renal or1 ureteral calculus is appreciable on either side.  In the pelvis, the urinary bladder is midline with wall thickness within normal limits. There is extensive increase in pelvic vascularity bilaterally in a generalized manner. There is no well-defined pelvic mass. Neither ovary is appreciably enlarged by CT. The appendix region appears normal.  There is no bowel obstruction. No free air or portal venous air. There is no appreciable ascites, adenopathy, or abscess in the abdomen or pelvis. There are no appreciable omental lesions in the abdomen or pelvis. There is extensive atherosclerotic  calcification in the aorta and iliac arteries. No aneurysm is seen. There is degenerative change in the lumbar spine. There are no blastic or lytic bone lesions.  IMPRESSION: CT chest: Multifocal adenopathy. Several nodular lesions concerning for small metastases. There is superior segment left lower lobe airspace consolidation. Underlying emphysema.  CT abdomen and pelvis: Sizable liver metastases. No appreciable adenopathy in the abdomen or pelvis.  Extensive prominence of pelvic vascular structures possibly could have neoplastic etiology but  also raises concern for pelvic congestion syndrome.  No bowel obstruction.  No abscess.  Extensive atherosclerotic change.  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 Primary site of carcinoma uncertain given this combination of findings.  These results will be called to the ordering clinician or representative by the Radiologist Assistant, and communication documented in the PACS or zVision Dashboard.   Electronically Signed   By: Lowella Grip M.D.   On: 04/03/2014 13:19   US Abdomen Complete  03/27/2014   CLINICAL DATA:  Initial encounter for abdominal mass in right upper quadrant. Early satiety.  EXAM: ULTRASOUND ABDOMEN COMPLETE  COMPARISON:  None.  FINDINGS: Gallbladder: No gallstones or wall thickening visualized. No sonographic Murphy sign noted.  Common bile duct: Diameter: Normal, 1 mm.  Liver: Multiple hepatic masses are identified. These demonstrate suspicious features, including central heterogeneous hyperechogenicity and a peripheral area of relative hypo echogenicity. Example in the right lobe of the liver at 3.0 x 2.9 x 3.4 cm. A separate more anterior right liver lobe 5.3 x 3.5 x 5.2 cm mass. This corresponds to the palpable abnormality.  More inferior right liver lobe 3.3 x 2.7 x 3.4 cm lesion.  IVC: No abnormality visualized.  Pancreas: Visualized portion unremarkable.  Spleen: Size and appearance within normal limits.  Right Kidney: Length: 11.0  cm. Echogenicity within normal limits. No mass or hydronephrosis visualized.  Left Kidney: Length: 11.6 cm. Echogenicity within normal limits. No mass or hydronephrosis visualized.  Abdominal aorta: No aneurysm visualized.  Other findings: No ascites.  IMPRESSION: Multiple liver lesions, suspicious for metastatic disease. Recommend further evaluation with post contrast Abdominal pelvic CT in order to more entirely characterize liver lesions and identify a potential primary.  These results will be called to the ordering clinician or representative by the Radiologist Assistant, and communication documented in the PACS or zVision Dashboard.   Electronically Signed   By: Abigail Miyamoto M.D.   On: 03/27/2014 08:36   Ct Abdomen Pelvis W Contrast  04/03/2014   : CT chest as well as CT abdomen and pelvis studies are combined into a single dictation.   Electronically Signed   By: Lowella Grip M.D.   On: 04/03/2014 13:20   US Biopsy  04/09/2014   CLINICAL DATA:  Lung nodules, thoracic adenopathy, multiple liver lesions suggesting metastatic disease  EXAM: ULTRASOUND-GUIDED CORE LIVER BIOPSY  TECHNIQUE: An ultrasound guided liver biopsy was thoroughly discussed with the patient and questions were answered. The benefits, risks, alternatives, and complications were also discussed. The patient understands and wishes to proceed with the procedure. A verbal as well as written consent was obtained.  Survey ultrasound of the liver was performed, a representative lesion was localized, and an appropriate skin entry site was determined. Skin site was marked, prepped with Betadine, and draped in usual sterile fashion, and infiltrated locally with 1% lidocaine.  Intravenous Fentanyl and Versed were administered as conscious sedation during continuous cardiorespiratory monitoring by the radiology RN, with a total moderate sedation time of 5 minutes.  A 17 gauge trocar needle was advanced under ultrasound guidance into the liver to  the margin of the lesion. 3 coaxial 18gauge core samples were then obtained through the guide needle. The guide needle was removed. Post procedure scans demonstrate no apparent complication.  COMPLICATIONS: COMPLICATIONS None immediate  FINDINGS: Multiple large liver lesions are again identified corresponding to findings on recent CT. Ultrasound-guided core biopsy of a representative lesion was performed without complication.  IMPRESSION: 1. Technically successful ultrasound guided core  liver lesion core biopsy.   Electronically Signed   By: Lucrezia Europe M.D.   On: 04/09/2014 15:22   Dg Femur Min 2 Views Left  03/26/2014   CLINICAL DATA:  Groin region pain after fall  EXAM: DG FEMUR 2+V*L*  COMPARISON:  None.  FINDINGS: Frontal and lateral views were obtained. There is no apparent fracture or dislocation. Joint spaces appear intact. No erosive change.  IMPRESSION: No fracture or dislocation.  No appreciable arthropathic change.   Electronically Signed   By: Lowella Grip M.D.   On: 03/26/2014 13:12    Labs:  CBC:  Recent Labs  04/09/14 1120 04/16/14 1413 04/20/14 1314 04/24/14 1350  WBC 6.4 6.5 6.9 28.7*  HGB 11.0* 10.8* 10.7* 11.0*  HCT 35.9* 34.5* 34.0* 35.5*  PLT 320 332 309 291    COAGS:  Recent Labs  04/02/14 1642 04/09/14 1120 04/24/14 1350  INR 0.95 0.94 0.88  APTT 30 30  --     BMP:  Recent Labs  06/11/13 1436 02/03/14 1457 04/02/14 1642 04/09/14 1120 04/20/14 1314  NA 139 142 139 141 142  K 3.6 4.2 4.0 3.5 3.8  CL 95* 94* 94* 97  --   CO2 40* 36* 36* 38* 37*  GLUCOSE 97 104* 79 91 79  BUN 7 7 10 12  9.6  CALCIUM 9.0 9.6 9.8 9.3 9.0  CREATININE 0.5 0.41* 0.45* 0.38* 0.6  GFRNONAA  --  >89  --  >90  --   GFRAA  --  >89  --  >90  --     LIVER FUNCTION TESTS:  Recent Labs  02/03/14 1457 04/02/14 1642 04/09/14 1120 04/20/14 1314  BILITOT 0.3 0.4 0.6 0.20  AST 24 36 39* 45*  ALT 19 20 24 20   ALKPHOS 79 101 103 127  PROT 7.3 7.2 7.2 6.8  ALBUMIN  4.6 4.0 3.9 3.5    TUMOR MARKERS: No results for input(s): AFPTM, CEA, CA199, CHROMGRNA in the last 8760 hours.  Assessment and Plan: Alexa Stephens is a 65 y.o. female with history of recently diagnosed metastatic lung carcinoma who presents today for port a cath placement for chemotherapy. She has also been treated for recent bout of sinusitis with azithromycin and has received neulasta as well. Her WBC today is 28k. She is afebrile with less facial /sinus discomfort. Case was reviewed with Drs. Wagner/Ennever and decision made to proceed with port placement. Details/risks of procedure , including but not limited to internal bleeding, venous thrombosis and  infection leading to possible port removal were d/w pt with her understanding and consent.     Signed: Autumn Messing 04/24/2014, 3:30 PM   I spent a total of 20 minutes face to face in clinical consultation, greater than 50% of which was counseling/coordinating care for port a cath placement

## 2014-04-24 NOTE — Telephone Encounter (Signed)
Called and spoke to [facility] who is requesting a specific DME order for wheelchair, I was unaware of order. Melissa stated Jess and Crystal are aware of order. Spoke with Crystal- we are no longer using "home use only DME" orders and what ever Melissa needs in the order we can place in a regular "amb ref DME" order.   lmtcb for Melissa to relay message.

## 2014-04-24 NOTE — Procedures (Signed)
Interventional Radiology Procedure Note  Procedure: Placement of a right IJ approach single lumen PowerPort.  Tip is positioned at the superior cavoatrial junction and catheter is ready for immediate use.  Complications: No immediate Recommendations:  - Ok to shower tomorrow - Do not submerge for 7 days - Routine line care   Signed,  Kona Lover S. Shaan Rhoads, DO    

## 2014-04-24 NOTE — Discharge Instructions (Signed)
Implanted Port Insertion, Care After Refer to this sheet in the next few weeks. These instructions provide you with information on caring for yourself after your procedure. Your health care provider may also give you more specific instructions. Your treatment has been planned according to current medical practices, but problems sometimes occur. Call your health care provider if you have any problems or questions after your procedure. WHAT TO EXPECT AFTER THE PROCEDURE After your procedure, it is typical to have the following:   Discomfort at the port insertion site. Ice packs to the area will help.  Bruising on the skin over the port. This will subside in 3-4 days. HOME CARE INSTRUCTIONS  After your port is placed, you will get a manufacturer's information card. The card has information about your port. Keep this card with you at all times.   Know what kind of port you have. There are many types of ports available.   Wear a medical alert bracelet in case of an emergency. This can help alert health care workers that you have a port.   The port can stay in for as long as your health care provider believes it is necessary.   A home health care nurse may give medicines and take care of the port.   You or a family member can get special training and directions for giving medicine and taking care of the port at home.  SEEK MEDICAL CARE IF:   Your port does not flush or you are unable to get a blood return.   You have a fever or chills. SEEK IMMEDIATE MEDICAL CARE IF:  You have new fluid or pus coming from your incision.   You notice a bad smell coming from your incision site.   You have swelling, pain, or more redness at the incision or port site.   You have chest pain or shortness of breath. Document Released: 12/04/2012 Document Revised: 02/18/2013 Document Reviewed: 12/04/2012 [facility] Patient Information 2015 [place], [place]. This information is not intended to replace  advice given to you by your health care provider. Make sure you discuss any questions you have with your health care provider.     Implanted [facility] Guide An implanted port is a type of central line that is placed under the skin. Central lines are used to provide IV access when treatment or nutrition needs to be given through a person's veins. Implanted ports are used for long-term IV access. An implanted port may be placed because:   You need IV medicine that would be irritating to the small veins in your hands or arms.   You need long-term IV medicines, such as antibiotics.   You need IV nutrition for a long period.   You need frequent blood draws for lab tests.   You need dialysis.  Implanted ports are usually placed in the chest area, but they can also be placed in the upper arm, the abdomen, or the leg. An implanted port has two main parts:   Reservoir. The reservoir is round and will appear as a small, raised area under your skin. The reservoir is the part where a needle is inserted to give medicines or draw blood.   Catheter. The catheter is a thin, flexible tube that extends from the reservoir. The catheter is placed into a large vein. Medicine that is inserted into the reservoir goes into the catheter and then into the vein.  HOW WILL I CARE FOR MY INCISION SITE? Do not get the incision  site wet. Bathe or shower as directed by your health care provider.  HOW IS MY PORT ACCESSED? Special steps must be taken to access the port:   Before the port is accessed, a numbing cream can be placed on the skin. This helps numb the skin over the port site.   Your health care provider uses a sterile technique to access the port.  Your health care provider must put on a mask and sterile gloves.  The skin over your port is cleaned carefully with an antiseptic and allowed to dry.  The port is gently pinched between sterile gloves, and a needle is inserted into the port.  Only  "non-coring" port needles should be used to access the port. Once the port is accessed, a blood return should be checked. This helps ensure that the port is in the vein and is not clogged.   If your port needs to remain accessed for a constant infusion, a clear (transparent) bandage will be placed over the needle site. The bandage and needle will need to be changed every week, or as directed by your health care provider.   Keep the bandage covering the needle clean and dry. Do not get it wet. Follow your health care provider's instructions on how to take a shower or bath while the port is accessed.   If your port does not need to stay accessed, no bandage is needed over the port.  WHAT IS FLUSHING? Flushing helps keep the port from getting clogged. Follow your health care provider's instructions on how and when to flush the port. Ports are usually flushed with saline solution or a medicine called heparin. The need for flushing will depend on how the port is used.   If the port is used for intermittent medicines or blood draws, the port will need to be flushed:   After medicines have been given.   After blood has been drawn.   As part of routine maintenance.   If a constant infusion is running, the port may not need to be flushed.  HOW LONG WILL MY PORT STAY IMPLANTED? The port can stay in for as long as your health care provider thinks it is needed. When it is time for the port to come out, surgery will be done to remove it. The procedure is similar to the one performed when the port was put in.  WHEN SHOULD I SEEK IMMEDIATE MEDICAL CARE? When you have an implanted port, you should seek immediate medical care if:   You notice a bad smell coming from the incision site.   You have swelling, redness, or drainage at the incision site.   You have more swelling or pain at the port site or the surrounding area.   You have a fever that is not controlled with medicine. Document  Released: 02/13/2005 Document Revised: 12/04/2012 Document Reviewed: 10/21/2012 [facility] Patient Information 2015 [place], [place]. This information is not intended to replace advice given to you by your health care provider. Make sure you discuss any questions you have with your health care provider. Conscious Sedation Sedation is the use of medicines to promote relaxation and relieve discomfort and anxiety. Conscious sedation is a type of sedation. Under conscious sedation you are less alert than normal but are still able to respond to instructions or stimulation. Conscious sedation is used during short medical and dental procedures. It is milder than deep sedation or general anesthesia and allows you to return to your regular activities sooner.  LET  YOUR HEALTH CARE PROVIDER KNOW ABOUT:   Any allergies you have.  All medicines you are taking, including vitamins, herbs, eye drops, creams, and over-the-counter medicines.  Use of steroids (by mouth or creams).  Previous problems you or members of your family have had with the use of anesthetics.  Any blood disorders you have.  Previous surgeries you have had.  Medical conditions you have.  Possibility of pregnancy, if this applies.  Use of cigarettes, alcohol, or illegal drugs. RISKS AND COMPLICATIONS Generally, this is a safe procedure. However, as with any procedure, problems can occur. Possible problems include:  Oversedation.  Trouble breathing on your own. You may need to have a breathing tube until you are awake and breathing on your own.  Allergic reaction to any of the medicines used for the procedure. BEFORE THE PROCEDURE  You may have blood tests done. These tests can help show how well your kidneys and liver are working. They can also show how well your blood clots.  A physical exam will be done.  Only take medicines as directed by your health care provider. You may need to stop taking medicines (such as blood  thinners, aspirin, or nonsteroidal anti-inflammatory drugs) before the procedure.   Do not eat or drink at least 6 hours before the procedure or as directed by your health care provider.  Arrange for a responsible adult, family member, or friend to take you home after the procedure. He or she should stay with you for at least 24 hours after the procedure, until the medicine has worn off. PROCEDURE   An intravenous (IV) catheter will be inserted into one of your veins. Medicine will be able to flow directly into your body through this catheter. You may be given medicine through this tube to help prevent pain and help you relax.  The medical or dental procedure will be done. AFTER THE PROCEDURE  You will stay in a recovery area until the medicine has worn off. Your blood pressure and pulse will be checked.   Depending on the procedure you had, you may be allowed to go home when you can tolerate liquids and your pain is under control. Document Released: 11/08/2000 Document Revised: 02/18/2013 Document Reviewed: 10/21/2012 [facility] Patient Information 2015 [place], [place]. This information is not intended to replace advice given to you by your health care provider. Make sure you discuss any questions you have with your health care provider.

## 2014-04-25 ENCOUNTER — Other Ambulatory Visit: Payer: Self-pay | Admitting: Internal Medicine

## 2014-04-25 DIAGNOSIS — C3490 Malignant neoplasm of unspecified part of unspecified bronchus or lung: Secondary | ICD-10-CM

## 2014-04-25 MED ORDER — DEXAMETHASONE 4 MG PO TABS: 4.0000 mg | ORAL_TABLET | Freq: Two times a day (BID) | ORAL | Status: DC

## 2014-04-25 NOTE — Progress Notes (Signed)
I reviewed the results of the MRI of the brain today. 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 I called the patient but no answer. I left a voicemail for her to pick up prescription for Decadron 4 mg by mouth twice a day from her pharmacy. I will go over the results of the MRI of the brain with her next week. No need to change any of her current treatment plan. We will monitor these lesions on upcoming scans after completion of 2 cycles of her systemic chemotherapy.

## 2014-04-27 ENCOUNTER — Telehealth: Payer: Self-pay | Admitting: Pulmonary Disease

## 2014-04-27 ENCOUNTER — Telehealth: Payer: Self-pay | Admitting: Medical Oncology

## 2014-04-27 ENCOUNTER — Other Ambulatory Visit (HOSPITAL_BASED_OUTPATIENT_CLINIC_OR_DEPARTMENT_OTHER): Payer: Medicare Other

## 2014-04-27 ENCOUNTER — Encounter (HOSPITAL_COMMUNITY): Payer: Medicare Other

## 2014-04-27 ENCOUNTER — Encounter: Payer: Self-pay | Admitting: Physician Assistant

## 2014-04-27 ENCOUNTER — Telehealth: Payer: Self-pay | Admitting: Internal Medicine

## 2014-04-27 ENCOUNTER — Ambulatory Visit (HOSPITAL_BASED_OUTPATIENT_CLINIC_OR_DEPARTMENT_OTHER): Payer: Medicare Other | Admitting: Physician Assistant

## 2014-04-27 VITALS — BP 119/73 | HR 91 | Temp 98.5°F | Resp 18 | Ht 60.0 in | Wt 97.4 lb

## 2014-04-27 DIAGNOSIS — C3492 Malignant neoplasm of unspecified part of left bronchus or lung: Secondary | ICD-10-CM

## 2014-04-27 DIAGNOSIS — C7B8 Other secondary neuroendocrine tumors: Secondary | ICD-10-CM

## 2014-04-27 DIAGNOSIS — C3490 Malignant neoplasm of unspecified part of unspecified bronchus or lung: Secondary | ICD-10-CM

## 2014-04-27 DIAGNOSIS — C7A8 Other malignant neuroendocrine tumors: Secondary | ICD-10-CM

## 2014-04-27 DIAGNOSIS — R530 Neoplastic (malignant) related fatigue: Secondary | ICD-10-CM

## 2014-04-27 LAB — CBC WITH DIFFERENTIAL/PLATELET
BASO%: 0.2 % (ref 0.0–2.0)
Basophils Absolute: 0 10*3/uL (ref 0.0–0.1)
EOS%: 0 % (ref 0.0–7.0)
Eosinophils Absolute: 0 10*3/uL (ref 0.0–0.5)
HCT: 32.4 % — ABNORMAL LOW (ref 34.8–46.6)
HGB: 10 g/dL — ABNORMAL LOW (ref 11.6–15.9)
LYMPH#: 1 10*3/uL (ref 0.9–3.3)
LYMPH%: 4 % — AB (ref 14.0–49.7)
MCH: 29.5 pg (ref 25.1–34.0)
MCHC: 31 g/dL — ABNORMAL LOW (ref 31.5–36.0)
MCV: 95.1 fL (ref 79.5–101.0)
MONO#: 1.1 10*3/uL — AB (ref 0.1–0.9)
MONO%: 4.6 % (ref 0.0–14.0)
NEUT#: 22.3 10*3/uL — ABNORMAL HIGH (ref 1.5–6.5)
NEUT%: 91.2 % — AB (ref 38.4–76.8)
PLATELETS: 267 10*3/uL (ref 145–400)
RBC: 3.41 10*6/uL — ABNORMAL LOW (ref 3.70–5.45)
RDW: 14.2 % (ref 11.2–14.5)
WBC: 24.5 10*3/uL — ABNORMAL HIGH (ref 3.9–10.3)

## 2014-04-27 LAB — COMPREHENSIVE METABOLIC PANEL (CC13)
ALK PHOS: 185 U/L — AB (ref 40–150)
ALT: 28 U/L (ref 0–55)
AST: 29 U/L (ref 5–34)
Albumin: 3.6 g/dL (ref 3.5–5.0)
Anion Gap: 7 mEq/L (ref 3–11)
BILIRUBIN TOTAL: 0.32 mg/dL (ref 0.20–1.20)
BUN: 17.4 mg/dL (ref 7.0–26.0)
CO2: 35 mEq/L — ABNORMAL HIGH (ref 22–29)
CREATININE: 0.5 mg/dL — AB (ref 0.6–1.1)
Calcium: 9.6 mg/dL (ref 8.4–10.4)
Chloride: 97 mEq/L — ABNORMAL LOW (ref 98–109)
EGFR: >90 mL/min/{1.73_m2} (ref >90)
GLUCOSE: 106 mg/dL (ref 70–140)
Potassium: 4.8 mEq/L (ref 3.5–5.1)
Sodium: 139 mEq/L (ref 136–145)
Total Protein: 6.7 g/dL (ref 6.4–8.3)

## 2014-04-27 MED ORDER — ALBUTEROL SULFATE HFA 108 (90 BASE) MCG/ACT IN AERS: 2.0000 | INHALATION_SPRAY | Freq: Four times a day (QID) | RESPIRATORY_TRACT | Status: DC | PRN

## 2014-04-27 NOTE — Telephone Encounter (Signed)
gv adn rpinted appt sched anda vs for pt for March  °

## 2014-04-27 NOTE — Telephone Encounter (Signed)
Called spoke with pt son. Aware RX for proair has been sent in. Nothing further needed

## 2014-04-27 NOTE — Telephone Encounter (Signed)
Confirmed appt today. Son concerned about PET scan being scheduled later -does it need to be sooner?  I told him to discuss it at visit today.

## 2014-04-27 NOTE — Telephone Encounter (Signed)
New order placed. "Amb ref dme" order with smart text in comments of order. Nothing further needed.

## 2014-04-27 NOTE — Progress Notes (Signed)
No images are attached to the encounter. No scans are attached to the encounter. No scans are attached to the encounter. [facility] VISIT PROGRESS NOTE  [name], [place] [address] [place] [address]  DIAGNOSIS: Small cell lung carcinoma   Staging form: Lung, AJCC 7th Edition     Clinical stage from 04/18/2014: Stage IV (T1a, N2, M1b) - Signed by Curt Bears, MD on 04/18/2014       Staging comments: Extensive stage small cell lung cancer  PRIOR THERAPY: None  CURRENT THERAPY: Systemic chemotherapy with carboplatin for an ECF 5 given on day 1 and etoposide at 100 mg/m given on days 1, 2 and 3 with Neulasta support given on day 4. Status post 1 cycle.  INTERVAL HISTORY: Alexa Stephens 64 y.o. female returns for a scheduled regular and management visit for followup of her recently diagnosed extensive stage small cell lung cancer. She is status post Port-A-Cath placement on 04/24/2014. She did have an MRI of her brain however missed her PET scan and this has been rescheduled for 05/04/2014. She reports a history of sinus congestion.  She tolerated her first cycle of chemotherapy relatively well the exception of some fatigue and generalized malaise. She continues to be short of breath and is on home oxygen via nasal cannula.  MEDICAL HISTORY: Past Medical History  Diagnosis Date  . Anxiety disorder   . Asthma   . COPD (chronic obstructive pulmonary disease)     Not on home O2 yet but is prescribed.  . Headache(784.0)   . Hyperlipemia   . Hypertension   . Pneumonia Sept 2013  . Sepsis Sept 2013  . Cancer     lung cancer     ALLERGIES:  is allergic to benadryl; compazine; excedrin extra strength; flexeril; lexapro; naproxen; skelaxin; and tylenol.  MEDICATIONS:  Current Outpatient Prescriptions  Medication Sig Dispense Refill  . albuterol (PROVENTIL HFA;VENTOLIN HFA) 108 (90 BASE) MCG/ACT inhaler Inhale 2 puffs into the lungs every 4 (four) hours as  needed. For shortness of breath. 1 Inhaler 5  . albuterol (PROVENTIL) (2.5 MG/3ML) 0.083% nebulizer solution Inhale 3 mLs into the lungs every 6 (six) hours as needed for wheezing or shortness of breath.   12  . Calcium Carbonate-Vitamin D (CALCIUM-VITAMIN D) 500-200 MG-UNIT per tablet Take 1 tablet by mouth daily. When remembers    . clonazePAM (KLONOPIN) 1 MG tablet TAKE 1 TABLET BY MOUTH TWICE DAILY AS NEEDED FOR ANXIETY 60 tablet 0  . clonazePAM (KLONOPIN) 1 MG tablet Take 0.5 mg by mouth 2 (two) times daily as needed for anxiety.    . COMBIVENT RESPIMAT 20-100 MCG/ACT AERS respimat INHALE 1 PUFF BY MOUTH EVERY 6 HOURS 4 g 0  . dexamethasone (DECADRON) 4 MG tablet Take 1 tablet (4 mg total) by mouth 2 (two) times daily. 30 tablet 0  . Fluticasone Furoate-Vilanterol (BREO ELLIPTA) 100-25 MCG/INH AEPB Inhale 1 puff into the lungs daily. 28 each 5  . HYDROcodone-acetaminophen (NORCO) 10-325 MG per tablet Take 1 tablet by mouth every 6 (six) hours as needed (For pain.).   0  . HYDROcodone-homatropine (HYCODAN) 5-1.5 MG/5ML syrup Take 5 mLs by mouth every 6 (six) hours as needed for cough. 120 mL 0  . Ipratropium-Albuterol (COMBIVENT) 20-100 MCG/ACT AERS respimat Inhale 1 puff into the lungs every 6 (six) hours.    . lidocaine-prilocaine (EMLA) cream Apply 1 application topically as needed. (Patient taking differently: Apply 1 application topically as needed (For  port-a-cath.). ) 30 g 0  . lisinopril (PRINIVIL,ZESTRIL) 20 MG tablet Take 1 tablet (20 mg total) by mouth daily. 90 tablet 3  . Multiple Vitamin (MULTIVITAMIN WITH MINERALS) TABS tablet Take 1 tablet by mouth daily.    . ondansetron (ZOFRAN) 8 MG tablet Take 1 tablet (8 mg total) by mouth every 8 (eight) hours as needed for nausea or vomiting. 20 tablet 0  . polyethylene glycol (MIRALAX / GLYCOLAX) packet Take 17 g by mouth daily.    Marland Kitchen PRESCRIPTION MEDICATION She receives chemo treatments at the [facility] with Dr. [name].  She received Carboplatin 310mg  and Etoposide 130mg  on 04/20/14 and Etoposide 130mg  on 04/21/14.    Marland Kitchen prochlorperazine (COMPAZINE) 10 MG tablet Take by mouth. Take as directed    . ranitidine (ZANTAC) 150 MG tablet Take 150 mg by mouth 2 (two) times daily.    . rizatriptan (MAXALT-MLT) 5 MG disintegrating tablet Take 5 mg by mouth as needed for migraine. May repeat in 2 hours if needed    . simvastatin (ZOCOR) 20 MG tablet Take 20 mg by mouth daily.    Marland Kitchen albuterol (PROAIR HFA) 108 (90 BASE) MCG/ACT inhaler Inhale 2 puffs into the lungs every 6 (six) hours as needed for wheezing or shortness of breath. 3 Inhaler 1  . ipratropium (ATROVENT) 0.02 % nebulizer solution Take 2.5 mLs (500 mcg total) by nebulization every 6 (six) hours as needed (ICD10: J44.9). (Patient not taking: Reported on 04/27/2014) 75 mL 5   No current facility-administered medications for this visit.    SURGICAL HISTORY:  Past Surgical History  Procedure Laterality Date  . Cosmetic surgery    . Cystectomy    . Hand surgery      REVIEW OF SYSTEMS:  Review of Systems  Constitutional: Positive for malaise/fatigue. Negative for fever, chills, weight loss and diaphoresis.  HENT: Positive for congestion. Negative for ear discharge, ear pain, hearing loss, nosebleeds, sore throat and tinnitus.        Reports sinus congestion- a long standing issue  Eyes: Negative for blurred vision, double vision, photophobia, pain, discharge and redness.  Respiratory: Negative for cough, hemoptysis, sputum production, shortness of breath, wheezing and stridor.   Cardiovascular: Negative for chest pain, palpitations, orthopnea, claudication, leg swelling and PND.  Gastrointestinal: Negative for heartburn, nausea, vomiting, abdominal pain, diarrhea, constipation, blood in stool and melena.  Genitourinary: Negative.   Musculoskeletal: Negative.   Skin: Negative.   Neurological: Negative for dizziness, tingling, focal weakness, seizures, weakness and  headaches.  Endo/Heme/Allergies: Does not bruise/bleed easily.  Psychiatric/Behavioral: Negative for depression. The patient is not nervous/anxious and does not have insomnia.      PHYSICAL EXAMINATION: Physical Exam  Constitutional: She is oriented to person, place, and time and well-developed, well-nourished, and in no distress.  Comfortably seated in a wheelchair on oxygen via nasal cannula  HENT:  Head: Normocephalic and atraumatic.  Mouth/Throat: Oropharynx is clear and moist.  Eyes: Pupils are equal, round, and reactive to light.  Neck: Normal range of motion. Neck supple. No JVD present. No tracheal deviation present. No thyromegaly present.  Cardiovascular: Normal rate, regular rhythm, normal heart sounds and intact distal pulses.  Exam reveals no gallop and no friction rub.   No murmur heard. Pulmonary/Chest: Effort normal and breath sounds normal. No respiratory distress. She has no wheezes. She has no rales.  Abdominal: Soft. Bowel sounds are normal. She exhibits no distension and no mass. There is no tenderness.  Musculoskeletal: Normal range  of motion. She exhibits no edema or tenderness.  Lymphadenopathy:    She has no cervical adenopathy.  Neurological: She is alert and oriented to person, place, and time. She has normal reflexes.  Skin: Skin is warm and dry. No rash noted.    ECOG PERFORMANCE STATUS: 2 - Symptomatic, <50% confined to bed  Blood pressure 119/73, pulse 91, temperature 98.5 F (36.9 C), temperature source Oral, resp. rate 18, height 5' (1.524 m), weight 97 lb 6.4 oz (44.18 kg), SpO2 99 %.  LABORATORY DATA: Lab Results  Component Value Date   WBC 24.5* 04/27/2014   HGB 10.0* 04/27/2014   HCT 32.4* 04/27/2014   MCV 95.1 04/27/2014   PLT 267 04/27/2014      Chemistry      Component Value Date/Time   NA 139 04/27/2014 1508   NA 141 04/09/2014 1120   K 4.8 04/27/2014 1508   K 3.5 04/09/2014 1120   CL 97 04/09/2014 1120   CO2 35* 04/27/2014 1508    CO2 38* 04/09/2014 1120   BUN 17.4 04/27/2014 1508   BUN 12 04/09/2014 1120   CREATININE 0.5* 04/27/2014 1508   CREATININE 0.38* 04/09/2014 1120   CREATININE 0.45* 04/02/2014 1642      Component Value Date/Time   CALCIUM 9.6 04/27/2014 1508   CALCIUM 9.3 04/09/2014 1120   ALKPHOS 185* 04/27/2014 1508   ALKPHOS 103 04/09/2014 1120   AST 29 04/27/2014 1508   AST 39* 04/09/2014 1120   ALT 28 04/27/2014 1508   ALT 24 04/09/2014 1120   BILITOT 0.32 04/27/2014 1508   BILITOT 0.6 04/09/2014 1120       RADIOGRAPHIC STUDIES:  Ct Chest Wo Contrast  04/03/2014   CLINICAL DATA:  Emphysema.  Weight loss and fatigue  EXAM: CT CHEST WITHOUT CONTRAST; CT ABDOMEN AND PELVIS WITH CONTRAST  TECHNIQUE: Multidetector CT imaging of the chest was performed following the standard protocol without IV contrast material administration. Multidetector CT imaging of the abdomen and pelvis was performed following the standard protocol following intravenous contrast maternal administration.  COMPARISON:  December 25, 2013 chest CT  CONTRAST:  100 mL Isovue 370 nonionic  FINDINGS: CT chest: There is extensive centrilobular and paraseptal emphysematous change. There is airspace consolidation in the superior segment of the left lower lobe. There is mild scarring in the bases.  On axial slice 24 series 3, there is a nodular opacity measuring 1.2 x 0.9 cm in the posterior segment of the right upper lobe. In the superior segment of the right lower lobe on slice 27 series 3, there is a nodular opacity measuring 6 x 4 mm. On axial slice 30 series 3, there is a 2 mm nodular opacity in the medial segment of the right middle lobe. On axial slice 38 series 3, there is a 4 mm nodular opacity in the posterior aspect of the superior segment of the right lower lobe. There are multiple nodular opacities in the posterior segment of the right lower lobe, largest measuring 5 mm, seen on axial slice 49 series 3. Several 2-3 mm nodular  opacities are noted in the posterior segment of the left lower lobe as well. There is a nodular opacity which is somewhat ill-defined in the superior segment of the left lower lobe on slice 33 series 3 measuring 1.0 x 0.6 cm.  There is fairly extensive adenopathy. There is a pretracheal lymph node measuring 2.4 x 2.1 cm. A second lymph node just anterior the carina measures 1.6 x 1.6  cm. There is a lymph node immediately adjacent to the aortic arch on the left measuring 2.4 x 1.5 cm. There is prominence in the left hilum, felt to represent adenopathy. It is difficult to distinguish lymph node from adjacent vessels in this area without intravenous contrast. There is an enlarged sub- carinal lymph node measuring 2.4 x 1.4 cm.  There is atherosclerotic change in the aorta but no appreciable thoracic aortic aneurysm. No pulmonary embolus is identified on this noncontrast enhanced study. There is mild pericardial thickening anteriorly. There are no blastic or lytic bone lesions. Thyroid appears normal.  CT abdomen and pelvis: Liver is enlarged measuring 18.2 cm in length. There are metastatic foci throughout the liver. In the medial segment left lobe of the liver, there is a mass measuring 4.3 x 4.9 cm. In the anterior segment of the right lobe, there is a mass measuring 3.9 x 3.6 cm. In the inferior posterior segment of the right lobe of the liver, there is a 3.6 x 3.1 cm mass. There are a few subcentimeter areas of decreased attenuation that must be viewed as nonspecific in the liver. Gallbladder is not seen. Question surgical absence. There is no biliary duct dilatation.  Spleen, pancreas, and adrenals appear within normal limits. Kidneys bilaterally show no mass or hydronephrosis on either side. No renal or1 ureteral calculus is appreciable on either side.  In the pelvis, the urinary bladder is midline with wall thickness within normal limits. There is extensive increase in pelvic vascularity bilaterally in a  generalized manner. There is no well-defined pelvic mass. Neither ovary is appreciably enlarged by CT. The appendix region appears normal.  There is no bowel obstruction. No free air or portal venous air. There is no appreciable ascites, adenopathy, or abscess in the abdomen or pelvis. There are no appreciable omental lesions in the abdomen or pelvis. There is extensive atherosclerotic calcification in the aorta and iliac arteries. No aneurysm is seen. There is degenerative change in the lumbar spine. There are no blastic or lytic bone lesions.  IMPRESSION: CT chest: Multifocal adenopathy. Several nodular lesions concerning for small metastases. There is superior segment left lower lobe airspace consolidation. Underlying emphysema.  CT abdomen and pelvis: Sizable liver metastases. No appreciable adenopathy in the abdomen or pelvis.  Extensive prominence of pelvic vascular structures possibly could have neoplastic etiology but also raises concern for pelvic congestion syndrome.  No bowel obstruction.  No abscess.  Extensive atherosclerotic change.  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 Primary site of carcinoma uncertain given this combination of findings.  These results will be called to the ordering clinician or representative by the Radiologist Assistant, and communication documented in the PACS or zVision Dashboard.   Electronically Signed   By: Lowella Grip M.D.   On: 04/03/2014 13:19   Mr Jeri Cos JF Contrast  04/25/2014   CLINICAL DATA:  Small cell lung cancer.  EXAM: MRI HEAD WITHOUT AND WITH CONTRAST  TECHNIQUE: Multiplanar, multiecho pulse sequences of the brain and surrounding structures were obtained without and with intravenous contrast.  CONTRAST:  7mL MULTIHANCE GADOBENATE DIMEGLUMINE 529 MG/ML IV SOLN  COMPARISON:  CT head without contrast 07/12/2013  FINDINGS: There is a ring-enhancing lesion in the lateral right cerebellum measures 11 x 12 x 8 mm. A peripherally enhancing lesion along  the superior right temporal gyrus measures 6 x 7 x 8 mm. A peripherally enhancing lesion in the right coronal radiata measures 5 x 6 x 7 mm. A 5 mm lesion  is present in the left cerebellum. There is restricted diffusion associated with each of these lesions, compatible with small cell lung cancer metastases.  A 6 mm enhancing lesion anteriorly in the inferior right cerebellum does not exhibit significant restricted diffusion.  There is moderate vasogenic edema associated with each of these lesions.  Extensive periventricular and subcortical white matter disease is separately noted. There is diffuse white matter change within the central pons.  Flow is present in the major intracranial arteries. The patient is status post bilateral lens replacements.  Remote ischemic changes are present within the basal ganglia bilaterally.  Mild mucosal thickening is present in the anterior ethmoid air cells bilaterally. The paranasal sinuses and mastoid air cells are clear.  IMPRESSION: 1. Enhancing metastases are noted within the right coronal radiata, the right superior temporal gyrus, in the cerebellum bilaterally. Two cerebellar lesions are present on the right and one on the left. 2. These lesions exhibit restricted diffusion and surrounding vasogenic edema compatible with metastatic disease of small cell lung cancer. 3. The patient also has extensive chronic white matter disease reflecting the sequelae of microvascular ischemia. There is extensive white matter disease within the brainstem. 4. Minimal anterior ethmoid sinus disease.   Electronically Signed   By: San Morelle M.D.   On: 04/25/2014 14:36   Ct Abdomen Pelvis W Contrast  04/03/2014   : CT chest as well as CT abdomen and pelvis studies are combined into a single dictation.   Electronically Signed   By: Lowella Grip M.D.   On: 04/03/2014 13:20   US Biopsy  04/09/2014   CLINICAL DATA:  Lung nodules, thoracic adenopathy, multiple liver lesions  suggesting metastatic disease  EXAM: ULTRASOUND-GUIDED CORE LIVER BIOPSY  TECHNIQUE: An ultrasound guided liver biopsy was thoroughly discussed with the patient and questions were answered. The benefits, risks, alternatives, and complications were also discussed. The patient understands and wishes to proceed with the procedure. A verbal as well as written consent was obtained.  Survey ultrasound of the liver was performed, a representative lesion was localized, and an appropriate skin entry site was determined. Skin site was marked, prepped with Betadine, and draped in usual sterile fashion, and infiltrated locally with 1% lidocaine.  Intravenous Fentanyl and Versed were administered as conscious sedation during continuous cardiorespiratory monitoring by the radiology RN, with a total moderate sedation time of 5 minutes.  A 17 gauge trocar needle was advanced under ultrasound guidance into the liver to the margin of the lesion. 3 coaxial 18gauge core samples were then obtained through the guide needle. The guide needle was removed. Post procedure scans demonstrate no apparent complication.  COMPLICATIONS: COMPLICATIONS None immediate  FINDINGS: Multiple large liver lesions are again identified corresponding to findings on recent CT. Ultrasound-guided core biopsy of a representative lesion was performed without complication.  IMPRESSION: 1. Technically successful ultrasound guided core liver lesion core biopsy.   Electronically Signed   By: Lucrezia Europe M.D.   On: 04/09/2014 15:22   Ir Fluoro Guide Cv Line Right  04/24/2014   CLINICAL DATA:  64 year old female with a history of lung carcinoma. She has been referred for port catheter placement.  EXAM: IR RIGHT FLOURO GUIDE CV LINE; IR ULTRASOUND GUIDANCE VASC ACCESS RIGHT  Date: 04/24/2014  ANESTHESIA/SEDATION: Moderate (conscious) sedation was administered during this procedure. A total of 2.0 mg Versed and 100 mg Fentanyl were administered intravenously. The  patient's vital signs were monitored continuously by radiology nursing throughout the course of the procedure.  Total sedation time: 45 minutes  FLUOROSCOPY TIME:  18 seconds  TECHNIQUE: The right neck and chest was prepped with chlorhexidine, and draped in the usual sterile fashion using maximum barrier technique (cap and mask, sterile gown, sterile gloves, large sterile sheet, hand hygiene and cutaneous antiseptic). Antibiotic prophylaxis was provided with 2.0g Ancef administered IV one hour prior to skin incision. Local anesthesia was attained by infiltration with 1% lidocaine without epinephrine.  Ultrasound demonstrated patency of the right internal jugular vein, and this was documented with an image. Under real-time ultrasound guidance, this vein was accessed with a 21 gauge micropuncture needle and image documentation was performed. A small dermatotomy was made at the access site with an 11 scalpel. A 0.018" wire was advanced into the SVC and the access needle exchanged for a 66F micropuncture vascular sheath. The 0.018" wire was then removed and a 0.035" wire advanced into the IVC.  An appropriate location for the subcutaneous reservoir was selected below the clavicle and an incision was made through the skin and underlying soft tissues. The subcutaneous tissues were then dissected using a combination of blunt and sharp surgical technique and a pocket was formed. A single lumen power injectable portacatheter was then tunneled through the subcutaneous tissues from the pocket to the dermatotomy and the port reservoir placed within the subcutaneous pocket.  The venous access site was then serially dilated and a peel away vascular sheath placed over the wire. The wire was removed and the port catheter advanced into position under fluoroscopic guidance. The catheter tip is positioned in the upper right atrium. This was documented with a spot image. The portacatheter was then tested and found to flush and aspirate  well. The port was flushed with saline followed by 100 units/mL heparinized saline.  The pocket was then closed in two layers using first subdermal inverted interrupted absorbable sutures followed by a running subcuticular suture. The epidermis was then sealed with Dermabond. The dermatotomy at the venous access site was also sealed with Dermabond.  COMPLICATIONS: None.  The patient tolerated the procedure well.  IMPRESSION: Status post placement of right-sided IJ port catheter. Catheter ready for use.  Signed,  Dulcy Fanny. Earleen Newport, DO  [facility]  [facility]   Electronically Signed   By: Corrie Mckusick D.O.   On: 04/24/2014 19:02   Ir US Guide Vasc Access Right  04/24/2014   CLINICAL DATA:  64 year old female with a history of lung carcinoma. She has been referred for port catheter placement.  EXAM: IR RIGHT FLOURO GUIDE CV LINE; IR ULTRASOUND GUIDANCE VASC ACCESS RIGHT  Date: 04/24/2014  ANESTHESIA/SEDATION: Moderate (conscious) sedation was administered during this procedure. A total of 2.0 mg Versed and 100 mg Fentanyl were administered intravenously. The patient's vital signs were monitored continuously by radiology nursing throughout the course of the procedure.  Total sedation time: 45 minutes  FLUOROSCOPY TIME:  18 seconds  TECHNIQUE: The right neck and chest was prepped with chlorhexidine, and draped in the usual sterile fashion using maximum barrier technique (cap and mask, sterile gown, sterile gloves, large sterile sheet, hand hygiene and cutaneous antiseptic). Antibiotic prophylaxis was provided with 2.0g Ancef administered IV one hour prior to skin incision. Local anesthesia was attained by infiltration with 1% lidocaine without epinephrine.  Ultrasound demonstrated patency of the right internal jugular vein, and this was documented with an image. Under real-time ultrasound guidance, this vein was accessed with a 21 gauge micropuncture needle and image  documentation was performed.  A small dermatotomy was made at the access site with an 11 scalpel. A 0.018" wire was advanced into the SVC and the access needle exchanged for a 66F micropuncture vascular sheath. The 0.018" wire was then removed and a 0.035" wire advanced into the IVC.  An appropriate location for the subcutaneous reservoir was selected below the clavicle and an incision was made through the skin and underlying soft tissues. The subcutaneous tissues were then dissected using a combination of blunt and sharp surgical technique and a pocket was formed. A single lumen power injectable portacatheter was then tunneled through the subcutaneous tissues from the pocket to the dermatotomy and the port reservoir placed within the subcutaneous pocket.  The venous access site was then serially dilated and a peel away vascular sheath placed over the wire. The wire was removed and the port catheter advanced into position under fluoroscopic guidance. The catheter tip is positioned in the upper right atrium. This was documented with a spot image. The portacatheter was then tested and found to flush and aspirate well. The port was flushed with saline followed by 100 units/mL heparinized saline.  The pocket was then closed in two layers using first subdermal inverted interrupted absorbable sutures followed by a running subcuticular suture. The epidermis was then sealed with Dermabond. The dermatotomy at the venous access site was also sealed with Dermabond.  COMPLICATIONS: None.  The patient tolerated the procedure well.  IMPRESSION: Status post placement of right-sided IJ port catheter. Catheter ready for use.  Signed,  Dulcy Fanny. Earleen Newport, DO  [facility]  [facility]   Electronically Signed   By: Corrie Mckusick D.O.   On: 04/24/2014 19:02     ASSESSMENT/PLAN:  No problem-specific assessment & plan notes found for this encounter.  patient is a pleasant 64 year old  Caucasian female recently diagnosed with extensive stage (T1a, N2, M1 B) small cell lung cancer. She is currently being treated with systemic chemotherapy in the form of carboplatin for AUC of 5 given on day 1 and etoposide at 100 mg/m given on days 1, 2 and 3 with glasses support on day 4. She is status post 1 cycle. Overall she tolerated her first cycle of chemotherapy relatively well with the exception of some fatigue and malaise. Patient was discussed with and also seen by Dr. [name]. 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 These results were discussed with the patient and her son. Overall plan to proceed with the chemotherapy as scheduled and will will refer her to radiation oncology once she completes chemotherapy for treatment of the metastatic brain lesions. She will keep the rescheduled appointment for her PET scan which will complete her staging workup. She'll continue with weekly labs as scheduled and return in 2 weeks prior to the start of cycle #2    Carlton Adam, PA-C 04/27/2014  All questions were answered. The patient knows to call the clinic with any problems, questions or concerns. We can certainly see the patient much sooner if necessary.  ADDENDUM: Hematology/Oncology Attending: I had a face to face encounter with the patient. I recommended her care plan. This is a very pleasant 64 years old white female recently diagnosed with extensive stage small cell lung cancer status post 1 cycle of systemic chemotherapy with carboplatin and etoposide given last week. The patient related the first cycle of her treatment fairly well with no significant adverse effect except for the  persistent fatigue and weakness related to her disease. 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 She was started on Decadron 4 mg by mouth twice a day for the vasogenic edema. We will consider her for whole brain irradiation after  completion of 4 cycles of her chemotherapy unless the patient becomes symptomatic. She missed the appointment for the PET scan earlier today because the patient ate her breakfast. The PET scan is rescheduled for 05/04/2014. I recommended for the patient to continue her current treatment with carboplatin and etoposide as a scheduled. She will come for cycle #2 in 2 weeks. She was advised to call immediately if she has any concerning symptoms in the interval.  Disclaimer: This note was dictated with voice recognition software. Similar sounding words can inadvertently be transcribed and may be missed upon review. Eilleen Kempf., MD 04/29/2014

## 2014-04-28 ENCOUNTER — Other Ambulatory Visit: Payer: Self-pay | Admitting: Adult Health

## 2014-04-28 DIAGNOSIS — C3490 Malignant neoplasm of unspecified part of unspecified bronchus or lung: Secondary | ICD-10-CM

## 2014-04-28 DIAGNOSIS — J449 Chronic obstructive pulmonary disease, unspecified: Secondary | ICD-10-CM

## 2014-04-28 NOTE — Telephone Encounter (Signed)
Received call from [facility] - order for wheelchair received but is missing whether pt needs a standard wheelchair or lightweight.  Pt weighs 100lbs per last ov; order replaced for lightweight wheelchair.  Nothing further needed; will sign off.

## 2014-04-28 NOTE — Addendum Note (Signed)
Addended by: Parke Poisson E on: 04/28/2014 10:48 AM   Modules accepted: Orders

## 2014-04-29 ENCOUNTER — Ambulatory Visit: Payer: Medicare Other | Admitting: Hematology

## 2014-04-29 ENCOUNTER — Ambulatory Visit: Payer: Medicare Other

## 2014-04-29 ENCOUNTER — Other Ambulatory Visit: Payer: Medicare Other

## 2014-04-29 NOTE — Patient Instructions (Signed)
Continue labs and chemotherapy as scheduled Keep the appointment for your PET scan  Follow up in 2 weeks, prior to the start of your next cycle of chemotherapy

## 2014-04-30 ENCOUNTER — Encounter (HOSPITAL_COMMUNITY)
Admission: RE | Admit: 2014-04-30 | Discharge: 2014-04-30 | Disposition: A | Discharge status: Home / Self Care | Payer: Medicare Other | Source: Ambulatory Visit | Attending: Internal Medicine | Admitting: Internal Medicine

## 2014-04-30 ENCOUNTER — Telehealth: Payer: Self-pay | Admitting: *Deleted

## 2014-04-30 DIAGNOSIS — C3492 Malignant neoplasm of unspecified part of left bronchus or lung: Secondary | ICD-10-CM | POA: Insufficient documentation

## 2014-04-30 DIAGNOSIS — Z79899 Other long term (current) drug therapy: Secondary | ICD-10-CM | POA: Insufficient documentation

## 2014-04-30 LAB — GLUCOSE, CAPILLARY: Glucose-Capillary: 105 mg/dL — ABNORMAL HIGH (ref 70–99)

## 2014-04-30 MED ORDER — PANTOPRAZOLE SODIUM 40 MG PO TBEC: 40.0000 mg | DELAYED_RELEASE_TABLET | Freq: Every day | ORAL | Status: DC

## 2014-04-30 MED ORDER — FLUDEOXYGLUCOSE F - 18 (FDG) INJECTION
5.0000 | Freq: Once | INTRAVENOUS | Status: AC | PRN
  Administered 2014-04-30: 5 via INTRAVENOUS

## 2014-04-30 NOTE — Telephone Encounter (Signed)
Son called to see if there is anything that might help the bone pain his mother is experiencing from Neulasta.  Advised Claritin 10 mg once daily for a few days.  She is also having a lot of trouble with GERD which she can normally control with Zantac.  She started on decadron due to brain metastisis.  Spoke with Dr. [name].  OK'd prescription for Protonix 40 mg po q day--prescription electronically prescribed.

## 2014-05-04 ENCOUNTER — Other Ambulatory Visit (HOSPITAL_BASED_OUTPATIENT_CLINIC_OR_DEPARTMENT_OTHER): Payer: Medicare Other

## 2014-05-04 ENCOUNTER — Telehealth: Payer: Self-pay | Admitting: *Deleted

## 2014-05-04 ENCOUNTER — Ambulatory Visit (HOSPITAL_COMMUNITY): Payer: Medicare Other

## 2014-05-04 ENCOUNTER — Other Ambulatory Visit: Payer: Self-pay | Admitting: Medical Oncology

## 2014-05-04 DIAGNOSIS — C7B8 Other secondary neuroendocrine tumors: Secondary | ICD-10-CM

## 2014-05-04 DIAGNOSIS — C3492 Malignant neoplasm of unspecified part of left bronchus or lung: Secondary | ICD-10-CM

## 2014-05-04 DIAGNOSIS — C7A8 Other malignant neuroendocrine tumors: Secondary | ICD-10-CM

## 2014-05-04 DIAGNOSIS — C3490 Malignant neoplasm of unspecified part of unspecified bronchus or lung: Secondary | ICD-10-CM

## 2014-05-04 LAB — COMPREHENSIVE METABOLIC PANEL (CC13)
ALK PHOS: 258 U/L — AB (ref 40–150)
ALT: 60 U/L — ABNORMAL HIGH (ref 0–55)
AST: 29 U/L (ref 5–34)
Albumin: 3.6 g/dL (ref 3.5–5.0)
Anion Gap: 8 mEq/L (ref 3–11)
BUN: 27.3 mg/dL — AB (ref 7.0–26.0)
CO2: 33 mEq/L — ABNORMAL HIGH (ref 22–29)
CREATININE: 0.6 mg/dL (ref 0.6–1.1)
Calcium: 9 mg/dL (ref 8.4–10.4)
Chloride: 95 mEq/L — ABNORMAL LOW (ref 98–109)
Glucose: 167 mg/dl — ABNORMAL HIGH (ref 70–140)
Potassium: 5.1 mEq/L (ref 3.5–5.1)
Sodium: 136 mEq/L (ref 136–145)
Total Protein: 6.5 g/dL (ref 6.4–8.3)

## 2014-05-04 LAB — CBC WITH DIFFERENTIAL/PLATELET
BASO%: 0.2 % (ref 0.0–2.0)
BASOS ABS: 0.1 10*3/uL (ref 0.0–0.1)
EOS%: 0 % (ref 0.0–7.0)
Eosinophils Absolute: 0 10*3/uL (ref 0.0–0.5)
HCT: 36 % (ref 34.8–46.6)
HGB: 11.1 g/dL — ABNORMAL LOW (ref 11.6–15.9)
LYMPH%: 3.4 % — ABNORMAL LOW (ref 14.0–49.7)
MCH: 29.6 pg (ref 25.1–34.0)
MCHC: 30.7 g/dL — ABNORMAL LOW (ref 31.5–36.0)
MCV: 96.2 fL (ref 79.5–101.0)
MONO#: 1.7 10*3/uL — ABNORMAL HIGH (ref 0.1–0.9)
MONO%: 4.2 % (ref 0.0–14.0)
NEUT#: 36.7 10*3/uL — ABNORMAL HIGH (ref 1.5–6.5)
NEUT%: 92.2 % — ABNORMAL HIGH (ref 38.4–76.8)
Platelets: 240 10*3/uL (ref 145–400)
RBC: 3.74 10*6/uL (ref 3.70–5.45)
RDW: 14.9 % — ABNORMAL HIGH (ref 11.2–14.5)
WBC: 39.8 10*3/uL — AB (ref 3.9–10.3)
lymph#: 1.4 10*3/uL (ref 0.9–3.3)

## 2014-05-04 MED ORDER — HYDROCODONE-HOMATROPINE 5-1.5 MG/5ML PO SYRP: 5.0000 mL | ORAL_SOLUTION | Freq: Four times a day (QID) | ORAL | Status: DC | PRN

## 2014-05-04 NOTE — Telephone Encounter (Signed)
Patient's son called to say they are in the lobby and he is concerned that his mother is losing her voice gradually.  Went to the lobby to talk with patient.  She denies a sore throat, she does not have any obvious thrush in her mouth.  She is on continuous dexamethasone due to metastatic disease to her brain.  Assured her the voice loss was probably associated with the dex.  She is running low on her hydrocodone cough medication and wants to know if she can pick up a new Rx for that today.  Spoke with Abelina Bachelor, RN who will get that Rx to her while she is here today.  Advised the patient not to leave without it.  She is scheduled with Susanne Borders, NP on Monday 05/11/14 prior to treatment.

## 2014-05-04 NOTE — Progress Notes (Signed)
rx given to pt

## 2014-05-06 ENCOUNTER — Telehealth: Payer: Self-pay | Admitting: *Deleted

## 2014-05-06 ENCOUNTER — Ambulatory Visit (HOSPITAL_BASED_OUTPATIENT_CLINIC_OR_DEPARTMENT_OTHER): Payer: Medicare Other | Admitting: Nurse Practitioner

## 2014-05-06 ENCOUNTER — Encounter: Payer: Self-pay | Admitting: Nurse Practitioner

## 2014-05-06 VITALS — BP 116/65 | HR 87 | Temp 98.3°F | Resp 15 | Wt 98.3 lb

## 2014-05-06 DIAGNOSIS — C3490 Malignant neoplasm of unspecified part of unspecified bronchus or lung: Secondary | ICD-10-CM

## 2014-05-06 DIAGNOSIS — C7A8 Other malignant neuroendocrine tumors: Secondary | ICD-10-CM

## 2014-05-06 DIAGNOSIS — C7B8 Other secondary neuroendocrine tumors: Secondary | ICD-10-CM

## 2014-05-06 DIAGNOSIS — B029 Zoster without complications: Secondary | ICD-10-CM

## 2014-05-06 MED ORDER — OXYCODONE HCL 5 MG PO TABS: 5.0000 mg | ORAL_TABLET | ORAL | Status: DC | PRN

## 2014-05-06 MED ORDER — VALACYCLOVIR HCL 1 G PO TABS: 1000.0000 mg | ORAL_TABLET | Freq: Three times a day (TID) | ORAL | Status: DC

## 2014-05-06 NOTE — Assessment & Plan Note (Signed)
Patient initiated cycle 1, day 1 of her carboplatin/etoposide chemotherapy regimen on 04/20/2014.Marland Kitchen  She is scheduled to return for cycle 2, day 1 of the same regimen on 05/11/2014.

## 2014-05-06 NOTE — Assessment & Plan Note (Signed)
Patient reports developing one large blister-like rash lesion to her left lower back/buttock region over the past several days.  She does complain of some pruritus and some mild pain to the lesion area.  She denies any recent fevers or chills.  On exam-it does appear that this is a single large shingles lesion to the area directly above the left buttock.  Will prescribe valacyclovir 1000 mg 3 times per day for treatment of shingles.  After a careful discussion with both patient and her son-patient is in agreement to try oxycodone for any potential shingles-like pain.  She was advised to hold any further Vicodin or Hycodan syrup if she does take the oxycodone for pain.  Advised patient and her son would call within the next 48 hours to see how she is doing.  Advised both patient and her son may very well need to hold the chemotherapy scheduled for this coming Monday, 05/11/2014 due to her shingles infection.

## 2014-05-06 NOTE — Progress Notes (Signed)
SYMPTOM MANAGEMENT CLINIC   HPI: Alexa Stephens 64 y.o. female diagnosed with lung cancer; with liver metastasis.  Currently undergoing carboplatin/etoposide chemotherapy regimen.  Patient called the cancer today with request an urgent care visit.  She is complaining of a 3 to four-day history of a single blister--like lesion to the area directly above her left buttock.  She reports that this lesion is slightly pruritic; and also is somewhat painful.  She denies any other new symptoms whatsoever.  She denies any recent fevers or chills.  Patient has a history of chronic COPD; and continues with home O2 as previously her baseline.  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   HPI  ROS  Past Medical History  Diagnosis Date  . Anxiety disorder   . Asthma   . COPD (chronic obstructive pulmonary disease)     Not on home O2 yet but is prescribed.  . Headache(784.0)   . Hyperlipemia   . Hypertension   . Pneumonia Sept 2013  . Sepsis Sept 2013  . Cancer     lung cancer     Past Surgical History  Procedure Laterality Date  . Cosmetic surgery    . Cystectomy    . Hand surgery      has COPD exacerbation; Chronic respiratory failure with hypoxia; Tobacco abuse; Health care maintenance; HTN (hypertension); HLD (hyperlipidemia); Chronic obstructive pulmonary disease (COPD); Migraine headache; Tremor; Constipation; Anemia; Anxiety and depression; Rapid weight loss; Liver masses; Small cell lung carcinoma; Sinusitis; and Shingles on her problem list.    is allergic to benadryl; compazine; excedrin extra strength; flexeril; lexapro; naproxen; skelaxin; and tylenol.    Medication List       This list is accurate as of: 05/06/14  5:42 PM.  Always use your most recent med list.               albuterol (2.5 MG/3ML) 0.083% nebulizer solution  Commonly known as:  PROVENTIL  Inhale 3 mLs into the lungs every 6 (six) hours as needed for wheezing or shortness of  breath.     albuterol 108 (90 BASE) MCG/ACT inhaler  Commonly known as:  PROVENTIL HFA;VENTOLIN HFA  Inhale 2 puffs into the lungs every 4 (four) hours as needed. For shortness of breath.     albuterol 108 (90 BASE) MCG/ACT inhaler  Commonly known as:  PROAIR HFA  Inhale 2 puffs into the lungs every 6 (six) hours as needed for wheezing or shortness of breath.     calcium-vitamin D 500-200 MG-UNIT per tablet  Take 1 tablet by mouth daily. When remembers     clonazePAM 1 MG tablet  Commonly known as:  KLONOPIN  Take 0.5 mg by mouth 2 (two) times daily as needed for anxiety.     clonazePAM 1 MG tablet  Commonly known as:  KLONOPIN  TAKE 1 TABLET BY MOUTH TWICE DAILY AS NEEDED FOR ANXIETY     Ipratropium-Albuterol 20-100 MCG/ACT Aers respimat  Commonly known as:  COMBIVENT  Inhale 1 puff into the lungs every 6 (six) hours.     COMBIVENT RESPIMAT 20-100 MCG/ACT Aers respimat  Generic drug:  Ipratropium-Albuterol  INHALE 1 PUFF BY MOUTH EVERY 6 HOURS     dexamethasone 4 MG tablet  Commonly known as:  DECADRON  Take 1 tablet (4 mg total) by mouth 2 (two) times daily.     Fluticasone Furoate-Vilanterol 100-25 MCG/INH Aepb  Commonly known as:  BREO ELLIPTA  Inhale 1 puff into the lungs daily.     HYDROcodone-acetaminophen 10-325 MG per tablet  Commonly known as:  NORCO  Take 1 tablet by mouth every 6 (six) hours as needed (For pain.).     HYDROcodone-homatropine 5-1.5 MG/5ML syrup  Commonly known as:  HYCODAN  Take 5 mLs by mouth every 6 (six) hours as needed for cough.     ipratropium 0.02 % nebulizer solution  Commonly known as:  ATROVENT  Take 2.5 mLs (500 mcg total) by nebulization every 6 (six) hours as needed (ICD10: J44.9).     lidocaine-prilocaine cream  Commonly known as:  EMLA  Apply 1 application topically as needed.     lisinopril 20 MG tablet  Commonly known as:  PRINIVIL,ZESTRIL  Take 1 tablet (20 mg total) by mouth daily.     multivitamin with minerals  Tabs tablet  Take 1 tablet by mouth daily.     ondansetron 8 MG tablet  Commonly known as:  ZOFRAN  Take 1 tablet (8 mg total) by mouth every 8 (eight) hours as needed for nausea or vomiting.     oxyCODONE 5 MG immediate release tablet  Commonly known as:  Oxy IR/ROXICODONE  Take 1 tablet (5 mg total) by mouth every 4 (four) hours as needed for severe pain.     pantoprazole 40 MG tablet  Commonly known as:  PROTONIX  Take 1 tablet (40 mg total) by mouth daily.     polyethylene glycol packet  Commonly known as:  MIRALAX / GLYCOLAX  Take 17 g by mouth daily.     PRESCRIPTION MEDICATION  She receives chemo treatments at the [facility] with Dr. [name]. She received Carboplatin 328m and Etoposide 1386mon 04/20/14 and Etoposide 13065mn 04/21/14.     prochlorperazine 10 MG tablet  Commonly known as:  COMPAZINE  Take by mouth. Take as directed     rizatriptan 5 MG disintegrating tablet  Commonly known as:  MAXALT-MLT  Take 5 mg by mouth as needed for migraine. May repeat in 2 hours if needed     simvastatin 20 MG tablet  Commonly known as:  ZOCOR  Take 20 mg by mouth daily.     valACYclovir 1000 MG tablet  Commonly known as:  VALTREX  Take 1 tablet (1,000 mg total) by mouth 3 (three) times daily.         PHYSICAL EXAMINATION  Oncology Vitals 05/06/2014 04/27/2014 04/24/2014 04/24/2014 04/24/2014 04/24/2014 04/24/2014  Height - 152 cm 152 cm - - - -  Weight 44.589 kg 44.18 kg 43.999 kg - - - -  Weight (lbs) 98 lbs 5 oz 97 lbs 6 oz 97 lbs - - - -  BMI (kg/m2) - 19.02 kg/m2 18.94 kg/m2 - - - -  Temp 98.3 98.5 - (No Data) 98.8 98.9 -  Pulse 87 91 - 106 101 111 105  Resp 15 18 - 18 18 20 22   SpO2 - 99 - 94 96 93 95  BSA (m2) - 1.37 m2 1.36 m2 - - - -   BP Readings from Last 3 Encounters:  05/06/14 116/65  04/27/14 119/73  04/24/14 117/59    Physical Exam  Constitutional: She is oriented to person, place, and time.  Patient appears weak, frail, and chronically  ill.  HENT:  Head: Normocephalic and atraumatic.  Eyes: Conjunctivae and EOM are normal. Pupils are equal, round, and reactive to light. Right eye exhibits no discharge. Left eye exhibits no discharge. No  scleral icterus.  Neck: Normal range of motion.  Pulmonary/Chest: Effort normal. No respiratory distress.  Musculoskeletal: Normal range of motion.  Neurological: She is alert and oriented to person, place, and time.  Patient in wheelchair; and needs assistance to ambulate to chair for exam.  Skin: Skin is warm and dry. Rash noted. No erythema. There is pallor.  Patient has an approximately 1 cm blister--like lesion to the area directly above her left buttock area.  There is no surrounding erythema, edema, warmth, or red streaks.  Psychiatric: Affect normal.  Nursing note and vitals reviewed.   LABORATORY DATA:. Appointment on 05/04/2014  Component Date Value Ref Range Status  . WBC 05/04/2014 39.8* 3.9 - 10.3 10e3/uL Final  . NEUT# 05/04/2014 36.7* 1.5 - 6.5 10e3/uL Final  . HGB 05/04/2014 11.1* 11.6 - 15.9 g/dL Final  . HCT 05/04/2014 36.0  34.8 - 46.6 % Final  . Platelets 05/04/2014 240  145 - 400 10e3/uL Final  . MCV 05/04/2014 96.2  79.5 - 101.0 fL Final  . MCH 05/04/2014 29.6  25.1 - 34.0 pg Final  . MCHC 05/04/2014 30.7* 31.5 - 36.0 g/dL Final  . RBC 05/04/2014 3.74  3.70 - 5.45 10e6/uL Final  . RDW 05/04/2014 14.9* 11.2 - 14.5 % Final  . lymph# 05/04/2014 1.4  0.9 - 3.3 10e3/uL Final  . MONO# 05/04/2014 1.7* 0.1 - 0.9 10e3/uL Final  . Eosinophils Absolute 05/04/2014 0.0  0.0 - 0.5 10e3/uL Final  . Basophils Absolute 05/04/2014 0.1  0.0 - 0.1 10e3/uL Final  . NEUT% 05/04/2014 92.2* 38.4 - 76.8 % Final  . LYMPH% 05/04/2014 3.4* 14.0 - 49.7 % Final  . MONO% 05/04/2014 4.2  0.0 - 14.0 % Final  . EOS% 05/04/2014 0.0  0.0 - 7.0 % Final  . BASO% 05/04/2014 0.2  0.0 - 2.0 % Final  . Sodium 05/04/2014 136  136 - 145 mEq/L Final  . Potassium 05/04/2014 5.1  3.5 - 5.1 mEq/L Final    . Chloride 05/04/2014 95* 98 - 109 mEq/L Final  . CO2 05/04/2014 33* 22 - 29 mEq/L Final  . Glucose 05/04/2014 167* 70 - 140 mg/dl Final  . BUN 05/04/2014 27.3* 7.0 - 26.0 mg/dL Final  . Creatinine 05/04/2014 0.6  0.6 - 1.1 mg/dL Final  . Total Bilirubin 05/04/2014 <0.20  0.20 - 1.20 mg/dL Final  . Alkaline Phosphatase 05/04/2014 258* 40 - 150 U/L Final  . AST 05/04/2014 29  5 - 34 U/L Final  . ALT 05/04/2014 60* 0 - 55 U/L Final  . Total Protein 05/04/2014 6.5  6.4 - 8.3 g/dL Final  . Albumin 05/04/2014 3.6  3.5 - 5.0 g/dL Final  . Calcium 05/04/2014 9.0  8.4 - 10.4 mg/dL Final  . Anion Gap 05/04/2014 8  3 - 11 mEq/L Final  . EGFR 05/04/2014 >90  >90 ml/min/1.73 m2 Final   eGFR is calculated using the CKD-EPI Creatinine Equation (2009)     RADIOGRAPHIC STUDIES: No results found.  ASSESSMENT/PLAN:    Shingles Patient reports developing one large blister-like rash lesion to her left lower back/buttock region over the past several days.  She does complain of some pruritus and some mild pain to the lesion area.  She denies any recent fevers or chills.  On exam-it does appear that this is a single large shingles lesion to the area directly above the left buttock.  Will prescribe valacyclovir 1000 mg 3 times per day for treatment of shingles.  After a careful discussion with  both patient and her son-patient is in agreement to try oxycodone for any potential shingles-like pain.  She was advised to hold any further Vicodin or Hycodan syrup if she does take the oxycodone for pain.  Advised patient and her son would call within the next 48 hours to see how she is doing.  Advised both patient and her son may very well need to hold the chemotherapy scheduled for this coming Monday, 05/11/2014 due to her shingles infection.   Small cell lung carcinoma Patient initiated cycle 1, day 1 of her carboplatin/etoposide chemotherapy regimen on 04/20/2014.Marland Kitchen  She is scheduled to return for cycle 2, day 1 of  the same regimen on 05/11/2014.   Patient stated understanding of all instructions; and was in agreement with this plan of care. The patient knows to call the clinic with any problems, questions or concerns.   Review/collaboration with Dr. [name] regarding all aspects of patient's visit today.   Total time spent with patient was 25 minutes;  with greater than 75 percent of that time spent in face to face counseling regarding patient's symptoms,  and coordination of care and follow up.  Disclaimer: This note was dictated with voice recognition software. Similar sounding words can inadvertently be transcribed and may not be corrected upon review.   Drue Second, NP 05/06/2014

## 2014-05-06 NOTE — Telephone Encounter (Signed)
Received call from patient's son [name] that patient has an area of blisters to her lower back about 1/2 inch in size, area is reddened and painful. Patient noticed this yesterday. Son reports that numbness in fingers and feet is improved from yesterday. Discussed with Stanton Kidney who will discuss with Dr. [name]. Advised son that we will notify him if patient needs to be seen today.

## 2014-05-06 NOTE — Telephone Encounter (Signed)
Pt's son [name] called triage w/concerns pt having pain/reddness and 1/2in round spots. [name] is unsure if this was from the depends rubbing to that area of skin or if this was something else. Reviewed with Dr. [name]- notified John per MD to have pt sen today in the symptom management clinic. POF to scheduling

## 2014-05-08 ENCOUNTER — Telehealth: Payer: Self-pay | Admitting: *Deleted

## 2014-05-08 NOTE — Telephone Encounter (Signed)
Called patient's son to check on how she is doing.  He said she seems much more like herself this afternoon, but is still sleepy.  They are reassured that she is doing better.

## 2014-05-08 NOTE — Telephone Encounter (Signed)
Patient's son and his significant other called to express concern that Mrs. Dills was "more confused" today.  The son's SO is actually with the patient and she said the patient does not have any unilateral weakness or aphasia.  She has asked the same question three times in a row today and has pushed the button on the dryer a few times causing it to buzz.  She said she gave Ms. Salmela hydrocodone 5mg  at 8:45 am, but she is not aware of what medications she may have taken during the night.  She is pretty sure she took Klonipin.  She said she is dozing off periodically.  Strongly advised that caregiver manage all medications at this point, especially narcotics.  Asked that she re-evaluate the patient regarding her confusion at about 2:00 pm so that we can determine whether or not this may be narcotic-related.  Discussed above with Selena Lesser, NP in Symptom Management Clinic.  If the patient has not improved or has worsened at the 2:00 assessment, she should be advised to go to the emergency room.

## 2014-05-11 ENCOUNTER — Telehealth: Payer: Self-pay | Admitting: Nurse Practitioner

## 2014-05-11 ENCOUNTER — Other Ambulatory Visit (HOSPITAL_BASED_OUTPATIENT_CLINIC_OR_DEPARTMENT_OTHER): Payer: Medicare Other

## 2014-05-11 ENCOUNTER — Ambulatory Visit (HOSPITAL_BASED_OUTPATIENT_CLINIC_OR_DEPARTMENT_OTHER): Payer: Medicare Other | Admitting: Nurse Practitioner

## 2014-05-11 ENCOUNTER — Other Ambulatory Visit: Payer: Self-pay | Admitting: *Deleted

## 2014-05-11 ENCOUNTER — Other Ambulatory Visit: Payer: Medicare Other

## 2014-05-11 ENCOUNTER — Ambulatory Visit (HOSPITAL_BASED_OUTPATIENT_CLINIC_OR_DEPARTMENT_OTHER): Payer: Medicare Other

## 2014-05-11 VITALS — BP 103/61 | HR 102 | Temp 98.4°F | Resp 22 | Ht 60.0 in | Wt 98.6 lb

## 2014-05-11 DIAGNOSIS — C7A8 Other malignant neuroendocrine tumors: Secondary | ICD-10-CM

## 2014-05-11 DIAGNOSIS — C7B8 Other secondary neuroendocrine tumors: Secondary | ICD-10-CM

## 2014-05-11 DIAGNOSIS — C3492 Malignant neoplasm of unspecified part of left bronchus or lung: Secondary | ICD-10-CM

## 2014-05-11 DIAGNOSIS — Z5111 Encounter for antineoplastic chemotherapy: Secondary | ICD-10-CM

## 2014-05-11 DIAGNOSIS — C3490 Malignant neoplasm of unspecified part of unspecified bronchus or lung: Secondary | ICD-10-CM

## 2014-05-11 LAB — CBC WITH DIFFERENTIAL/PLATELET
BASO%: 0.1 % (ref 0.0–2.0)
BASOS ABS: 0 10*3/uL (ref 0.0–0.1)
EOS%: 0.1 % (ref 0.0–7.0)
Eosinophils Absolute: 0 10*3/uL (ref 0.0–0.5)
HEMATOCRIT: 38.8 % (ref 34.8–46.6)
HEMOGLOBIN: 12.4 g/dL (ref 11.6–15.9)
LYMPH#: 2.3 10*3/uL (ref 0.9–3.3)
LYMPH%: 8.4 % — ABNORMAL LOW (ref 14.0–49.7)
MCH: 31.5 pg (ref 25.1–34.0)
MCHC: 32 g/dL (ref 31.5–36.0)
MCV: 98.5 fL (ref 79.5–101.0)
MONO#: 1.7 10*3/uL — ABNORMAL HIGH (ref 0.1–0.9)
MONO%: 6.5 % (ref 0.0–14.0)
NEUT#: 22.7 10*3/uL — ABNORMAL HIGH (ref 1.5–6.5)
NEUT%: 84.9 % — ABNORMAL HIGH (ref 38.4–76.8)
Platelets: 328 10*3/uL (ref 145–400)
RBC: 3.94 10*6/uL (ref 3.70–5.45)
RDW: 16.7 % — AB (ref 11.2–14.5)
WBC: 26.8 10*3/uL — AB (ref 3.9–10.3)

## 2014-05-11 LAB — COMPREHENSIVE METABOLIC PANEL (CC13)
ALBUMIN: 3 g/dL — AB (ref 3.5–5.0)
ALT: 44 U/L (ref 0–55)
AST: 22 U/L (ref 5–34)
Alkaline Phosphatase: 162 U/L — ABNORMAL HIGH (ref 40–150)
Anion Gap: 9 mEq/L (ref 3–11)
BUN: 25.5 mg/dL (ref 7.0–26.0)
CO2: 36 mEq/L — ABNORMAL HIGH (ref 22–29)
Calcium: 9.4 mg/dL (ref 8.4–10.4)
Chloride: 95 mEq/L — ABNORMAL LOW (ref 98–109)
Creatinine: 0.6 mg/dL (ref 0.6–1.1)
EGFR: >90 mL/min/{1.73_m2} (ref >90)
Glucose: 92 mg/dl (ref 70–140)
POTASSIUM: 4.5 meq/L (ref 3.5–5.1)
Sodium: 140 mEq/L (ref 136–145)
Total Bilirubin: 0.32 mg/dL (ref 0.20–1.20)
Total Protein: 6.5 g/dL (ref 6.4–8.3)

## 2014-05-11 MED ORDER — ALBUTEROL SULFATE HFA 108 (90 BASE) MCG/ACT IN AERS: 2.0000 | INHALATION_SPRAY | Freq: Four times a day (QID) | RESPIRATORY_TRACT | Status: AC | PRN

## 2014-05-11 MED ORDER — IPRATROPIUM-ALBUTEROL 20-100 MCG/ACT IN AERS: INHALATION_SPRAY | RESPIRATORY_TRACT | Status: AC

## 2014-05-11 MED ORDER — LIDOCAINE-PRILOCAINE 2.5-2.5 % EX CREA: 1.0000 "application " | TOPICAL_CREAM | CUTANEOUS | Status: DC | PRN

## 2014-05-11 MED ORDER — SODIUM CHLORIDE 0.9 % IV SOLN
Freq: Once | INTRAVENOUS | Status: AC
  Administered 2014-05-11: 14:00:00 via INTRAVENOUS

## 2014-05-11 MED ORDER — SODIUM CHLORIDE 0.9 % IV SOLN
309.5000 mg | Freq: Once | INTRAVENOUS | Status: AC
  Administered 2014-05-11: 310 mg via INTRAVENOUS
  Filled 2014-05-11: qty 31

## 2014-05-11 MED ORDER — LIDOCAINE-PRILOCAINE 2.5-2.5 % EX CREA: 1.0000 "application " | TOPICAL_CREAM | CUTANEOUS | Status: AC | PRN

## 2014-05-11 MED ORDER — DEXAMETHASONE 4 MG PO TABS: 4.0000 mg | ORAL_TABLET | Freq: Two times a day (BID) | ORAL | Status: DC

## 2014-05-11 MED ORDER — ETOPOSIDE CHEMO INJECTION 1 GM/50ML
100.0000 mg/m2 | Freq: Once | INTRAVENOUS | Status: AC
  Administered 2014-05-11: 130 mg via INTRAVENOUS
  Filled 2014-05-11: qty 6.5

## 2014-05-11 MED ORDER — SODIUM CHLORIDE 0.9 % IV SOLN
Freq: Once | INTRAVENOUS | Status: AC
  Administered 2014-05-11: 14:00:00 via INTRAVENOUS
  Filled 2014-05-11: qty 8

## 2014-05-11 NOTE — Patient Instructions (Signed)
[facility] Discharge Instructions for Patients Receiving Chemotherapy  Today you received the following chemotherapy agents: Carboplatin, Etoposide  To help prevent nausea and vomiting after your treatment, we encourage you to take your nausea medication as prescribed by your physician.   If you develop nausea and vomiting that is not controlled by your nausea medication, call the clinic.   BELOW ARE SYMPTOMS THAT SHOULD BE REPORTED IMMEDIATELY:  *FEVER GREATER THAN 100.5 F  *CHILLS WITH OR WITHOUT FEVER  NAUSEA AND VOMITING THAT IS NOT CONTROLLED WITH YOUR NAUSEA MEDICATION  *UNUSUAL SHORTNESS OF BREATH  *UNUSUAL BRUISING OR BLEEDING  TENDERNESS IN MOUTH AND THROAT WITH OR WITHOUT PRESENCE OF ULCERS  *URINARY PROBLEMS  *BOWEL PROBLEMS  UNUSUAL RASH Items with * indicate a potential emergency and should be followed up as soon as possible.  Feel free to call the clinic you have any questions or concerns. The clinic phone number is [phone].

## 2014-05-11 NOTE — Progress Notes (Signed)
No images are attached to the encounter. No scans are attached to the encounter. No scans are attached to the encounter. [facility] VISIT PROGRESS NOTE  [name], [place] [address] [place] [address]  DIAGNOSIS: Small cell lung carcinoma   Staging form: Lung, AJCC 7th Edition     Clinical stage from 04/18/2014: Stage IV (T1a, N2, M1b) - Signed by Curt Bears, MD on 04/18/2014       Staging comments: Extensive stage small cell lung cancer  PRIOR THERAPY: None  CURRENT THERAPY: Systemic chemotherapy with carboplatin for an ECF 5 given on day 1 and etoposide at 100 mg/m given on days 1, 2 and 3 with Neulasta support given on day 4. Status post 1 cycle.  INTERVAL HISTORY: Alexa Stephens 64 y.o. female returns for a scheduled regular and management visit for followup of her recently diagnosed extensive stage small cell lung cancer. She is due for cycle 2 of carboplatin and etoposide today. She had a PET scan on 04/30/14 that was comparative to her most recent CT scans but also shows a lesion the head of her left femur. This is a source of pain for her at times. She is wearing 3LO2 and has a productive cough. She coughs up the most first thing in the morning and this can be yellow-light brown. The phlegm lightens up throughout the day. She has pain when she takes a deep breath. She complains of shortness of breath, but the prescribed inhalers help. She is having regular bowel movements with the help of Boost supplement shakes. She was diagnosed with shingles last Wednesday and started on a high dose course of valacyclovir. The lesion has now crusted over and no longer painful or pruritic.    MEDICAL HISTORY: Past Medical History  Diagnosis Date  . Anxiety disorder   . Asthma   . COPD (chronic obstructive pulmonary disease)     Not on home O2 yet but is prescribed.  . Headache(784.0)   . Hyperlipemia   . Hypertension   . Pneumonia Sept 2013  . Sepsis Sept 2013   . Cancer     lung cancer     ALLERGIES:  is allergic to benadryl; compazine; excedrin extra strength; flexeril; lexapro; naproxen; skelaxin; and tylenol.  MEDICATIONS:  Current Outpatient Prescriptions  Medication Sig Dispense Refill  . albuterol (PROVENTIL) (2.5 MG/3ML) 0.083% nebulizer solution Inhale 3 mLs into the lungs every 6 (six) hours as needed for wheezing or shortness of breath.   12  . Calcium Carbonate-Vitamin D (CALCIUM-VITAMIN D) 500-200 MG-UNIT per tablet Take 1 tablet by mouth daily. When remembers    . clonazePAM (KLONOPIN) 1 MG tablet Take 0.5 mg by mouth 2 (two) times daily as needed for anxiety.    . Fluticasone Furoate-Vilanterol (BREO ELLIPTA) 100-25 MCG/INH AEPB Inhale 1 puff into the lungs daily. 28 each 5  . HYDROcodone-homatropine (HYCODAN) 5-1.5 MG/5ML syrup Take 5 mLs by mouth every 6 (six) hours as needed for cough. 120 mL 0  . ipratropium (ATROVENT) 0.02 % nebulizer solution Take 2.5 mLs (500 mcg total) by nebulization every 6 (six) hours as needed (ICD10: J44.9). 75 mL 5  . Ipratropium-Albuterol (COMBIVENT) 20-100 MCG/ACT AERS respimat Inhale 1 puff into the lungs every 6 (six) hours.    . Multiple Vitamin (MULTIVITAMIN WITH MINERALS) TABS tablet Take 1 tablet by mouth daily.    . ondansetron (ZOFRAN) 8 MG tablet Take 1 tablet (8 mg total) by mouth every 8 (eight)  hours as needed for nausea or vomiting. 20 tablet 0  . pantoprazole (PROTONIX) 40 MG tablet Take 1 tablet (40 mg total) by mouth daily. 30 tablet 1  . PRESCRIPTION MEDICATION She receives chemo treatments at the [facility] with Dr. [name]. She received Carboplatin 310mg  and Etoposide 130mg  on 04/20/14 and Etoposide 130mg  on 04/21/14.    . simvastatin (ZOCOR) 20 MG tablet Take 20 mg by mouth daily.    . valACYclovir (VALTREX) 1000 MG tablet Take 1 tablet (1,000 mg total) by mouth 3 (three) times daily. 30 tablet 0  . albuterol (PROAIR HFA) 108 (90 BASE) MCG/ACT inhaler Inhale 2 puffs into  the lungs every 6 (six) hours as needed for wheezing or shortness of breath. 3 Inhaler 1  . albuterol (PROVENTIL HFA;VENTOLIN HFA) 108 (90 BASE) MCG/ACT inhaler Inhale 2 puffs into the lungs every 4 (four) hours as needed. For shortness of breath. (Patient not taking: Reported on 05/11/2014) 1 Inhaler 5  . clonazePAM (KLONOPIN) 1 MG tablet TAKE 1 TABLET BY MOUTH TWICE DAILY AS NEEDED FOR ANXIETY (Patient not taking: Reported on 05/11/2014) 60 tablet 0  . dexamethasone (DECADRON) 4 MG tablet Take 1 tablet (4 mg total) by mouth 2 (two) times daily. 60 tablet 0  . HYDROcodone-acetaminophen (NORCO) 10-325 MG per tablet Take 1 tablet by mouth every 6 (six) hours as needed (For pain.).   0  . Ipratropium-Albuterol (COMBIVENT RESPIMAT) 20-100 MCG/ACT AERS respimat INHALE 1 PUFF BY MOUTH EVERY 6 HOURS. 4 g 0  . lidocaine-prilocaine (EMLA) cream Apply 1 application topically as needed. 30 g 2  . lisinopril (PRINIVIL,ZESTRIL) 20 MG tablet Take 1 tablet (20 mg total) by mouth daily. 90 tablet 3  . oxyCODONE (OXY IR/ROXICODONE) 5 MG immediate release tablet Take 1 tablet (5 mg total) by mouth every 4 (four) hours as needed for severe pain. (Patient not taking: Reported on 05/11/2014) 30 tablet 0  . polyethylene glycol (MIRALAX / GLYCOLAX) packet Take 17 g by mouth daily.    . rizatriptan (MAXALT-MLT) 5 MG disintegrating tablet Take 5 mg by mouth as needed for migraine. May repeat in 2 hours if needed     No current facility-administered medications for this visit.    SURGICAL HISTORY:  Past Surgical History  Procedure Laterality Date  . Cosmetic surgery    . Cystectomy    . Hand surgery      REVIEW OF SYSTEMS:  Review of Systems  Constitutional: Positive for weight loss and malaise/fatigue.  HENT: Negative.   Eyes: Negative.   Respiratory: Positive for cough, sputum production and shortness of breath.   Cardiovascular: Negative.   Gastrointestinal: Negative.   Genitourinary: Negative.    Musculoskeletal:       Pain with inspiration. Chest wall pain.   Skin: Negative.   Neurological: Positive for weakness.  Endo/Heme/Allergies: Negative.   Psychiatric/Behavioral: The patient is nervous/anxious.      PHYSICAL EXAMINATION: Physical Exam  Constitutional: She is oriented to person, place, and time and well-developed, well-nourished, and in no distress.  Comfortably seated in a wheelchair on oxygen via nasal cannula  HENT:  Head: Normocephalic and atraumatic.  Mouth/Throat: Oropharynx is clear and moist.  Eyes: Pupils are equal, round, and reactive to light.  Neck: Normal range of motion. Neck supple. No JVD present. No tracheal deviation present. No thyromegaly present.  Cardiovascular: Normal rate, regular rhythm, normal heart sounds and intact distal pulses.  Exam reveals no gallop and no friction rub.   No murmur  heard. Pulmonary/Chest: Effort normal and breath sounds normal. No respiratory distress. She has no wheezes. She has no rales.  Abdominal: Soft. Bowel sounds are normal. She exhibits no distension and no mass. There is no tenderness.  Musculoskeletal: Normal range of motion. She exhibits no edema or tenderness.  Lymphadenopathy:    She has no cervical adenopathy.  Neurological: She is alert and oriented to person, place, and time. She has normal reflexes.  Skin: Skin is warm and dry. No rash noted.    ECOG PERFORMANCE STATUS: 2 - Symptomatic, <50% confined to bed  Blood pressure 103/61, pulse 102, temperature 98.4 F (36.9 C), temperature source Oral, resp. rate 22, height 5' (1.524 m), weight 98 lb 9.6 oz (44.725 kg), SpO2 97 %.  LABORATORY DATA: Lab Results  Component Value Date   WBC 26.8* 05/11/2014   HGB 12.4 05/11/2014   HCT 38.8 05/11/2014   MCV 98.5 05/11/2014   PLT 328 05/11/2014      Chemistry      Component Value Date/Time   NA 140 05/11/2014 1051   NA 141 04/09/2014 1120   K 4.5 05/11/2014 1051   K 3.5 04/09/2014 1120   CL 97  04/09/2014 1120   CO2 36* 05/11/2014 1051   CO2 38* 04/09/2014 1120   BUN 25.5 05/11/2014 1051   BUN 12 04/09/2014 1120   CREATININE 0.6 05/11/2014 1051   CREATININE 0.38* 04/09/2014 1120   CREATININE 0.45* 04/02/2014 1642      Component Value Date/Time   CALCIUM 9.4 05/11/2014 1051   CALCIUM 9.3 04/09/2014 1120   ALKPHOS 162* 05/11/2014 1051   ALKPHOS 103 04/09/2014 1120   AST 22 05/11/2014 1051   AST 39* 04/09/2014 1120   ALT 44 05/11/2014 1051   ALT 24 04/09/2014 1120   BILITOT 0.32 05/11/2014 1051   BILITOT 0.6 04/09/2014 1120       RADIOGRAPHIC STUDIES:  Mr Jeri Cos Wo Contrast  04/25/2014   CLINICAL DATA:  Small cell lung cancer.  EXAM: MRI HEAD WITHOUT AND WITH CONTRAST  TECHNIQUE: Multiplanar, multiecho pulse sequences of the brain and surrounding structures were obtained without and with intravenous contrast.  CONTRAST:  51mL MULTIHANCE GADOBENATE DIMEGLUMINE 529 MG/ML IV SOLN  COMPARISON:  CT head without contrast 07/12/2013  FINDINGS: There is a ring-enhancing lesion in the lateral right cerebellum measures 11 x 12 x 8 mm. A peripherally enhancing lesion along the superior right temporal gyrus measures 6 x 7 x 8 mm. A peripherally enhancing lesion in the right coronal radiata measures 5 x 6 x 7 mm. A 5 mm lesion is present in the left cerebellum. There is restricted diffusion associated with each of these lesions, compatible with small cell lung cancer metastases.  A 6 mm enhancing lesion anteriorly in the inferior right cerebellum does not exhibit significant restricted diffusion.  There is moderate vasogenic edema associated with each of these lesions.  Extensive periventricular and subcortical white matter disease is separately noted. There is diffuse white matter change within the central pons.  Flow is present in the major intracranial arteries. The patient is status post bilateral lens replacements.  Remote ischemic changes are present within the basal ganglia bilaterally.   Mild mucosal thickening is present in the anterior ethmoid air cells bilaterally. The paranasal sinuses and mastoid air cells are clear.  IMPRESSION: 1. Enhancing metastases are noted within the right coronal radiata, the right superior temporal gyrus, in the cerebellum bilaterally. Two cerebellar lesions are present on the right and  one on the left. 2. These lesions exhibit restricted diffusion and surrounding vasogenic edema compatible with metastatic disease of small cell lung cancer. 3. The patient also has extensive chronic white matter disease reflecting the sequelae of microvascular ischemia. There is extensive white matter disease within the brainstem. 4. Minimal anterior ethmoid sinus disease.   Electronically Signed   By: San Morelle M.D.   On: 04/25/2014 14:36   Nm Pet Image Initial (pi) Skull Base To Thigh  04/30/2014   CLINICAL DATA:  Initial treatment strategy for staging of small cell lung carcinoma.  EXAM: NUCLEAR MEDICINE PET SKULL BASE TO THIGH  TECHNIQUE: 5.0 mCi F-18 FDG was injected intravenously. Full-ring PET imaging was performed from the skull base to thigh after the radiotracer. CT data was obtained and used for attenuation correction and anatomic localization.  FASTING BLOOD GLUCOSE:  Value: One hundred five mg/dl  COMPARISON:  04/03/2014 chest, abdomen, and pelvic CTs.  FINDINGS: NECK  Mild hypermetabolism centered about the right piriform sinus, without CT correlate. This measures a S.U.V. max of 5.7 on image 29.  CHEST  Low left jugular/supraclavicular hypermetabolic adenopathy. An index node measures 1.4 cm and a S.U.V. max of 3.9 on image 36.  Extensive mediastinal hypermetabolic adenopathy. Index right paratracheal node measures 1.0 cm and a S.U.V. max of 4.4.  Central left lower lobe lung mass versus adenopathy. Positioned lateral to the origin of the left lower lobe bronchus. Measures on the order of 2.3 cm and a S.U.V. max of 5.0 on image 67.  ABDOMEN/PELVIS  Bilateral  hypermetabolic hepatic metastasis. Index lesion straddling the medial and lateral segments of the left lobe measures 5.1 x 3.7 cm and a S.U.V. max of 5.9. Central photopenia, consistent with necrosis.  SKELETON  Left proximal femoral metastasis at the junction of the neck and shaft. Measures a S.U.V. max of 7.3, including on image 170.  CT IMAGES PERFORMED FOR ATTENUATION CORRECTION  No further findings within the neck. Chest, abdomen, and pelvic findings deferred to recent diagnostic CTs. Trace pericardial fluid is likely physiologic. Advanced centrilobular emphysema. Posterior right upper lobe pulmonary nodule which is likely below the resolution of PET on image 57 of series 4. Patchy left lower lobe opacities, primarily felt to represent postobstructive pneumonitis. These are similar to slightly improved. Colonic stool burden suggests constipation. Mild osteopenia.  IMPRESSION: 1. Widespread metastatic disease, including to cervical nodes, thoracic nodes, liver, and bones. Primary is favored to be identified along the lateral aspect of the left lower lobe bronchus. 2. Right piriform sinus hypermetabolism, without CT correlate. Favor physiologic. Recommend attention on follow-up.   Electronically Signed   By: Abigail Miyamoto M.D.   On: 04/30/2014 14:10   Ir Fluoro Guide Cv Line Right  04/24/2014   CLINICAL DATA:  64 year old female with a history of lung carcinoma. She has been referred for port catheter placement.  EXAM: IR RIGHT FLOURO GUIDE CV LINE; IR ULTRASOUND GUIDANCE VASC ACCESS RIGHT  Date: 04/24/2014  ANESTHESIA/SEDATION: Moderate (conscious) sedation was administered during this procedure. A total of 2.0 mg Versed and 100 mg Fentanyl were administered intravenously. The patient's vital signs were monitored continuously by radiology nursing throughout the course of the procedure.  Total sedation time: 45 minutes  FLUOROSCOPY TIME:  18 seconds  TECHNIQUE: The right neck and chest was prepped with  chlorhexidine, and draped in the usual sterile fashion using maximum barrier technique (cap and mask, sterile gown, sterile gloves, large sterile sheet, hand hygiene and cutaneous antiseptic). Antibiotic  prophylaxis was provided with 2.0g Ancef administered IV one hour prior to skin incision. Local anesthesia was attained by infiltration with 1% lidocaine without epinephrine.  Ultrasound demonstrated patency of the right internal jugular vein, and this was documented with an image. Under real-time ultrasound guidance, this vein was accessed with a 21 gauge micropuncture needle and image documentation was performed. A small dermatotomy was made at the access site with an 11 scalpel. A 0.018" wire was advanced into the SVC and the access needle exchanged for a 17F micropuncture vascular sheath. The 0.018" wire was then removed and a 0.035" wire advanced into the IVC.  An appropriate location for the subcutaneous reservoir was selected below the clavicle and an incision was made through the skin and underlying soft tissues. The subcutaneous tissues were then dissected using a combination of blunt and sharp surgical technique and a pocket was formed. A single lumen power injectable portacatheter was then tunneled through the subcutaneous tissues from the pocket to the dermatotomy and the port reservoir placed within the subcutaneous pocket.  The venous access site was then serially dilated and a peel away vascular sheath placed over the wire. The wire was removed and the port catheter advanced into position under fluoroscopic guidance. The catheter tip is positioned in the upper right atrium. This was documented with a spot image. The portacatheter was then tested and found to flush and aspirate well. The port was flushed with saline followed by 100 units/mL heparinized saline.  The pocket was then closed in two layers using first subdermal inverted interrupted absorbable sutures followed by a running subcuticular  suture. The epidermis was then sealed with Dermabond. The dermatotomy at the venous access site was also sealed with Dermabond.  COMPLICATIONS: None.  The patient tolerated the procedure well.  IMPRESSION: Status post placement of right-sided IJ port catheter. Catheter ready for use.  Signed,  Dulcy Fanny. Earleen Newport, DO  [facility]  [facility]   Electronically Signed   By: Corrie Mckusick D.O.   On: 04/24/2014 19:02   Ir US Guide Vasc Access Right  04/24/2014   CLINICAL DATA:  64 year old female with a history of lung carcinoma. She has been referred for port catheter placement.  EXAM: IR RIGHT FLOURO GUIDE CV LINE; IR ULTRASOUND GUIDANCE VASC ACCESS RIGHT  Date: 04/24/2014  ANESTHESIA/SEDATION: Moderate (conscious) sedation was administered during this procedure. A total of 2.0 mg Versed and 100 mg Fentanyl were administered intravenously. The patient's vital signs were monitored continuously by radiology nursing throughout the course of the procedure.  Total sedation time: 45 minutes  FLUOROSCOPY TIME:  18 seconds  TECHNIQUE: The right neck and chest was prepped with chlorhexidine, and draped in the usual sterile fashion using maximum barrier technique (cap and mask, sterile gown, sterile gloves, large sterile sheet, hand hygiene and cutaneous antiseptic). Antibiotic prophylaxis was provided with 2.0g Ancef administered IV one hour prior to skin incision. Local anesthesia was attained by infiltration with 1% lidocaine without epinephrine.  Ultrasound demonstrated patency of the right internal jugular vein, and this was documented with an image. Under real-time ultrasound guidance, this vein was accessed with a 21 gauge micropuncture needle and image documentation was performed. A small dermatotomy was made at the access site with an 11 scalpel. A 0.018" wire was advanced into the SVC and the access needle exchanged for a 17F micropuncture vascular sheath. The 0.018" wire  was then removed and a 0.035" wire advanced into the IVC.  An appropriate  location for the subcutaneous reservoir was selected below the clavicle and an incision was made through the skin and underlying soft tissues. The subcutaneous tissues were then dissected using a combination of blunt and sharp surgical technique and a pocket was formed. A single lumen power injectable portacatheter was then tunneled through the subcutaneous tissues from the pocket to the dermatotomy and the port reservoir placed within the subcutaneous pocket.  The venous access site was then serially dilated and a peel away vascular sheath placed over the wire. The wire was removed and the port catheter advanced into position under fluoroscopic guidance. The catheter tip is positioned in the upper right atrium. This was documented with a spot image. The portacatheter was then tested and found to flush and aspirate well. The port was flushed with saline followed by 100 units/mL heparinized saline.  The pocket was then closed in two layers using first subdermal inverted interrupted absorbable sutures followed by a running subcuticular suture. The epidermis was then sealed with Dermabond. The dermatotomy at the venous access site was also sealed with Dermabond.  COMPLICATIONS: None.  The patient tolerated the procedure well.  IMPRESSION: Status post placement of right-sided IJ port catheter. Catheter ready for use.  Signed,  Dulcy Fanny. Earleen Newport, DO  [facility]  [facility]   Electronically Signed   By: Corrie Mckusick D.O.   On: 04/24/2014 19:02     ASSESSMENT/PLAN:  No problem-specific assessment & plan notes found for this encounter. The patient is a pleasant 64 year old Caucasian female recently diagnosed with extensive stage (T1a, N2, M1 B) small cell lung cancer. She is currently being treated with systemic chemotherapy in the form of carboplatin for AUC of 5 given on day 1 and etoposide at  100 mg/m given on days 1, 2 and 3 with neulasta support on day 4. She is status post 1 cycle. 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 She was started on Decadron 4 mg by mouth twice a day for the vasogenic edema. She will proceed with cycle 2 of treatment today.  We reviewed the results of th PET scan, which was comparable to the CT, but additionally showed a lesion to the left femur head.  She will return in 3 weeks for the start of cycle 3.  We will be consider her for whole brain radiation after the completion of 4 cycles, unless she become symptomatic.  She understands and agrees with this plan. She has been encouraged to call with any issues that might arise before her next visit here.    Laurie Panda, NP 05/11/2014

## 2014-05-11 NOTE — Telephone Encounter (Signed)
appts made and avs printed for pt  Alexa Stephens °

## 2014-05-12 ENCOUNTER — Encounter: Payer: Self-pay | Admitting: Nurse Practitioner

## 2014-05-12 ENCOUNTER — Ambulatory Visit (HOSPITAL_BASED_OUTPATIENT_CLINIC_OR_DEPARTMENT_OTHER): Payer: Medicare Other

## 2014-05-12 DIAGNOSIS — C7A8 Other malignant neuroendocrine tumors: Secondary | ICD-10-CM

## 2014-05-12 DIAGNOSIS — C3492 Malignant neoplasm of unspecified part of left bronchus or lung: Secondary | ICD-10-CM

## 2014-05-12 DIAGNOSIS — C7B8 Other secondary neuroendocrine tumors: Secondary | ICD-10-CM

## 2014-05-12 DIAGNOSIS — Z5111 Encounter for antineoplastic chemotherapy: Secondary | ICD-10-CM

## 2014-05-12 MED ORDER — SODIUM CHLORIDE 0.9 % IV SOLN
100.0000 mg/m2 | Freq: Once | INTRAVENOUS | Status: AC
  Administered 2014-05-12: 130 mg via INTRAVENOUS
  Filled 2014-05-12: qty 6.5

## 2014-05-12 MED ORDER — PROCHLORPERAZINE MALEATE 10 MG PO TABS
10.0000 mg | ORAL_TABLET | Freq: Once | ORAL | Status: AC
  Administered 2014-05-12: 10 mg via ORAL

## 2014-05-12 MED ORDER — SODIUM CHLORIDE 0.9 % IV SOLN
Freq: Once | INTRAVENOUS | Status: AC
  Administered 2014-05-12: 14:00:00 via INTRAVENOUS

## 2014-05-12 MED ORDER — PROCHLORPERAZINE MALEATE 10 MG PO TABS
ORAL_TABLET | ORAL | Status: AC
  Filled 2014-05-12: qty 1

## 2014-05-12 MED ORDER — HEPARIN SOD (PORK) LOCK FLUSH 100 UNIT/ML IV SOLN
500.0000 [IU] | Freq: Once | INTRAVENOUS | Status: AC | PRN
  Administered 2014-05-12: 500 [IU]
  Filled 2014-05-12: qty 5

## 2014-05-12 MED ORDER — SODIUM CHLORIDE 0.9 % IJ SOLN
10.0000 mL | INTRAMUSCULAR | Status: DC | PRN
  Administered 2014-05-12: 10 mL
  Filled 2014-05-12: qty 10

## 2014-05-12 NOTE — Patient Instructions (Signed)
[facility] Discharge Instructions for Patients Receiving Chemotherapy  Today you received the following chemotherapy agents; Etoposide  To help prevent nausea and vomiting after your treatment, we encourage you to take your nausea medication as directed.    If you develop nausea and vomiting that is not controlled by your nausea medication, call the clinic.   BELOW ARE SYMPTOMS THAT SHOULD BE REPORTED IMMEDIATELY:  *FEVER GREATER THAN 100.5 F  *CHILLS WITH OR WITHOUT FEVER  NAUSEA AND VOMITING THAT IS NOT CONTROLLED WITH YOUR NAUSEA MEDICATION  *UNUSUAL SHORTNESS OF BREATH  *UNUSUAL BRUISING OR BLEEDING  TENDERNESS IN MOUTH AND THROAT WITH OR WITHOUT PRESENCE OF ULCERS  *URINARY PROBLEMS  *BOWEL PROBLEMS  UNUSUAL RASH Items with * indicate a potential emergency and should be followed up as soon as possible.  Feel free to call the clinic you have any questions or concerns. The clinic phone number is [phone].

## 2014-05-13 ENCOUNTER — Ambulatory Visit (HOSPITAL_BASED_OUTPATIENT_CLINIC_OR_DEPARTMENT_OTHER): Payer: Medicare Other

## 2014-05-13 DIAGNOSIS — C3492 Malignant neoplasm of unspecified part of left bronchus or lung: Secondary | ICD-10-CM

## 2014-05-13 DIAGNOSIS — C7A8 Other malignant neuroendocrine tumors: Secondary | ICD-10-CM

## 2014-05-13 DIAGNOSIS — Z5111 Encounter for antineoplastic chemotherapy: Secondary | ICD-10-CM

## 2014-05-13 DIAGNOSIS — C7B8 Other secondary neuroendocrine tumors: Secondary | ICD-10-CM

## 2014-05-13 MED ORDER — SODIUM CHLORIDE 0.9 % IV SOLN
100.0000 mg/m2 | Freq: Once | INTRAVENOUS | Status: AC
  Administered 2014-05-13: 130 mg via INTRAVENOUS
  Filled 2014-05-13: qty 6.5

## 2014-05-13 MED ORDER — PROCHLORPERAZINE MALEATE 10 MG PO TABS
ORAL_TABLET | ORAL | Status: AC
  Filled 2014-05-13: qty 1

## 2014-05-13 MED ORDER — FLUCONAZOLE 100 MG PO TABS: 100.0000 mg | ORAL_TABLET | Freq: Every day | ORAL | Status: DC

## 2014-05-13 MED ORDER — SODIUM CHLORIDE 0.9 % IJ SOLN
10.0000 mL | INTRAMUSCULAR | Status: DC | PRN
  Administered 2014-05-13: 10 mL
  Filled 2014-05-13: qty 10

## 2014-05-13 MED ORDER — SODIUM CHLORIDE 0.9 % IV SOLN
Freq: Once | INTRAVENOUS | Status: AC
  Administered 2014-05-13: 13:00:00 via INTRAVENOUS

## 2014-05-13 MED ORDER — PROCHLORPERAZINE MALEATE 10 MG PO TABS
10.0000 mg | ORAL_TABLET | Freq: Once | ORAL | Status: AC
  Administered 2014-05-13: 10 mg via ORAL

## 2014-05-13 MED ORDER — HEPARIN SOD (PORK) LOCK FLUSH 100 UNIT/ML IV SOLN
500.0000 [IU] | Freq: Once | INTRAVENOUS | Status: AC | PRN
  Administered 2014-05-13: 500 [IU]
  Filled 2014-05-13: qty 5

## 2014-05-13 NOTE — Patient Instructions (Signed)
[facility] Discharge Instructions for Patients Receiving Chemotherapy  Today you received the following chemotherapy agents Etoposide.  To help prevent nausea and vomiting after your treatment, we encourage you to take your nausea medication as prescribed.   If you develop nausea and vomiting that is not controlled by your nausea medication, call the clinic.   BELOW ARE SYMPTOMS THAT SHOULD BE REPORTED IMMEDIATELY:  *FEVER GREATER THAN 100.5 F  *CHILLS WITH OR WITHOUT FEVER  NAUSEA AND VOMITING THAT IS NOT CONTROLLED WITH YOUR NAUSEA MEDICATION  *UNUSUAL SHORTNESS OF BREATH  *UNUSUAL BRUISING OR BLEEDING  TENDERNESS IN MOUTH AND THROAT WITH OR WITHOUT PRESENCE OF ULCERS  *URINARY PROBLEMS  *BOWEL PROBLEMS  UNUSUAL RASH Items with * indicate a potential emergency and should be followed up as soon as possible.  Feel free to call the clinic you have any questions or concerns. The clinic phone number is [phone].  Please show the [facility] at check to the Emergency Department and triage nurse.

## 2014-05-14 ENCOUNTER — Encounter: Payer: Self-pay | Admitting: Pulmonary Disease

## 2014-05-14 ENCOUNTER — Telehealth: Payer: Self-pay | Admitting: *Deleted

## 2014-05-14 ENCOUNTER — Ambulatory Visit (HOSPITAL_BASED_OUTPATIENT_CLINIC_OR_DEPARTMENT_OTHER): Payer: Medicare Other

## 2014-05-14 ENCOUNTER — Ambulatory Visit (INDEPENDENT_AMBULATORY_CARE_PROVIDER_SITE_OTHER): Payer: Medicare Other | Admitting: Pulmonary Disease

## 2014-05-14 VITALS — BP 133/78 | HR 78 | Temp 98.2°F | Ht 61.0 in | Wt 101.0 lb

## 2014-05-14 DIAGNOSIS — C7A8 Other malignant neuroendocrine tumors: Secondary | ICD-10-CM

## 2014-05-14 DIAGNOSIS — J449 Chronic obstructive pulmonary disease, unspecified: Secondary | ICD-10-CM | POA: Diagnosis not present

## 2014-05-14 DIAGNOSIS — C3490 Malignant neoplasm of unspecified part of unspecified bronchus or lung: Secondary | ICD-10-CM

## 2014-05-14 DIAGNOSIS — C3492 Malignant neoplasm of unspecified part of left bronchus or lung: Secondary | ICD-10-CM

## 2014-05-14 DIAGNOSIS — I10 Essential (primary) hypertension: Secondary | ICD-10-CM

## 2014-05-14 DIAGNOSIS — Z5189 Encounter for other specified aftercare: Secondary | ICD-10-CM

## 2014-05-14 MED ORDER — PEGFILGRASTIM INJECTION 6 MG/0.6ML ~~LOC~~
6.0000 mg | PREFILLED_SYRINGE | Freq: Once | SUBCUTANEOUS | Status: AC
  Administered 2014-05-14: 6 mg via SUBCUTANEOUS
  Filled 2014-05-14: qty 0.6

## 2014-05-14 NOTE — Progress Notes (Signed)
   Subjective:    Patient ID: Alexa Stephens, female    DOB: [birth year], 64 y.o.   MRN: [id number]  HPI  64 y.o. female smoker with severe COPD & Metastatic small cell Lung Cancer , on chronic O2 and steroids  Spirometry 11/01/11>>FEV1 0.48 (21%), FEV1% 37 (difficult pt effort). A1AT 06/21/12 >> 140, PI-MS   Recent dx of extensive stage small cell lung cancer  She presented with bilateral pulmonary nodules in addition to mediastinal lymphadenopathy and metastatic liver lesions with extensive weight loss.  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   She was seen in the [facility] clinic Provided with a wheelchair and cane order to help her as she is weak and difficult to walk long distances due to weakness and dyspnea. Very unstable with gait.  On BREO .   MRI of the brain showed multiple small metastatic brain lesions She is due for cycle 2 of carboplatin and etoposide , Rt to start after chemo She was diagnosed with shingles last Wednesday and started on a high dose course of valacyclovir.   Accompanied by son & sister  Past Medical History  Diagnosis Date  . Anxiety disorder   . Asthma   . COPD (chronic obstructive pulmonary disease)     Not on home O2 yet but is prescribed.  . Headache(784.0)   . Hyperlipemia   . Hypertension   . Pneumonia Sept 2013  . Sepsis Sept 2013  . Cancer     lung cancer      Review of Systems neg for any significant sore throat, dysphagia, itching, sneezing, nasal congestion or excess/ purulent secretions, fever, chills, sweats, unintended wt loss, pleuritic or exertional cp, hempoptysis, orthopnea pnd or change in chronic leg swelling. Also denies presyncope, palpitations, heartburn, abdominal pain, nausea, vomiting, diarrhea or change in bowel or urinary habits, dysuria,hematuria, rash, arthralgias, visual complaints, headache, numbness weakness or ataxia.     Objective:   Physical Exam  Gen. Pleasant,  poorly-nourished, in no distress, normal affect, in whelchair ENT - no lesions, no post nasal drip Neck: No JVD, no thyromegaly, no carotid bruits Lungs: no use of accessory muscles, no dullness to percussion, clear without rales or rhonchi  Cardiovascular: Rhythm regular, heart sounds  normal, no murmurs or gallops, no peripheral edema Abdomen: soft and non-tender, no hepatosplenomegaly, BS normal. Musculoskeletal: No deformities, no cyanosis or clubbing Neuro:  alert, non focal       Assessment & Plan:

## 2014-05-14 NOTE — Patient Instructions (Signed)
Pegfilgrastim injection What is this medicine? PEGFILGRASTIM (peg fil GRA stim) is a long-acting granulocyte colony-stimulating factor that stimulates the growth of neutrophils, a type of white blood cell important in the body's fight against infection. It is used to reduce the incidence of fever and infection in patients with certain types of cancer who are receiving chemotherapy that affects the bone marrow. This medicine may be used for other purposes; ask your health care provider or pharmacist if you have questions. COMMON BRAND NAME(S): Neulasta What should I tell my health care provider before I take this medicine? They need to know if you have any of these conditions: -latex allergy -ongoing radiation therapy -sickle cell disease -skin reactions to acrylic adhesives (On-Body Injector only) -an unusual or allergic reaction to pegfilgrastim, filgrastim, other medicines, foods, dyes, or preservatives -pregnant or trying to get pregnant -breast-feeding How should I use this medicine? This medicine is for injection under the skin. If you get this medicine at home, you will be taught how to prepare and give the pre-filled syringe or how to use the On-body Injector. Refer to the patient Instructions for Use for detailed instructions. Use exactly as directed. Take your medicine at regular intervals. Do not take your medicine more often than directed. It is important that you put your used needles and syringes in a special sharps container. Do not put them in a trash can. If you do not have a sharps container, call your pharmacist or healthcare provider to get one. Talk to your pediatrician regarding the use of this medicine in children. Special care may be needed. Overdosage: If you think you have taken too much of this medicine contact a poison control center or emergency room at once. NOTE: This medicine is only for you. Do not share this medicine with others. What if I miss a dose? It is  important not to miss your dose. Call your doctor or health care professional if you miss your dose. If you miss a dose due to an On-body Injector failure or leakage, a new dose should be administered as soon as possible using a single prefilled syringe for manual use. What may interact with this medicine? Interactions have not been studied. Give your health care provider a list of all the medicines, herbs, non-prescription drugs, or dietary supplements you use. Also tell them if you smoke, drink alcohol, or use illegal drugs. Some items may interact with your medicine. This list may not describe all possible interactions. Give your health care provider a list of all the medicines, herbs, non-prescription drugs, or dietary supplements you use. Also tell them if you smoke, drink alcohol, or use illegal drugs. Some items may interact with your medicine. What should I watch for while using this medicine? You may need blood work done while you are taking this medicine. If you are going to need a MRI, CT scan, or other procedure, tell your doctor that you are using this medicine (On-Body Injector only). What side effects may I notice from receiving this medicine? Side effects that you should report to your doctor or health care professional as soon as possible: -allergic reactions like skin rash, itching or hives, swelling of the face, lips, or tongue -dizziness -fever -pain, redness, or irritation at site where injected -pinpoint red spots on the skin -shortness of breath or breathing problems -stomach or side pain, or pain at the shoulder -swelling -tiredness -trouble passing urine Side effects that usually do not require medical attention (report to your doctor   or health care professional if they continue or are bothersome): -bone pain -muscle pain This list may not describe all possible side effects. Call your doctor for medical advice about side effects. You may report side effects to FDA at  1-800-FDA-1088. Where should I keep my medicine? Keep out of the reach of children. Store pre-filled syringes in a refrigerator between 2 and 8 degrees C (36 and 46 degrees F). Do not freeze. Keep in carton to protect from light. Throw away this medicine if it is left out of the refrigerator for more than 48 hours. Throw away any unused medicine after the expiration date. NOTE: This sheet is a summary. It may not cover all possible information. If you have questions about this medicine, talk to your doctor, pharmacist, or health care provider.  2015, [employer]vier/[employer] Standard. (2013-05-15 16:14:05)  

## 2014-05-14 NOTE — Telephone Encounter (Signed)
Alexa Stephens called on behalf of his mom reporting running late after appointment with [facility].  On the way and will be here at 5:00 or later.  This nurse notified injection nurse and infusion room.

## 2014-05-14 NOTE — Patient Instructions (Signed)
STOP breo OK to take duonebs upto 4 times /day  If Bp stays good, can stop lisinopril You are doing remarkably well with chemotherapy

## 2014-05-15 ENCOUNTER — Telehealth: Payer: Self-pay | Admitting: *Deleted

## 2014-05-15 ENCOUNTER — Other Ambulatory Visit: Payer: Self-pay | Admitting: Medical Oncology

## 2014-05-15 DIAGNOSIS — C3490 Malignant neoplasm of unspecified part of unspecified bronchus or lung: Secondary | ICD-10-CM

## 2014-05-15 MED ORDER — HYDROCODONE-HOMATROPINE 5-1.5 MG/5ML PO SYRP: 5.0000 mL | ORAL_SOLUTION | Freq: Four times a day (QID) | ORAL | Status: DC | PRN

## 2014-05-15 NOTE — Telephone Encounter (Signed)
Pt notified rx is ready for pick up . Locked in in jection room.

## 2014-05-15 NOTE — Telephone Encounter (Signed)
Patient's sister called to request a refill for Alexa Stephens's cough medication--hydrocodone-homatrpine 5/1.5 mg/1ml.  Please call her son at [phone] when it is ready to be picked up.

## 2014-05-18 ENCOUNTER — Other Ambulatory Visit (HOSPITAL_BASED_OUTPATIENT_CLINIC_OR_DEPARTMENT_OTHER): Payer: Medicare Other

## 2014-05-18 DIAGNOSIS — C7B8 Other secondary neuroendocrine tumors: Secondary | ICD-10-CM

## 2014-05-18 DIAGNOSIS — C3492 Malignant neoplasm of unspecified part of left bronchus or lung: Secondary | ICD-10-CM

## 2014-05-18 DIAGNOSIS — C7A8 Other malignant neuroendocrine tumors: Secondary | ICD-10-CM

## 2014-05-18 LAB — COMPREHENSIVE METABOLIC PANEL (CC13)
ALT: 37 U/L (ref 0–55)
ANION GAP: 9 meq/L (ref 3–11)
AST: 14 U/L (ref 5–34)
Albumin: 3.4 g/dL — ABNORMAL LOW (ref 3.5–5.0)
Alkaline Phosphatase: 216 U/L — ABNORMAL HIGH (ref 40–150)
BUN: 22.4 mg/dL (ref 7.0–26.0)
CALCIUM: 9 mg/dL (ref 8.4–10.4)
CHLORIDE: 94 meq/L — AB (ref 98–109)
CO2: 33 meq/L — AB (ref 22–29)
Creatinine: 0.5 mg/dL — ABNORMAL LOW (ref 0.6–1.1)
EGFR: >90 mL/min/{1.73_m2} (ref >90)
Glucose: 158 mg/dl — ABNORMAL HIGH (ref 70–140)
Potassium: 4.8 mEq/L (ref 3.5–5.1)
Sodium: 136 mEq/L (ref 136–145)
Total Bilirubin: 0.4 mg/dL (ref 0.20–1.20)
Total Protein: 6.1 g/dL — ABNORMAL LOW (ref 6.4–8.3)

## 2014-05-18 LAB — CBC WITH DIFFERENTIAL/PLATELET
BASO%: 0.1 % (ref 0.0–2.0)
Basophils Absolute: 0 10*3/uL (ref 0.0–0.1)
EOS ABS: 0 10*3/uL (ref 0.0–0.5)
EOS%: 0 % (ref 0.0–7.0)
HEMATOCRIT: 33.1 % — AB (ref 34.8–46.6)
HGB: 10.3 g/dL — ABNORMAL LOW (ref 11.6–15.9)
LYMPH#: 0.8 10*3/uL — AB (ref 0.9–3.3)
LYMPH%: 2.2 % — ABNORMAL LOW (ref 14.0–49.7)
MCH: 30 pg (ref 25.1–34.0)
MCHC: 31.2 g/dL — ABNORMAL LOW (ref 31.5–36.0)
MCV: 96.2 fL (ref 79.5–101.0)
MONO#: 0.7 10*3/uL (ref 0.1–0.9)
MONO%: 1.9 % (ref 0.0–14.0)
NEUT#: 34.6 10*3/uL — ABNORMAL HIGH (ref 1.5–6.5)
NEUT%: 95.8 % — ABNORMAL HIGH (ref 38.4–76.8)
Platelets: 413 10*3/uL — ABNORMAL HIGH (ref 145–400)
RBC: 3.44 10*6/uL — AB (ref 3.70–5.45)
RDW: 15.9 % — AB (ref 11.2–14.5)
WBC: 36.1 10*3/uL — AB (ref 3.9–10.3)

## 2014-05-18 NOTE — Assessment & Plan Note (Signed)
STOP breo OK to take duonebs upto 4 times /day

## 2014-05-18 NOTE — Assessment & Plan Note (Signed)
She is toelrating chemotherapy Discussed QOL issues & prognosis in detail with her & son

## 2014-05-18 NOTE — Assessment & Plan Note (Signed)
If Bp stays good, can stop lisinopril

## 2014-05-25 ENCOUNTER — Other Ambulatory Visit (HOSPITAL_BASED_OUTPATIENT_CLINIC_OR_DEPARTMENT_OTHER): Payer: Medicare Other

## 2014-05-25 DIAGNOSIS — C7B8 Other secondary neuroendocrine tumors: Secondary | ICD-10-CM

## 2014-05-25 DIAGNOSIS — C7A8 Other malignant neuroendocrine tumors: Secondary | ICD-10-CM

## 2014-05-25 DIAGNOSIS — C3492 Malignant neoplasm of unspecified part of left bronchus or lung: Secondary | ICD-10-CM

## 2014-05-25 LAB — CBC WITH DIFFERENTIAL/PLATELET
BASO%: 0.3 % (ref 0.0–2.0)
Basophils Absolute: 0.1 10*3/uL (ref 0.0–0.1)
EOS%: 0 % (ref 0.0–7.0)
Eosinophils Absolute: 0 10*3/uL (ref 0.0–0.5)
HCT: 38.6 % (ref 34.8–46.6)
HGB: 12.4 g/dL (ref 11.6–15.9)
LYMPH%: 7 % — ABNORMAL LOW (ref 14.0–49.7)
MCH: 31.6 pg (ref 25.1–34.0)
MCHC: 32.1 g/dL (ref 31.5–36.0)
MCV: 98.5 fL (ref 79.5–101.0)
MONO#: 1.1 10*3/uL — ABNORMAL HIGH (ref 0.1–0.9)
MONO%: 3.3 % (ref 0.0–14.0)
NEUT#: 29.2 10*3/uL — ABNORMAL HIGH (ref 1.5–6.5)
NEUT%: 89.4 % — ABNORMAL HIGH (ref 38.4–76.8)
Platelets: 155 10*3/uL (ref 145–400)
RBC: 3.92 10*6/uL (ref 3.70–5.45)
RDW: 18.4 % — AB (ref 11.2–14.5)
WBC: 32.7 10*3/uL — AB (ref 3.9–10.3)
lymph#: 2.3 10*3/uL (ref 0.9–3.3)

## 2014-05-25 LAB — COMPREHENSIVE METABOLIC PANEL (CC13)
ALBUMIN: 3.5 g/dL (ref 3.5–5.0)
ALK PHOS: 273 U/L — AB (ref 40–150)
ALT: 62 U/L — ABNORMAL HIGH (ref 0–55)
AST: 21 U/L (ref 5–34)
Anion Gap: 10 mEq/L (ref 3–11)
BUN: 31.3 mg/dL — AB (ref 7.0–26.0)
CO2: 34 mEq/L — ABNORMAL HIGH (ref 22–29)
Calcium: 9 mg/dL (ref 8.4–10.4)
Chloride: 91 mEq/L — ABNORMAL LOW (ref 98–109)
Creatinine: 0.6 mg/dL (ref 0.6–1.1)
GLUCOSE: 218 mg/dL — AB (ref 70–140)
Potassium: 4.8 mEq/L (ref 3.5–5.1)
Sodium: 135 mEq/L — ABNORMAL LOW (ref 136–145)
Total Protein: 6.7 g/dL (ref 6.4–8.3)

## 2014-05-26 ENCOUNTER — Ambulatory Visit: Payer: Medicare Other | Admitting: Family Medicine

## 2014-06-01 ENCOUNTER — Telehealth: Payer: Self-pay | Admitting: Internal Medicine

## 2014-06-01 ENCOUNTER — Ambulatory Visit (HOSPITAL_BASED_OUTPATIENT_CLINIC_OR_DEPARTMENT_OTHER): Payer: Medicare Other | Admitting: Internal Medicine

## 2014-06-01 ENCOUNTER — Other Ambulatory Visit (HOSPITAL_BASED_OUTPATIENT_CLINIC_OR_DEPARTMENT_OTHER): Payer: Medicare Other

## 2014-06-01 ENCOUNTER — Other Ambulatory Visit: Payer: Medicare Other

## 2014-06-01 ENCOUNTER — Ambulatory Visit: Payer: Medicare Other | Admitting: Internal Medicine

## 2014-06-01 ENCOUNTER — Encounter: Payer: Self-pay | Admitting: *Deleted

## 2014-06-01 ENCOUNTER — Ambulatory Visit: Payer: Medicare Other

## 2014-06-01 ENCOUNTER — Encounter: Payer: Self-pay | Admitting: Internal Medicine

## 2014-06-01 VITALS — BP 127/70 | HR 100 | Temp 98.3°F | Resp 22 | Ht 61.0 in | Wt 103.5 lb

## 2014-06-01 DIAGNOSIS — C7A8 Other malignant neuroendocrine tumors: Secondary | ICD-10-CM

## 2014-06-01 DIAGNOSIS — J449 Chronic obstructive pulmonary disease, unspecified: Secondary | ICD-10-CM | POA: Diagnosis not present

## 2014-06-01 DIAGNOSIS — B029 Zoster without complications: Secondary | ICD-10-CM | POA: Diagnosis not present

## 2014-06-01 DIAGNOSIS — C7B8 Other secondary neuroendocrine tumors: Secondary | ICD-10-CM | POA: Diagnosis not present

## 2014-06-01 DIAGNOSIS — C3492 Malignant neoplasm of unspecified part of left bronchus or lung: Secondary | ICD-10-CM

## 2014-06-01 DIAGNOSIS — C3490 Malignant neoplasm of unspecified part of unspecified bronchus or lung: Secondary | ICD-10-CM

## 2014-06-01 LAB — CBC WITH DIFFERENTIAL/PLATELET
BASO%: 0.2 % (ref 0.0–2.0)
Basophils Absolute: 0.1 10*3/uL (ref 0.0–0.1)
EOS%: 0 % (ref 0.0–7.0)
Eosinophils Absolute: 0 10*3/uL (ref 0.0–0.5)
HEMATOCRIT: 38.4 % (ref 34.8–46.6)
HGB: 12.2 g/dL (ref 11.6–15.9)
LYMPH%: 5 % — ABNORMAL LOW (ref 14.0–49.7)
MCH: 31.7 pg (ref 25.1–34.0)
MCHC: 31.8 g/dL (ref 31.5–36.0)
MCV: 99.7 fL (ref 79.5–101.0)
MONO#: 0.5 10*3/uL (ref 0.1–0.9)
MONO%: 1.9 % (ref 0.0–14.0)
NEUT#: 26.8 10*3/uL — ABNORMAL HIGH (ref 1.5–6.5)
NEUT%: 92.9 % — ABNORMAL HIGH (ref 38.4–76.8)
Platelets: 255 10*3/uL (ref 145–400)
RBC: 3.85 10*6/uL (ref 3.70–5.45)
RDW: 19 % — ABNORMAL HIGH (ref 11.2–14.5)
WBC: 28.9 10*3/uL — AB (ref 3.9–10.3)
lymph#: 1.5 10*3/uL (ref 0.9–3.3)

## 2014-06-01 LAB — COMPREHENSIVE METABOLIC PANEL (CC13)
ALBUMIN: 3.3 g/dL — AB (ref 3.5–5.0)
ALT: 63 U/L — ABNORMAL HIGH (ref 0–55)
AST: 23 U/L (ref 5–34)
Alkaline Phosphatase: 209 U/L — ABNORMAL HIGH (ref 40–150)
Anion Gap: 9 mEq/L (ref 3–11)
BILIRUBIN TOTAL: 0.21 mg/dL (ref 0.20–1.20)
BUN: 16.9 mg/dL (ref 7.0–26.0)
CALCIUM: 8.7 mg/dL (ref 8.4–10.4)
CO2: 36 mEq/L — ABNORMAL HIGH (ref 22–29)
CREATININE: 0.5 mg/dL — AB (ref 0.6–1.1)
Chloride: 95 mEq/L — ABNORMAL LOW (ref 98–109)
Glucose: 122 mg/dl (ref 70–140)
Potassium: 5.1 mEq/L (ref 3.5–5.1)
SODIUM: 141 meq/L (ref 136–145)
TOTAL PROTEIN: 6.4 g/dL (ref 6.4–8.3)

## 2014-06-01 MED ORDER — VALACYCLOVIR HCL 1 G PO TABS: 1000.0000 mg | ORAL_TABLET | Freq: Three times a day (TID) | ORAL | Status: DC

## 2014-06-01 NOTE — Telephone Encounter (Signed)
Confirm appointment for April 11-14

## 2014-06-01 NOTE — [facility] Oncology Navigator Note (Unsigned)
Spoke to patient today.  She has active shingles and is unable to get treatment today.  Dr. [name] has prescribed medication for that.  No needed identified at this time.

## 2014-06-01 NOTE — Telephone Encounter (Signed)
Gave avs & calendar for April/May. Sent message to schedule treatment. Sent message to Education officer, museum for patient.

## 2014-06-01 NOTE — Progress Notes (Signed)
[facility] [phone]   [phone]  OFFICE PROGRESS NOTE  [name], DO [place] [address]  DIAGNOSIS: Extensive stage (T1a, N2, M1b) small cell lung cancer diagnosed in February 2016.  PRIOR THERAPY: None  CURRENT THERAPY: Systemic chemotherapy with carboplatin for AUC of 5 on day 1 and etoposide 100 MG/M2 on days 1, 2 and 3 with Neulasta support on day 4. Status post 2 cycles.  INTERVAL HISTORY: Alexa Stephens 64 y.o. female returns to the clinic today for follow-up visit accompanied by her son. The patient continues to have increasing fatigue and weakness especially in the lower extremities. She is tolerating her treatment fairly well except for the fatigue secondary to chemotherapy-induced anemia. She had persistent shortness of breath secondary to COPD. The patient denied having any significant fever or chills, no nausea or vomiting. Her son noticed some memory issues. She was treated recently for herpes zoster in the right buttock area. It has initial improvement with Valtrex but there is evidence for recurrence in the same area. She was supposed to start cycle #3 of her chemotherapy today.  MEDICAL HISTORY: Past Medical History  Diagnosis Date  . Anxiety disorder   . Asthma   . COPD (chronic obstructive pulmonary disease)     Not on home O2 yet but is prescribed.  . Headache(784.0)   . Hyperlipemia   . Hypertension   . Pneumonia Sept 2013  . Sepsis Sept 2013  . Cancer     lung cancer     ALLERGIES:  is allergic to benadryl; compazine; excedrin extra strength; flexeril; lexapro; naproxen; skelaxin; and tylenol.  MEDICATIONS:  Current Outpatient Prescriptions  Medication Sig Dispense Refill  . albuterol (PROAIR HFA) 108 (90 BASE) MCG/ACT inhaler Inhale 2 puffs into the lungs every 6 (six) hours as needed for wheezing or shortness of breath. 3 Inhaler 1  . albuterol (PROVENTIL) (2.5 MG/3ML) 0.083% nebulizer  solution Inhale 3 mLs into the lungs every 6 (six) hours as needed for wheezing or shortness of breath.   12  . Calcium Carbonate-Vitamin D (CALCIUM-VITAMIN D) 500-200 MG-UNIT per tablet Take 1 tablet by mouth daily. When remembers    . clonazePAM (KLONOPIN) 1 MG tablet TAKE 1 TABLET BY MOUTH TWICE DAILY AS NEEDED FOR ANXIETY 60 tablet 0  . dexamethasone (DECADRON) 4 MG tablet Take 1 tablet (4 mg total) by mouth 2 (two) times daily. 60 tablet 0  . fluconazole (DIFLUCAN) 100 MG tablet Take 1 tablet (100 mg total) by mouth daily. 7 tablet 0  . Fluticasone Furoate-Vilanterol (BREO ELLIPTA) 100-25 MCG/INH AEPB Inhale 1 puff into the lungs daily. 28 each 5  . HYDROcodone-homatropine (HYCODAN) 5-1.5 MG/5ML syrup Take 5 mLs by mouth every 6 (six) hours as needed for cough. 120 mL 0  . ipratropium (ATROVENT) 0.02 % nebulizer solution Take 2.5 mLs (500 mcg total) by nebulization every 6 (six) hours as needed (ICD10: J44.9). 75 mL 5  . Ipratropium-Albuterol (COMBIVENT RESPIMAT) 20-100 MCG/ACT AERS respimat INHALE 1 PUFF BY MOUTH EVERY 6 HOURS. 4 g 0  . lidocaine-prilocaine (EMLA) cream Apply 1 application topically as needed. 30 g 2  . lisinopril (PRINIVIL,ZESTRIL) 20 MG tablet Take 1 tablet (20 mg total) by mouth daily. 90 tablet 3  . Multiple Vitamin (MULTIVITAMIN WITH MINERALS) TABS tablet Take 1 tablet by mouth daily.    . ondansetron (ZOFRAN) 8 MG tablet Take 1 tablet (8 mg total) by mouth every 8 (eight)  hours as needed for nausea or vomiting. 20 tablet 0  . oxyCODONE (OXY IR/ROXICODONE) 5 MG immediate release tablet Take 1 tablet (5 mg total) by mouth every 4 (four) hours as needed for severe pain. 30 tablet 0  . pantoprazole (PROTONIX) 40 MG tablet Take 1 tablet (40 mg total) by mouth daily. 30 tablet 1  . PRESCRIPTION MEDICATION She receives chemo treatments at the [facility] with Dr. [name]. She received Carboplatin 310mg  and Etoposide 130mg  on 04/20/14 and Etoposide 130mg  on 04/21/14.     . rizatriptan (MAXALT-MLT) 5 MG disintegrating tablet Take 5 mg by mouth as needed for migraine. May repeat in 2 hours if needed    . simvastatin (ZOCOR) 20 MG tablet Take 20 mg by mouth daily.    . valACYclovir (VALTREX) 1000 MG tablet Take 1 tablet (1,000 mg total) by mouth 3 (three) times daily. 30 tablet 0   No current facility-administered medications for this visit.    SURGICAL HISTORY:  Past Surgical History  Procedure Laterality Date  . Cosmetic surgery    . Cystectomy    . Hand surgery      REVIEW OF SYSTEMS:  Constitutional: positive for anorexia and fatigue Eyes: negative Ears, nose, mouth, throat, and face: negative Respiratory: negative Cardiovascular: negative Gastrointestinal: negative Genitourinary:negative Integument/breast: negative Hematologic/lymphatic: negative Musculoskeletal:positive for muscle weakness Neurological: positive for memory problems Behavioral/Psych: negative Endocrine: negative Allergic/Immunologic: negative   PHYSICAL EXAMINATION: General appearance: alert, cooperative, fatigued and no distress Head: Normocephalic, without obvious abnormality, atraumatic Neck: no adenopathy, no JVD, supple, symmetrical, trachea midline and thyroid not enlarged, symmetric, no tenderness/mass/nodules Lymph nodes: Cervical, supraclavicular, and axillary nodes normal. Resp: clear to auscultation bilaterally Back: symmetric, no curvature. ROM normal. No CVA tenderness. Cardio: regular rate and rhythm, S1, S2 normal, no murmur, click, rub or gallop GI: soft, non-tender; bowel sounds normal; no masses,  no organomegaly Extremities: extremities normal, atraumatic, no cyanosis or edema Neurologic: Alert and oriented X 3, normal strength and tone. Normal symmetric reflexes. Normal coordination and gait  ECOG PERFORMANCE STATUS: 2 - Symptomatic, <50% confined to bed  Blood pressure 127/70, pulse 100, temperature 98.3 F (36.8 C), temperature source Oral, resp.  rate 22, height 5\' 1"  (1.549 m), weight 103 lb 8 oz (46.947 kg), SpO2 100 %.  LABORATORY DATA: Lab Results  Component Value Date   WBC 28.9* 06/01/2014   HGB 12.2 06/01/2014   HCT 38.4 06/01/2014   MCV 99.7 06/01/2014   PLT 255 06/01/2014      Chemistry      Component Value Date/Time   NA 135* 05/25/2014 1415   NA 141 04/09/2014 1120   K 4.8 05/25/2014 1415   K 3.5 04/09/2014 1120   CL 97 04/09/2014 1120   CO2 34* 05/25/2014 1415   CO2 38* 04/09/2014 1120   BUN 31.3* 05/25/2014 1415   BUN 12 04/09/2014 1120   CREATININE 0.6 05/25/2014 1415   CREATININE 0.38* 04/09/2014 1120   CREATININE 0.45* 04/02/2014 1642      Component Value Date/Time   CALCIUM 9.0 05/25/2014 1415   CALCIUM 9.3 04/09/2014 1120   ALKPHOS 273* 05/25/2014 1415   ALKPHOS 103 04/09/2014 1120   AST 21 05/25/2014 1415   AST 39* 04/09/2014 1120   ALT 62* 05/25/2014 1415   ALT 24 04/09/2014 1120   BILITOT <0.20 05/25/2014 1415   BILITOT 0.6 04/09/2014 1120       RADIOGRAPHIC STUDIES: No results found.  ASSESSMENT AND PLAN: This is a very  pleasant 64 years old white female with extensive stage small cell lung cancer currently undergoing systemic chemotherapy with carboplatin and etoposide status post 2 cycles. She tolerated the first 2 cycles fairly well except for the fatigue secondary to chemotherapy-induced anemia. She also has persistent shortness of breath secondary to COPD in addition to her disease. I recommended for the patient to continue her systemic chemotherapy with carboplatin and etoposide that we will delay the start of cycle #3 by 1 week because of the active herpes zoster. For the herpes zoster of the right buttock, I will start the patient again on Valtrex 1000 mg by mouth 3 times a day for 1 week. She will come back for follow-up visit in 4 weeks for reevaluation after repeating CT scan of the chest, abdomen and pelvis as well as MRI of the brain. She was advised to call immediately if  she has any concerning symptoms in the interval. The patient voices understanding of current disease status and treatment options and is in agreement with the current care plan.  All questions were answered. The patient knows to call the clinic with any problems, questions or concerns. We can certainly see the patient much sooner if necessary.  Disclaimer: This note was dictated with voice recognition software. Similar sounding words can inadvertently be transcribed and may not be corrected upon review.

## 2014-06-02 ENCOUNTER — Ambulatory Visit: Payer: Medicare Other

## 2014-06-03 ENCOUNTER — Ambulatory Visit: Payer: Medicare Other

## 2014-06-04 ENCOUNTER — Ambulatory Visit: Payer: Medicare Other

## 2014-06-08 ENCOUNTER — Other Ambulatory Visit (HOSPITAL_BASED_OUTPATIENT_CLINIC_OR_DEPARTMENT_OTHER): Payer: Medicare Other

## 2014-06-08 ENCOUNTER — Ambulatory Visit: Payer: Medicare Other

## 2014-06-08 DIAGNOSIS — C3490 Malignant neoplasm of unspecified part of unspecified bronchus or lung: Secondary | ICD-10-CM

## 2014-06-08 DIAGNOSIS — C7A8 Other malignant neuroendocrine tumors: Secondary | ICD-10-CM

## 2014-06-08 DIAGNOSIS — C7B8 Other secondary neuroendocrine tumors: Secondary | ICD-10-CM | POA: Diagnosis not present

## 2014-06-08 DIAGNOSIS — C3492 Malignant neoplasm of unspecified part of left bronchus or lung: Secondary | ICD-10-CM

## 2014-06-08 LAB — COMPREHENSIVE METABOLIC PANEL (CC13)
ALT: 79 U/L — ABNORMAL HIGH (ref 0–55)
ANION GAP: 8 meq/L (ref 3–11)
AST: 23 U/L (ref 5–34)
Albumin: 3.2 g/dL — ABNORMAL LOW (ref 3.5–5.0)
Alkaline Phosphatase: 140 U/L (ref 40–150)
BUN: 21.2 mg/dL (ref 7.0–26.0)
CALCIUM: 9.1 mg/dL (ref 8.4–10.4)
CHLORIDE: 94 meq/L — AB (ref 98–109)
CO2: 38 meq/L — AB (ref 22–29)
Creatinine: 0.5 mg/dL — ABNORMAL LOW (ref 0.6–1.1)
EGFR: >90 mL/min/{1.73_m2} (ref >90)
GLUCOSE: 83 mg/dL (ref 70–140)
Potassium: 4.6 mEq/L (ref 3.5–5.1)
Sodium: 141 mEq/L (ref 136–145)
Total Bilirubin: 0.21 mg/dL (ref 0.20–1.20)
Total Protein: 6.3 g/dL — ABNORMAL LOW (ref 6.4–8.3)

## 2014-06-08 LAB — CBC WITH DIFFERENTIAL/PLATELET
BASO%: 0.3 % (ref 0.0–2.0)
Basophils Absolute: 0.1 10*3/uL (ref 0.0–0.1)
EOS%: 0 % (ref 0.0–7.0)
Eosinophils Absolute: 0 10*3/uL (ref 0.0–0.5)
HEMATOCRIT: 36.2 % (ref 34.8–46.6)
HGB: 11.4 g/dL — ABNORMAL LOW (ref 11.6–15.9)
LYMPH%: 8.3 % — AB (ref 14.0–49.7)
MCH: 30.8 pg (ref 25.1–34.0)
MCHC: 31.5 g/dL (ref 31.5–36.0)
MCV: 97.8 fL (ref 79.5–101.0)
MONO#: 1.5 10*3/uL — ABNORMAL HIGH (ref 0.1–0.9)
MONO%: 6.7 % (ref 0.0–14.0)
NEUT#: 18.6 10*3/uL — ABNORMAL HIGH (ref 1.5–6.5)
NEUT%: 84.7 % — AB (ref 38.4–76.8)
PLATELETS: 374 10*3/uL (ref 145–400)
RBC: 3.7 10*6/uL (ref 3.70–5.45)
RDW: 20.2 % — ABNORMAL HIGH (ref 11.2–14.5)
WBC: 21.9 10*3/uL — AB (ref 3.9–10.3)
lymph#: 1.8 10*3/uL (ref 0.9–3.3)

## 2014-06-08 MED ORDER — VALACYCLOVIR HCL 1 G PO TABS: 1000.0000 mg | ORAL_TABLET | Freq: Three times a day (TID) | ORAL | Status: DC

## 2014-06-08 MED ORDER — HYDROCODONE-HOMATROPINE 5-1.5 MG/5ML PO SYRP: 5.0000 mL | ORAL_SOLUTION | Freq: Four times a day (QID) | ORAL | Status: DC | PRN

## 2014-06-08 NOTE — Progress Notes (Signed)
Pt and son verbalized wanting to push today treatment out 1 additional week, pt "shingles are getting worse" per son. RN looked at pt right flank and there is an area approximately2-3in of open sores, no weeping at this time. Left flank has red area where possible spread. Pt son requested refill on Hycodan syrup and Valtrex, push chemo out 1 week. Pallative consult per Social worker has been scheduled for next Monday. Reviewed  With MD, ok to refill hycodan and valtrex, r/s pt chemo to next week.. valtrex reordered per sons request- [facility] is [facility].

## 2014-06-09 ENCOUNTER — Ambulatory Visit: Payer: Medicare Other

## 2014-06-10 ENCOUNTER — Encounter: Payer: Self-pay | Admitting: *Deleted

## 2014-06-10 ENCOUNTER — Ambulatory Visit: Payer: Medicare Other

## 2014-06-10 NOTE — Progress Notes (Signed)
[place] Work  Clinical Social Work was referred by patient's son for assessment of psychosocial needs.  Clinical Social Worker met with patient and patient's son [name] at length in infusion room to offer support and assess for needs.  Patient's son shared that his mother has become more confused, is having continence issues, reports pain in her stomach, has shingles on her back, and is only able to ambulate minimally at home.  Patient's son is concerned patient currently does not have a good quality of life.  CSW explored concept of quality of life with patient/son.  Ms. Gudino was unable to share what she enjoyed doing prior to cancer and cannot think of anything she enjoys at this time.  They decided not to complete chemotherapy treatment this week due to current side effects.  CSW recommended patient/family meet with palliative care nurse practitioner to discuss patient's current medical situation, address current symptoms, and explore patient/family goals at this time.  Patient's medical oncologist was supportive of palliative care referral and is supportive of chemotherapy treatment or Hospice care depending on patient/family wishes. CSW will contact palliative care NP and scheduled appointment for 06/15/14 at 11:00 in radiation oncology.  Ms. Zaugg completed her healthcare advance directives at this time. The patient designated son Rhianon Zabawa as their primary healthcare agent and sister Kathrene Alu as their secondary agent.  Patient also completed healthcare living will- she designated she does not want life prolonging measures in the scenarios indicated in the living will.  CSW and patient/family spent time discussing how this would apply to her current situation.  Clinical Social Worker notarized documents and made copies for patient/family. Clinical Social Worker will send documents to medical records to be scanned into patient's chart. Clinical Social Worker encouraged patient/family  to contact with any additional questions or concerns.  Polo Riley, MSW, [place] Worker [facility] [phone]

## 2014-06-11 ENCOUNTER — Ambulatory Visit: Payer: Medicare Other

## 2014-06-15 ENCOUNTER — Encounter: Payer: Self-pay | Admitting: *Deleted

## 2014-06-15 ENCOUNTER — Telehealth: Payer: Self-pay | Admitting: Internal Medicine

## 2014-06-15 ENCOUNTER — Ambulatory Visit (HOSPITAL_BASED_OUTPATIENT_CLINIC_OR_DEPARTMENT_OTHER): Payer: Medicare Other

## 2014-06-15 ENCOUNTER — Ambulatory Visit
Admission: RE | Admit: 2014-06-15 | Discharge: 2014-06-15 | Disposition: A | Discharge status: Home / Self Care | Payer: Medicare Other | Source: Ambulatory Visit | Attending: Internal Medicine | Admitting: Internal Medicine

## 2014-06-15 ENCOUNTER — Other Ambulatory Visit (HOSPITAL_BASED_OUTPATIENT_CLINIC_OR_DEPARTMENT_OTHER): Payer: Medicare Other

## 2014-06-15 VITALS — BP 122/55 | HR 111 | Temp 98.7°F | Resp 20

## 2014-06-15 DIAGNOSIS — Z7189 Other specified counseling: Secondary | ICD-10-CM | POA: Diagnosis not present

## 2014-06-15 DIAGNOSIS — Z5111 Encounter for antineoplastic chemotherapy: Secondary | ICD-10-CM

## 2014-06-15 DIAGNOSIS — C7A8 Other malignant neuroendocrine tumors: Secondary | ICD-10-CM

## 2014-06-15 DIAGNOSIS — C3492 Malignant neoplasm of unspecified part of left bronchus or lung: Secondary | ICD-10-CM

## 2014-06-15 DIAGNOSIS — R531 Weakness: Secondary | ICD-10-CM | POA: Diagnosis not present

## 2014-06-15 DIAGNOSIS — C3400 Malignant neoplasm of unspecified main bronchus: Secondary | ICD-10-CM

## 2014-06-15 DIAGNOSIS — C7B8 Other secondary neuroendocrine tumors: Secondary | ICD-10-CM | POA: Diagnosis not present

## 2014-06-15 LAB — COMPREHENSIVE METABOLIC PANEL (CC13)
ALK PHOS: 124 U/L (ref 40–150)
ALT: 48 U/L (ref 0–55)
ANION GAP: 9 meq/L (ref 3–11)
AST: 18 U/L (ref 5–34)
Albumin: 3.2 g/dL — ABNORMAL LOW (ref 3.5–5.0)
BUN: 21.1 mg/dL (ref 7.0–26.0)
CO2: 35 meq/L — AB (ref 22–29)
Calcium: 9.1 mg/dL (ref 8.4–10.4)
Chloride: 96 mEq/L — ABNORMAL LOW (ref 98–109)
Creatinine: 0.5 mg/dL — ABNORMAL LOW (ref 0.6–1.1)
GLUCOSE: 156 mg/dL — AB (ref 70–140)
POTASSIUM: 4.3 meq/L (ref 3.5–5.1)
Sodium: 140 mEq/L (ref 136–145)
Total Bilirubin: <0.2 mg/dL (ref 0.20–1.20)
Total Protein: 6.2 g/dL — ABNORMAL LOW (ref 6.4–8.3)

## 2014-06-15 LAB — CBC WITH DIFFERENTIAL/PLATELET
BASO%: 0.3 % (ref 0.0–2.0)
Basophils Absolute: 0.1 10*3/uL (ref 0.0–0.1)
EOS ABS: 0 10*3/uL (ref 0.0–0.5)
EOS%: 0.1 % (ref 0.0–7.0)
HCT: 35.6 % (ref 34.8–46.6)
HGB: 11.2 g/dL — ABNORMAL LOW (ref 11.6–15.9)
LYMPH#: 1.4 10*3/uL (ref 0.9–3.3)
LYMPH%: 7.7 % — ABNORMAL LOW (ref 14.0–49.7)
MCH: 31.2 pg (ref 25.1–34.0)
MCHC: 31.4 g/dL — ABNORMAL LOW (ref 31.5–36.0)
MCV: 99.5 fL (ref 79.5–101.0)
MONO#: 1.6 10*3/uL — AB (ref 0.1–0.9)
MONO%: 8.4 % (ref 0.0–14.0)
NEUT%: 83.5 % — ABNORMAL HIGH (ref 38.4–76.8)
NEUTROS ABS: 15.4 10*3/uL — AB (ref 1.5–6.5)
Platelets: 394 10*3/uL (ref 145–400)
RBC: 3.58 10*6/uL — AB (ref 3.70–5.45)
RDW: 21.4 % — AB (ref 11.2–14.5)
WBC: 18.4 10*3/uL — AB (ref 3.9–10.3)

## 2014-06-15 MED ORDER — SODIUM CHLORIDE 0.9 % IV SOLN
250.0000 mL | Freq: Once | INTRAVENOUS | Status: AC
  Administered 2014-06-15: 15:00:00 via INTRAVENOUS

## 2014-06-15 MED ORDER — SODIUM CHLORIDE 0.9 % IV SOLN
Freq: Once | INTRAVENOUS | Status: AC
  Administered 2014-06-15: 15:00:00 via INTRAVENOUS
  Filled 2014-06-15: qty 8

## 2014-06-15 MED ORDER — HEPARIN SOD (PORK) LOCK FLUSH 100 UNIT/ML IV SOLN
500.0000 [IU] | Freq: Once | INTRAVENOUS | Status: AC | PRN
  Administered 2014-06-15: 500 [IU]
  Filled 2014-06-15: qty 5

## 2014-06-15 MED ORDER — SODIUM CHLORIDE 0.9 % IJ SOLN
10.0000 mL | INTRAMUSCULAR | Status: DC | PRN
  Administered 2014-06-15: 10 mL
  Filled 2014-06-15: qty 10

## 2014-06-15 MED ORDER — SODIUM CHLORIDE 0.9 % IV SOLN
309.5000 mg | Freq: Once | INTRAVENOUS | Status: AC
  Administered 2014-06-15: 310 mg via INTRAVENOUS
  Filled 2014-06-15: qty 31

## 2014-06-15 MED ORDER — SODIUM CHLORIDE 0.9 % IV SOLN
100.0000 mg/m2 | Freq: Once | INTRAVENOUS | Status: AC
  Administered 2014-06-15: 130 mg via INTRAVENOUS
  Filled 2014-06-15: qty 6.5

## 2014-06-15 NOTE — [facility] Oncology Navigator Note (Unsigned)
I spoke with Alexa Curet, NP for pallaitive care.  She asked about possible home health. Alexa Stephens will follow up with her this early next week and call me back to get patient set up with home health.

## 2014-06-15 NOTE — Telephone Encounter (Signed)
pt called to confirm appt......done.Marland KitchenMarland Kitchen

## 2014-06-16 ENCOUNTER — Ambulatory Visit (HOSPITAL_BASED_OUTPATIENT_CLINIC_OR_DEPARTMENT_OTHER): Payer: Medicare Other

## 2014-06-16 VITALS — BP 114/59 | HR 108 | Temp 98.1°F | Resp 16

## 2014-06-16 DIAGNOSIS — Z5111 Encounter for antineoplastic chemotherapy: Secondary | ICD-10-CM | POA: Diagnosis present

## 2014-06-16 DIAGNOSIS — C7B8 Other secondary neuroendocrine tumors: Secondary | ICD-10-CM

## 2014-06-16 DIAGNOSIS — C7A8 Other malignant neuroendocrine tumors: Secondary | ICD-10-CM | POA: Diagnosis not present

## 2014-06-16 DIAGNOSIS — C3492 Malignant neoplasm of unspecified part of left bronchus or lung: Secondary | ICD-10-CM

## 2014-06-16 MED ORDER — PROCHLORPERAZINE MALEATE 10 MG PO TABS
ORAL_TABLET | ORAL | Status: AC
  Filled 2014-06-16: qty 1

## 2014-06-16 MED ORDER — PROCHLORPERAZINE MALEATE 10 MG PO TABS
10.0000 mg | ORAL_TABLET | Freq: Once | ORAL | Status: AC
  Administered 2014-06-16: 10 mg via ORAL

## 2014-06-16 MED ORDER — SODIUM CHLORIDE 0.9 % IV SOLN
Freq: Once | INTRAVENOUS | Status: AC
  Administered 2014-06-16: 14:00:00 via INTRAVENOUS

## 2014-06-16 MED ORDER — SODIUM CHLORIDE 0.9 % IJ SOLN
10.0000 mL | INTRAMUSCULAR | Status: DC | PRN
  Administered 2014-06-16: 10 mL
  Filled 2014-06-16: qty 10

## 2014-06-16 MED ORDER — SODIUM CHLORIDE 0.9 % IV SOLN
100.0000 mg/m2 | Freq: Once | INTRAVENOUS | Status: AC
  Administered 2014-06-16: 130 mg via INTRAVENOUS
  Filled 2014-06-16: qty 6.5

## 2014-06-16 MED ORDER — HEPARIN SOD (PORK) LOCK FLUSH 100 UNIT/ML IV SOLN
500.0000 [IU] | Freq: Once | INTRAVENOUS | Status: AC | PRN
  Administered 2014-06-16: 500 [IU]
  Filled 2014-06-16: qty 5

## 2014-06-16 NOTE — Patient Instructions (Signed)
[facility] Discharge Instructions for Patients Receiving Chemotherapy  Today you received the following chemotherapy agents Etoposide.  To help prevent nausea and vomiting after your treatment, we encourage you to take your nausea medication as prescribed.   If you develop nausea and vomiting that is not controlled by your nausea medication, call the clinic.   BELOW ARE SYMPTOMS THAT SHOULD BE REPORTED IMMEDIATELY:  *FEVER GREATER THAN 100.5 F  *CHILLS WITH OR WITHOUT FEVER  NAUSEA AND VOMITING THAT IS NOT CONTROLLED WITH YOUR NAUSEA MEDICATION  *UNUSUAL SHORTNESS OF BREATH  *UNUSUAL BRUISING OR BLEEDING  TENDERNESS IN MOUTH AND THROAT WITH OR WITHOUT PRESENCE OF ULCERS  *URINARY PROBLEMS  *BOWEL PROBLEMS  UNUSUAL RASH Items with * indicate a potential emergency and should be followed up as soon as possible.  Feel free to call the clinic you have any questions or concerns. The clinic phone number is [phone].  Please show the CHEMO ALERT CARD at check to the Emergency Department and triage nurse.   

## 2014-06-17 ENCOUNTER — Other Ambulatory Visit: Payer: Self-pay | Admitting: *Deleted

## 2014-06-17 ENCOUNTER — Ambulatory Visit: Payer: Medicare Other | Admitting: Pulmonary Disease

## 2014-06-17 ENCOUNTER — Ambulatory Visit (HOSPITAL_BASED_OUTPATIENT_CLINIC_OR_DEPARTMENT_OTHER): Payer: Medicare Other

## 2014-06-17 VITALS — BP 137/74 | HR 86 | Temp 98.4°F | Resp 18

## 2014-06-17 DIAGNOSIS — C7B8 Other secondary neuroendocrine tumors: Secondary | ICD-10-CM | POA: Diagnosis not present

## 2014-06-17 DIAGNOSIS — C7A8 Other malignant neuroendocrine tumors: Secondary | ICD-10-CM

## 2014-06-17 DIAGNOSIS — Z5111 Encounter for antineoplastic chemotherapy: Secondary | ICD-10-CM | POA: Diagnosis not present

## 2014-06-17 DIAGNOSIS — C3492 Malignant neoplasm of unspecified part of left bronchus or lung: Secondary | ICD-10-CM

## 2014-06-17 DIAGNOSIS — C3490 Malignant neoplasm of unspecified part of unspecified bronchus or lung: Secondary | ICD-10-CM

## 2014-06-17 MED ORDER — PROCHLORPERAZINE MALEATE 10 MG PO TABS
10.0000 mg | ORAL_TABLET | Freq: Once | ORAL | Status: AC
  Administered 2014-06-17: 10 mg via ORAL

## 2014-06-17 MED ORDER — DEXAMETHASONE 4 MG PO TABS: 4.0000 mg | ORAL_TABLET | Freq: Two times a day (BID) | ORAL | Status: AC

## 2014-06-17 MED ORDER — SODIUM CHLORIDE 0.9 % IV SOLN
Freq: Once | INTRAVENOUS | Status: AC
  Administered 2014-06-17: 16:00:00 via INTRAVENOUS

## 2014-06-17 MED ORDER — HEPARIN SOD (PORK) LOCK FLUSH 100 UNIT/ML IV SOLN
500.0000 [IU] | Freq: Once | INTRAVENOUS | Status: AC | PRN
  Administered 2014-06-17: 500 [IU]
  Filled 2014-06-17: qty 5

## 2014-06-17 MED ORDER — SODIUM CHLORIDE 0.9 % IV SOLN
100.0000 mg/m2 | Freq: Once | INTRAVENOUS | Status: AC
  Administered 2014-06-17: 130 mg via INTRAVENOUS
  Filled 2014-06-17: qty 6.5

## 2014-06-17 MED ORDER — PROCHLORPERAZINE MALEATE 10 MG PO TABS
ORAL_TABLET | ORAL | Status: AC
  Filled 2014-06-17: qty 1

## 2014-06-17 MED ORDER — DEXAMETHASONE 4 MG PO TABS: 4.0000 mg | ORAL_TABLET | Freq: Two times a day (BID) | ORAL | Status: DC

## 2014-06-17 MED ORDER — SODIUM CHLORIDE 0.9 % IJ SOLN
10.0000 mL | INTRAMUSCULAR | Status: DC | PRN
  Administered 2014-06-17: 10 mL
  Filled 2014-06-17: qty 10

## 2014-06-17 NOTE — Patient Instructions (Addendum)
[facility] Discharge Instructions for Patients Receiving Chemotherapy  Today you received the following chemotherapy agents: Etoposide   To help prevent nausea and vomiting after your treatment, we encourage you to take your nausea medication as directed.    If you develop nausea and vomiting that is not controlled by your nausea medication, call the clinic.   BELOW ARE SYMPTOMS THAT SHOULD BE REPORTED IMMEDIATELY:  *FEVER GREATER THAN 100.5 F  *CHILLS WITH OR WITHOUT FEVER  NAUSEA AND VOMITING THAT IS NOT CONTROLLED WITH YOUR NAUSEA MEDICATION  *UNUSUAL SHORTNESS OF BREATH  *UNUSUAL BRUISING OR BLEEDING  TENDERNESS IN MOUTH AND THROAT WITH OR WITHOUT PRESENCE OF ULCERS  *URINARY PROBLEMS  *BOWEL PROBLEMS  UNUSUAL RASH Items with * indicate a potential emergency and should be followed up as soon as possible.  Feel free to call the clinic you have any questions or concerns. The clinic phone number is [phone].  Please show the CHEMO ALERT CARD at check-in to the Emergency Department and triage nurse.   

## 2014-06-19 ENCOUNTER — Ambulatory Visit (HOSPITAL_BASED_OUTPATIENT_CLINIC_OR_DEPARTMENT_OTHER): Payer: Medicare Other

## 2014-06-19 VITALS — BP 119/65 | HR 100 | Temp 98.4°F

## 2014-06-19 DIAGNOSIS — Z5189 Encounter for other specified aftercare: Secondary | ICD-10-CM

## 2014-06-19 DIAGNOSIS — C7A8 Other malignant neuroendocrine tumors: Secondary | ICD-10-CM

## 2014-06-19 DIAGNOSIS — C3492 Malignant neoplasm of unspecified part of left bronchus or lung: Secondary | ICD-10-CM

## 2014-06-19 MED ORDER — PEGFILGRASTIM INJECTION 6 MG/0.6ML ~~LOC~~
6.0000 mg | PREFILLED_SYRINGE | Freq: Once | SUBCUTANEOUS | Status: AC
  Administered 2014-06-19: 6 mg via SUBCUTANEOUS
  Filled 2014-06-19: qty 0.6

## 2014-06-22 ENCOUNTER — Other Ambulatory Visit: Payer: Self-pay | Admitting: Nurse Practitioner

## 2014-06-22 ENCOUNTER — Telehealth: Payer: Self-pay | Admitting: *Deleted

## 2014-06-22 ENCOUNTER — Other Ambulatory Visit (HOSPITAL_BASED_OUTPATIENT_CLINIC_OR_DEPARTMENT_OTHER): Payer: Medicare Other

## 2014-06-22 DIAGNOSIS — C7B8 Other secondary neuroendocrine tumors: Secondary | ICD-10-CM | POA: Diagnosis not present

## 2014-06-22 DIAGNOSIS — C7A8 Other malignant neuroendocrine tumors: Secondary | ICD-10-CM

## 2014-06-22 DIAGNOSIS — C3492 Malignant neoplasm of unspecified part of left bronchus or lung: Secondary | ICD-10-CM

## 2014-06-22 LAB — CBC WITH DIFFERENTIAL/PLATELET
BASO%: 0.1 % (ref 0.0–2.0)
Basophils Absolute: 0 10*3/uL (ref 0.0–0.1)
EOS%: 0.1 % (ref 0.0–7.0)
Eosinophils Absolute: 0 10*3/uL (ref 0.0–0.5)
HCT: 32.1 % — ABNORMAL LOW (ref 34.8–46.6)
HGB: 10.1 g/dL — ABNORMAL LOW (ref 11.6–15.9)
LYMPH#: 0.9 10*3/uL (ref 0.9–3.3)
LYMPH%: 3.4 % — AB (ref 14.0–49.7)
MCH: 31.3 pg (ref 25.1–34.0)
MCHC: 31.5 g/dL (ref 31.5–36.0)
MCV: 99.5 fL (ref 79.5–101.0)
MONO#: 1 10*3/uL — AB (ref 0.1–0.9)
MONO%: 3.8 % (ref 0.0–14.0)
NEUT#: 24.9 10*3/uL — ABNORMAL HIGH (ref 1.5–6.5)
NEUT%: 92.6 % — AB (ref 38.4–76.8)
Platelets: 226 10*3/uL (ref 145–400)
RBC: 3.23 10*6/uL — ABNORMAL LOW (ref 3.70–5.45)
RDW: 20.5 % — ABNORMAL HIGH (ref 11.2–14.5)
WBC: 26.8 10*3/uL — ABNORMAL HIGH (ref 3.9–10.3)

## 2014-06-22 LAB — COMPREHENSIVE METABOLIC PANEL (CC13)
ALT: 47 U/L (ref 0–55)
ANION GAP: 11 meq/L (ref 3–11)
AST: 17 U/L (ref 5–34)
Albumin: 3.4 g/dL — ABNORMAL LOW (ref 3.5–5.0)
Alkaline Phosphatase: 219 U/L — ABNORMAL HIGH (ref 40–150)
BILIRUBIN TOTAL: 0.3 mg/dL (ref 0.20–1.20)
BUN: 19.6 mg/dL (ref 7.0–26.0)
CALCIUM: 8.6 mg/dL (ref 8.4–10.4)
CHLORIDE: 97 meq/L — AB (ref 98–109)
CO2: 34 meq/L — AB (ref 22–29)
Creatinine: 0.5 mg/dL — ABNORMAL LOW (ref 0.6–1.1)
Glucose: 137 mg/dl (ref 70–140)
Potassium: 4.3 mEq/L (ref 3.5–5.1)
Sodium: 141 mEq/L (ref 136–145)
Total Protein: 6.1 g/dL — ABNORMAL LOW (ref 6.4–8.3)

## 2014-06-22 NOTE — Telephone Encounter (Signed)
Pt's son stopped RN in waiting room with concerns about pt and requested pt to be seen for debilitating stomach pain, distention, ill feeling, and back pain which keeping her from be able to sleep. Requested Alexa Stephens fill out  Walk in form, which he did and had the above symptoms listed. Alexa Stephens states she has gained about 20-25 pounds in abdomin and I'd like to have some one take a look at her, examine her stomach and explain why this is happening, she is eating and drinking, incontinent of urine and BM's, forgetful and cannot sleep at night due to the back pain."  Asked about frequency of pain medication, Alexa Stephens stated "she does not take pain medicaton on a regular basis, she has had 3 cycles of chemo and it should be working by now, I think there is something else going on. Her stomach shouldn't be this swollen." Discussed with Alexa Stephens I will send message to scheduling requesting an appt for Alexa Stephens tomorrow, expect a call from scheduling with additional information. Alexa Stephens agreed he would bring pt to [facility] tomorrow to be evaluated. Concerns forward to MD for review.

## 2014-06-23 ENCOUNTER — Encounter (HOSPITAL_COMMUNITY): Payer: Self-pay

## 2014-06-23 ENCOUNTER — Other Ambulatory Visit: Payer: Self-pay

## 2014-06-23 ENCOUNTER — Inpatient Hospital Stay (HOSPITAL_COMMUNITY)
Admission: EM | Admit: 2014-06-23 | Discharge: 2014-07-06 | DRG: 193 | Disposition: A | Discharge status: Skilled Nursing Facility | Payer: Medicare Other | Attending: Internal Medicine | Admitting: Internal Medicine

## 2014-06-23 ENCOUNTER — Telehealth: Payer: Self-pay | Admitting: *Deleted

## 2014-06-23 ENCOUNTER — Ambulatory Visit (HOSPITAL_BASED_OUTPATIENT_CLINIC_OR_DEPARTMENT_OTHER): Payer: Medicare Other | Admitting: Nurse Practitioner

## 2014-06-23 DIAGNOSIS — Z66 Do not resuscitate: Secondary | ICD-10-CM

## 2014-06-23 DIAGNOSIS — Z6829 Body mass index (BMI) 29.0-29.9, adult: Secondary | ICD-10-CM

## 2014-06-23 DIAGNOSIS — R531 Weakness: Secondary | ICD-10-CM

## 2014-06-23 DIAGNOSIS — R0600 Dyspnea, unspecified: Secondary | ICD-10-CM

## 2014-06-23 DIAGNOSIS — Z87891 Personal history of nicotine dependence: Secondary | ICD-10-CM

## 2014-06-23 DIAGNOSIS — Z9981 Dependence on supplemental oxygen: Secondary | ICD-10-CM

## 2014-06-23 DIAGNOSIS — R160 Hepatomegaly, not elsewhere classified: Secondary | ICD-10-CM

## 2014-06-23 DIAGNOSIS — E872 Acidosis: Secondary | ICD-10-CM | POA: Diagnosis present

## 2014-06-23 DIAGNOSIS — C7931 Secondary malignant neoplasm of brain: Secondary | ICD-10-CM

## 2014-06-23 DIAGNOSIS — Z515 Encounter for palliative care: Secondary | ICD-10-CM

## 2014-06-23 DIAGNOSIS — E860 Dehydration: Secondary | ICD-10-CM | POA: Diagnosis not present

## 2014-06-23 DIAGNOSIS — J189 Pneumonia, unspecified organism: Principal | ICD-10-CM

## 2014-06-23 DIAGNOSIS — Z7189 Other specified counseling: Secondary | ICD-10-CM

## 2014-06-23 DIAGNOSIS — G893 Neoplasm related pain (acute) (chronic): Secondary | ICD-10-CM | POA: Diagnosis not present

## 2014-06-23 DIAGNOSIS — T380X5A Adverse effect of glucocorticoids and synthetic analogues, initial encounter: Secondary | ICD-10-CM | POA: Diagnosis present

## 2014-06-23 DIAGNOSIS — Z7952 Long term (current) use of systemic steroids: Secondary | ICD-10-CM

## 2014-06-23 DIAGNOSIS — K5900 Constipation, unspecified: Secondary | ICD-10-CM | POA: Diagnosis present

## 2014-06-23 DIAGNOSIS — R140 Abdominal distension (gaseous): Secondary | ICD-10-CM | POA: Diagnosis present

## 2014-06-23 DIAGNOSIS — J9690 Respiratory failure, unspecified, unspecified whether with hypoxia or hypercapnia: Secondary | ICD-10-CM

## 2014-06-23 DIAGNOSIS — J45909 Unspecified asthma, uncomplicated: Secondary | ICD-10-CM | POA: Diagnosis present

## 2014-06-23 DIAGNOSIS — R609 Edema, unspecified: Secondary | ICD-10-CM

## 2014-06-23 DIAGNOSIS — R1030 Lower abdominal pain, unspecified: Secondary | ICD-10-CM | POA: Diagnosis present

## 2014-06-23 DIAGNOSIS — Z79899 Other long term (current) drug therapy: Secondary | ICD-10-CM

## 2014-06-23 DIAGNOSIS — R634 Abnormal weight loss: Secondary | ICD-10-CM

## 2014-06-23 DIAGNOSIS — F329 Major depressive disorder, single episode, unspecified: Secondary | ICD-10-CM

## 2014-06-23 DIAGNOSIS — E873 Alkalosis: Secondary | ICD-10-CM

## 2014-06-23 DIAGNOSIS — R0602 Shortness of breath: Secondary | ICD-10-CM

## 2014-06-23 DIAGNOSIS — J449 Chronic obstructive pulmonary disease, unspecified: Secondary | ICD-10-CM

## 2014-06-23 DIAGNOSIS — C3490 Malignant neoplasm of unspecified part of unspecified bronchus or lung: Secondary | ICD-10-CM

## 2014-06-23 DIAGNOSIS — R188 Other ascites: Secondary | ICD-10-CM

## 2014-06-23 DIAGNOSIS — D539 Nutritional anemia, unspecified: Secondary | ICD-10-CM

## 2014-06-23 DIAGNOSIS — C787 Secondary malignant neoplasm of liver and intrahepatic bile duct: Secondary | ICD-10-CM

## 2014-06-23 DIAGNOSIS — J9622 Acute and chronic respiratory failure with hypercapnia: Secondary | ICD-10-CM | POA: Diagnosis present

## 2014-06-23 DIAGNOSIS — Y95 Nosocomial condition: Secondary | ICD-10-CM | POA: Diagnosis present

## 2014-06-23 DIAGNOSIS — B029 Zoster without complications: Secondary | ICD-10-CM

## 2014-06-23 DIAGNOSIS — R05 Cough: Secondary | ICD-10-CM

## 2014-06-23 DIAGNOSIS — R5381 Other malaise: Secondary | ICD-10-CM | POA: Diagnosis present

## 2014-06-23 DIAGNOSIS — E875 Hyperkalemia: Secondary | ICD-10-CM | POA: Diagnosis not present

## 2014-06-23 DIAGNOSIS — R64 Cachexia: Secondary | ICD-10-CM | POA: Diagnosis present

## 2014-06-23 DIAGNOSIS — R058 Other specified cough: Secondary | ICD-10-CM

## 2014-06-23 DIAGNOSIS — J0140 Acute pansinusitis, unspecified: Secondary | ICD-10-CM

## 2014-06-23 DIAGNOSIS — F419 Anxiety disorder, unspecified: Secondary | ICD-10-CM | POA: Diagnosis present

## 2014-06-23 DIAGNOSIS — C7951 Secondary malignant neoplasm of bone: Secondary | ICD-10-CM

## 2014-06-23 DIAGNOSIS — J441 Chronic obstructive pulmonary disease with (acute) exacerbation: Secondary | ICD-10-CM

## 2014-06-23 DIAGNOSIS — R109 Unspecified abdominal pain: Secondary | ICD-10-CM

## 2014-06-23 DIAGNOSIS — Z720 Tobacco use: Secondary | ICD-10-CM

## 2014-06-23 DIAGNOSIS — I10 Essential (primary) hypertension: Secondary | ICD-10-CM

## 2014-06-23 DIAGNOSIS — R251 Tremor, unspecified: Secondary | ICD-10-CM

## 2014-06-23 DIAGNOSIS — J9621 Acute and chronic respiratory failure with hypoxia: Secondary | ICD-10-CM

## 2014-06-23 DIAGNOSIS — F32A Depression, unspecified: Secondary | ICD-10-CM

## 2014-06-23 DIAGNOSIS — Z0000 Encounter for general adult medical examination without abnormal findings: Secondary | ICD-10-CM

## 2014-06-23 DIAGNOSIS — R739 Hyperglycemia, unspecified: Secondary | ICD-10-CM | POA: Diagnosis present

## 2014-06-23 DIAGNOSIS — J9611 Chronic respiratory failure with hypoxia: Secondary | ICD-10-CM

## 2014-06-23 DIAGNOSIS — C3492 Malignant neoplasm of unspecified part of left bronchus or lung: Secondary | ICD-10-CM

## 2014-06-23 DIAGNOSIS — E785 Hyperlipidemia, unspecified: Secondary | ICD-10-CM

## 2014-06-23 DIAGNOSIS — E8729 Other acidosis: Secondary | ICD-10-CM | POA: Diagnosis present

## 2014-06-23 DIAGNOSIS — R000 Tachycardia, unspecified: Secondary | ICD-10-CM

## 2014-06-23 MED ORDER — SODIUM CHLORIDE 0.9 % IV SOLN
1000.0000 mL | Freq: Once | INTRAVENOUS | Status: AC
  Administered 2014-06-23: 1000 mL via INTRAVENOUS

## 2014-06-23 MED ORDER — SODIUM CHLORIDE 0.9 % IV SOLN
1000.0000 mL | INTRAVENOUS | Status: DC
  Administered 2014-06-24: 1000 mL via INTRAVENOUS

## 2014-06-23 MED ORDER — SODIUM CHLORIDE 0.9 % IV BOLUS (SEPSIS)
600.0000 mL | Freq: Once | INTRAVENOUS | Status: AC
  Administered 2014-06-24: 600 mL via INTRAVENOUS

## 2014-06-23 NOTE — Telephone Encounter (Signed)
Spoke to [name]- pt is not doing any better. He is going to bring pt in to see [facility] right now. POF sent and appt made.

## 2014-06-23 NOTE — ED Notes (Signed)
Pt arrived via EMS from home, c/o SOB.  HX of lung cancer.  Given 35m albuterol via mask by EMS.  Family reports SOB since 1pm, gave home albuterol at 2pm.  Pt wears 3L O2 @ rest, 4L O2 when active.

## 2014-06-23 NOTE — ED Provider Notes (Addendum)
CSN: [id number]     Arrival date & time 06/23/14  2306 History  This chart was scribed for Delora Fuel, MD by Delphia Grates, ED Scribe. This patient was seen in room A01C/A01C and the patient's care was started at 11:41 PM.   Chief Complaint  Patient presents with  . Shortness of Breath    The history is provided by the patient and a relative. No language interpreter was used.     HPI Comments: Alexa Stephens is a 64 y.o. female, with history of COPD, pneumonia, asthma, and lung cancer, brought in by by ambulance, who presents to the Emergency Department complaining of worsening shortness of breath that began earlier today. Patient is from home and family states the patient's O2 saturation has been low and was measured at 78%. Family reports O2 saturation as low as in the 60s at times. They reports this is a new, sudden occurrence, stating her saturations is typically in the 95-100% range. Family also mentions abdominal distention and reports the patient has gained approximately 20 pounds in the last month. Family denies fever. Patient denies any pain at present, however, family states the patient has been complaining of chest pain throughout the day.  Past Medical History  Diagnosis Date  . Anxiety disorder   . Asthma   . COPD (chronic obstructive pulmonary disease)     Not on home O2 yet but is prescribed.  . Headache(784.0)   . Hyperlipemia   . Hypertension   . Pneumonia Sept 2013  . Sepsis Sept 2013  . Cancer     lung cancer    Past Surgical History  Procedure Laterality Date  . Cosmetic surgery    . Cystectomy    . Hand surgery     Family History  Problem Relation Age of Onset  . Lung cancer Maternal Grandfather   . Cancer Maternal Grandmother    History  Substance Use Topics  . Smoking status: Former Smoker -- 0.02 packs/day for 40 years    Types: Cigarettes    Quit date: 12/28/2013  . Smokeless tobacco: Not on file     Comment: 1 pack per 3-4 days 06/11/13-- QUIT  around November 2015  . Alcohol Use: No   OB History    No data available     Review of Systems  Respiratory: Positive for shortness of breath.   Gastrointestinal: Positive for abdominal distention.  All other systems reviewed and are negative.     Allergies  Benadryl; Compazine; Excedrin extra strength; Flexeril; Lexapro; Naproxen; Skelaxin; and Tylenol  Home Medications   Prior to Admission medications   Medication Sig Start Date End Date Taking? Authorizing Provider  albuterol (PROAIR HFA) 108 (90 BASE) MCG/ACT inhaler Inhale 2 puffs into the lungs every 6 (six) hours as needed for wheezing or shortness of breath. 05/11/14  Yes Laurie Panda, NP  albuterol (PROVENTIL) (2.5 MG/3ML) 0.083% nebulizer solution Inhale 3 mLs into the lungs every 6 (six) hours as needed for wheezing or shortness of breath.  01/07/14  Yes Historical Provider, MD  dexamethasone (DECADRON) 4 MG tablet Take 1 tablet (4 mg total) by mouth 2 (two) times daily. 06/17/14  Yes Curt Bears, MD  ipratropium (ATROVENT) 0.02 % nebulizer solution Take 2.5 mLs (500 mcg total) by nebulization every 6 (six) hours as needed (ICD10: J44.9). 12/23/13  Yes Chesley Mires, MD  Ipratropium-Albuterol (COMBIVENT RESPIMAT) 20-100 MCG/ACT AERS respimat INHALE 1 PUFF BY MOUTH EVERY 6 HOURS. 05/11/14  Yes Laurie Panda,  NP  lisinopril (PRINIVIL,ZESTRIL) 20 MG tablet Take 1 tablet (20 mg total) by mouth daily. 02/03/14  Yes Sean Hommel, DO  pantoprazole (PROTONIX) 40 MG tablet Take 1 tablet (40 mg total) by mouth daily. 04/30/14  Yes Curt Bears, MD  PRESCRIPTION MEDICATION She receives chemo treatments at the [facility] with Dr. [name]. She received Carboplatin '310mg'$  and Etoposide '130mg'$  on 04/20/14 and Etoposide '130mg'$  on 04/21/14.   Yes Historical Provider, MD  simvastatin (ZOCOR) 20 MG tablet Take 20 mg by mouth daily.   Yes Historical Provider, MD  clonazePAM (KLONOPIN) 1 MG tablet TAKE 1 TABLET BY MOUTH TWICE  DAILY AS NEEDED FOR ANXIETY Patient not taking: Reported on 06/24/2014 02/23/14   [name], DO  fluconazole (DIFLUCAN) 100 MG tablet Take 1 tablet (100 mg total) by mouth daily. Patient not taking: Reported on 06/24/2014 05/13/14   Curt Bears, MD  Fluticasone Furoate-Vilanterol (BREO ELLIPTA) 100-25 MCG/INH AEPB Inhale 1 puff into the lungs daily. Patient not taking: Reported on 06/24/2014 04/21/14   Tammy S Parrett, NP  HYDROcodone-homatropine (HYCODAN) 5-1.5 MG/5ML syrup Take 5 mLs by mouth every 6 (six) hours as needed for cough. Patient not taking: Reported on 06/24/2014 06/08/14   Curt Bears, MD  lidocaine-prilocaine (EMLA) cream Apply 1 application topically as needed. Patient not taking: Reported on 06/24/2014 05/11/14   Laurie Panda, NP  ondansetron (ZOFRAN) 8 MG tablet Take 1 tablet (8 mg total) by mouth every 8 (eight) hours as needed for nausea or vomiting. Patient not taking: Reported on 06/24/2014 04/21/14   Curt Bears, MD  oxyCODONE (OXY IR/ROXICODONE) 5 MG immediate release tablet Take 1 tablet (5 mg total) by mouth every 4 (four) hours as needed for severe pain. Patient not taking: Reported on 06/24/2014 05/06/14   Susanne Borders, NP  valACYclovir (VALTREX) 1000 MG tablet Take 1 tablet (1,000 mg total) by mouth 3 (three) times daily. Patient not taking: Reported on 06/24/2014 06/08/14   Curt Bears, MD   Triage Vitals: BP 166/78 mmHg  Pulse 124  Temp(Src) 98.4 F (36.9 C) (Oral)  Resp 26  Ht '5\' 1"'$  (1.549 m)  Wt 115 lb (52.164 kg)  BMI 21.74 kg/m2  SpO2 96%  Physical Exam  Constitutional:  Moderate respiratory distress.  Using accessory muscles of respiration.  HENT:  Head: Normocephalic and atraumatic.  Eyes: Conjunctivae and EOM are normal. Pupils are equal, round, and reactive to light.  Neck: Normal range of motion. Neck supple. No JVD present.  Cardiovascular: Regular rhythm and normal heart sounds.  Tachycardia present.   No murmur  heard. Tachycardic.  Pulmonary/Chest: She is in respiratory distress. She has no wheezes. She has rales. She exhibits no tenderness.  Decreased airflow diffusely with bibaslar rales.  Abdominal: Soft. Bowel sounds are normal. She exhibits distension. She exhibits no mass. There is no tenderness.  Distended with ascites present.  Musculoskeletal: Normal range of motion. She exhibits no edema.  Generally cachectic. No edema  Lymphadenopathy:    She has no cervical adenopathy.  Neurological: No cranial nerve deficit. Coordination normal.  Somewhat lethargic, but will answer questions.  Skin: Skin is warm and dry. No rash noted.  Psychiatric: She has a normal mood and affect.  Nursing note and vitals reviewed.   ED Course  Procedures (including critical care time)  DIAGNOSTIC STUDIES: Oxygen Saturation is 96% on 15 L/min [place], adequate by my interpretation.    COORDINATION OF CARE: At 2347 Discussed treatment plan with patient which includes imaging.  Patient agrees.   Labs Review Results for orders placed or performed during the hospital encounter of 06/23/14  Comprehensive metabolic panel  Result Value Ref Range   Sodium 136 135 - 145 mmol/L   Potassium 4.4 3.5 - 5.1 mmol/L   Chloride 91 (L) 96 - 112 mmol/L   CO2 34 (H) 19 - 32 mmol/L   Glucose, Bld 180 (H) 70 - 99 mg/dL   BUN 23 6 - 23 mg/dL   Creatinine, Ser 0.36 (L) 0.50 - 1.10 mg/dL   Calcium 8.8 8.4 - 10.5 mg/dL   Total Protein 6.3 6.0 - 8.3 g/dL   Albumin 3.5 3.5 - 5.2 g/dL   AST 29 0 - 37 U/L   ALT 51 (H) 0 - 35 U/L   Alkaline Phosphatase 149 (H) 39 - 117 U/L   Total Bilirubin 0.5 0.3 - 1.2 mg/dL   GFR calc non Af Amer >90 >90 mL/min   GFR calc Af Amer >90 >90 mL/min   Anion gap 11 5 - 15  CBC with Differential  Result Value Ref Range   WBC 7.1 4.0 - 10.5 K/uL   RBC 3.52 (L) 3.87 - 5.11 MIL/uL   Hemoglobin 11.4 (L) 12.0 - 15.0 g/dL   HCT 35.6 (L) 36.0 - 46.0 %   MCV 101.1 (H) 78.0 - 100.0 fL   MCH 32.4 26.0 -  34.0 pg   MCHC 32.0 30.0 - 36.0 g/dL   RDW 19.8 (H) 11.5 - 15.5 %   Platelets 176 150 - 400 K/uL   Neutrophils Relative % 59 43 - 77 %   Lymphocytes Relative 38 12 - 46 %   Monocytes Relative 2 (L) 3 - 12 %   Eosinophils Relative 1 0 - 5 %   Basophils Relative 0 0 - 1 %   Band Neutrophils 0 0 - 10 %   Metamyelocytes Relative 0 %   Myelocytes 0 %   Promyelocytes Absolute 0 %   Blasts 0 %   nRBC 2 (H) 0 /100 WBC   Neutro Abs 4.2 1.7 - 7.7 K/uL   Lymphs Abs 2.7 0.7 - 4.0 K/uL   Monocytes Absolute 0.1 0.1 - 1.0 K/uL   Eosinophils Absolute 0.1 0.0 - 0.7 K/uL   Basophils Absolute 0.0 0.0 - 0.1 K/uL   RBC Morphology POLYCHROMASIA PRESENT    WBC Morphology INCREASED BANDS (>20% BANDS)   I-Stat CG4 Lactic Acid, ED (Not at [facility] or [facility])  Result Value Ref Range   Lactic Acid, Venous 1.70 0.5 - 2.0 mmol/L   Imaging Review Dg Chest Port 1 View  06/24/2014   CLINICAL DATA:  Acute onset of shortness of breath. Initial encounter.  EXAM: PORTABLE CHEST - 1 VIEW  COMPARISON:  PET/CT performed 04/30/2014, and CT of the chest performed 04/03/2014  FINDINGS: The lungs are well-aerated. Milder left basilar airspace opacity raises concern for pneumonia. There is no evidence of pleural effusion or pneumothorax.  Known metastatic disease is not well characterized on radiograph. The cardiomediastinal silhouette is within normal limits. No acute osseous abnormalities are seen. A right-sided chest port is noted ending about the distal SVC.  IMPRESSION: Mild left basilar airspace opacity raises concern for pneumonia.   Electronically Signed   By: Garald Balding M.D.   On: 06/24/2014 00:57   Images viewed by me.   EKG Interpretation   Date/Time:  Tuesday June 23 2014 23:11:35 EDT Ventricular Rate:  126 PR Interval:  111 QRS Duration: 82 QT Interval:  292 QTC Calculation: 423 R Axis:   82 Text Interpretation:  Sinus tachycardia Probable left atrial enlargement  Borderline right axis deviation Artifact in  lead(s) II III aVR aVL aVF V1  V2 V4 V5 V6 When compared with ECG of 11/17/2011, No significant change was  found Confirmed by [facility]  MD, Shloimy Michalski ([id number]) on 06/23/2014 11:19:39 PM      CRITICAL CARE Performed by: [email or web address] Total critical care time: 40 minutes Critical care time was exclusive of separately billable procedures and treating other patients. Critical care was necessary to treat or prevent imminent or life-threatening deterioration. Critical care was time spent personally by me on the following activities: development of treatment plan with patient and/or surrogate as well as nursing, discussions with consultants, evaluation of patient's response to treatment, examination of patient, obtaining history from patient or surrogate, ordering and performing treatments and interventions, ordering and review of laboratory studies, ordering and review of radiographic studies, pulse oximetry and re-evaluation of patient's condition.  MDM   Final diagnoses:  SOB (shortness of breath)  Cough with sputum  Community acquired pneumonia  Small cell lung carcinoma, left  Metabolic alkalosis  Macrocytic anemia    Acute dyspnea and patient being treated for small cell carcinoma of the lung. Old records are reviewed confirming recent diagnosis of small cell carcinoma the lung treated with outpatient chemotherapy. Recent visit with social service resulted in referral for palliative care. Family member with power of attorney is here states that they have not come to a final decision regarding how aggressive to treat her but would want her intubated.  Chest x-ray shows left lower lobe pneumonia. She has not been inpatient at any point so she was started on antibiotics for community-acquired pneumonia. She did meet this criteria for level to sepsis on arrival to the ED and was given early goal-directed of fluid therapy but lactic acid is come back normal. Following this, she was somewhat more alert. Case  is discussed with Dr. Ernestina Patches of [facility] who agrees to admit the patient.   I personally performed the services described in this documentation, which was scribed in my presence. The recorded information has been reviewed and is accurate.       Delora Fuel, MD 33/00/76 2263  Delora Fuel, MD 33/54/56 2563

## 2014-06-24 ENCOUNTER — Encounter: Payer: Self-pay | Admitting: Nurse Practitioner

## 2014-06-24 ENCOUNTER — Emergency Department (HOSPITAL_COMMUNITY): Payer: Medicare Other

## 2014-06-24 ENCOUNTER — Telehealth: Payer: Self-pay | Admitting: Medical Oncology

## 2014-06-24 ENCOUNTER — Inpatient Hospital Stay (HOSPITAL_COMMUNITY): Payer: Medicare Other

## 2014-06-24 DIAGNOSIS — E860 Dehydration: Secondary | ICD-10-CM | POA: Diagnosis not present

## 2014-06-24 DIAGNOSIS — Z79899 Other long term (current) drug therapy: Secondary | ICD-10-CM | POA: Diagnosis not present

## 2014-06-24 DIAGNOSIS — C787 Secondary malignant neoplasm of liver and intrahepatic bile duct: Secondary | ICD-10-CM | POA: Diagnosis present

## 2014-06-24 DIAGNOSIS — C7951 Secondary malignant neoplasm of bone: Secondary | ICD-10-CM | POA: Diagnosis present

## 2014-06-24 DIAGNOSIS — R609 Edema, unspecified: Secondary | ICD-10-CM | POA: Insufficient documentation

## 2014-06-24 DIAGNOSIS — E872 Acidosis: Secondary | ICD-10-CM | POA: Diagnosis not present

## 2014-06-24 DIAGNOSIS — I509 Heart failure, unspecified: Secondary | ICD-10-CM | POA: Diagnosis not present

## 2014-06-24 DIAGNOSIS — R0602 Shortness of breath: Secondary | ICD-10-CM | POA: Diagnosis present

## 2014-06-24 DIAGNOSIS — G893 Neoplasm related pain (acute) (chronic): Secondary | ICD-10-CM | POA: Insufficient documentation

## 2014-06-24 DIAGNOSIS — E873 Alkalosis: Secondary | ICD-10-CM | POA: Diagnosis present

## 2014-06-24 DIAGNOSIS — C3492 Malignant neoplasm of unspecified part of left bronchus or lung: Secondary | ICD-10-CM | POA: Diagnosis present

## 2014-06-24 DIAGNOSIS — I10 Essential (primary) hypertension: Secondary | ICD-10-CM | POA: Diagnosis present

## 2014-06-24 DIAGNOSIS — Z7952 Long term (current) use of systemic steroids: Secondary | ICD-10-CM | POA: Diagnosis not present

## 2014-06-24 DIAGNOSIS — Z9981 Dependence on supplemental oxygen: Secondary | ICD-10-CM | POA: Diagnosis not present

## 2014-06-24 DIAGNOSIS — F419 Anxiety disorder, unspecified: Secondary | ICD-10-CM | POA: Diagnosis present

## 2014-06-24 DIAGNOSIS — C7931 Secondary malignant neoplasm of brain: Secondary | ICD-10-CM | POA: Diagnosis present

## 2014-06-24 DIAGNOSIS — J449 Chronic obstructive pulmonary disease, unspecified: Secondary | ICD-10-CM | POA: Diagnosis present

## 2014-06-24 DIAGNOSIS — K5900 Constipation, unspecified: Secondary | ICD-10-CM | POA: Diagnosis present

## 2014-06-24 DIAGNOSIS — Z515 Encounter for palliative care: Secondary | ICD-10-CM | POA: Diagnosis not present

## 2014-06-24 DIAGNOSIS — Z87891 Personal history of nicotine dependence: Secondary | ICD-10-CM | POA: Diagnosis not present

## 2014-06-24 DIAGNOSIS — R5381 Other malaise: Secondary | ICD-10-CM | POA: Diagnosis present

## 2014-06-24 DIAGNOSIS — Z6829 Body mass index (BMI) 29.0-29.9, adult: Secondary | ICD-10-CM | POA: Diagnosis not present

## 2014-06-24 DIAGNOSIS — Z66 Do not resuscitate: Secondary | ICD-10-CM | POA: Diagnosis present

## 2014-06-24 DIAGNOSIS — R140 Abdominal distension (gaseous): Secondary | ICD-10-CM | POA: Diagnosis present

## 2014-06-24 DIAGNOSIS — C3491 Malignant neoplasm of unspecified part of right bronchus or lung: Secondary | ICD-10-CM | POA: Diagnosis not present

## 2014-06-24 DIAGNOSIS — C3490 Malignant neoplasm of unspecified part of unspecified bronchus or lung: Secondary | ICD-10-CM | POA: Diagnosis not present

## 2014-06-24 DIAGNOSIS — R109 Unspecified abdominal pain: Secondary | ICD-10-CM | POA: Insufficient documentation

## 2014-06-24 DIAGNOSIS — J9622 Acute and chronic respiratory failure with hypercapnia: Secondary | ICD-10-CM | POA: Diagnosis present

## 2014-06-24 DIAGNOSIS — D539 Nutritional anemia, unspecified: Secondary | ICD-10-CM | POA: Diagnosis present

## 2014-06-24 DIAGNOSIS — J189 Pneumonia, unspecified organism: Secondary | ICD-10-CM | POA: Diagnosis present

## 2014-06-24 DIAGNOSIS — R0600 Dyspnea, unspecified: Secondary | ICD-10-CM | POA: Diagnosis not present

## 2014-06-24 DIAGNOSIS — J45909 Unspecified asthma, uncomplicated: Secondary | ICD-10-CM | POA: Diagnosis present

## 2014-06-24 DIAGNOSIS — T380X5A Adverse effect of glucocorticoids and synthetic analogues, initial encounter: Secondary | ICD-10-CM | POA: Diagnosis present

## 2014-06-24 DIAGNOSIS — R531 Weakness: Secondary | ICD-10-CM | POA: Insufficient documentation

## 2014-06-24 DIAGNOSIS — R739 Hyperglycemia, unspecified: Secondary | ICD-10-CM | POA: Diagnosis present

## 2014-06-24 DIAGNOSIS — J9621 Acute and chronic respiratory failure with hypoxia: Secondary | ICD-10-CM | POA: Diagnosis present

## 2014-06-24 DIAGNOSIS — R000 Tachycardia, unspecified: Secondary | ICD-10-CM | POA: Diagnosis not present

## 2014-06-24 DIAGNOSIS — R64 Cachexia: Secondary | ICD-10-CM | POA: Diagnosis present

## 2014-06-24 DIAGNOSIS — Y95 Nosocomial condition: Secondary | ICD-10-CM | POA: Diagnosis present

## 2014-06-24 DIAGNOSIS — E875 Hyperkalemia: Secondary | ICD-10-CM | POA: Diagnosis not present

## 2014-06-24 LAB — CBC WITH DIFFERENTIAL/PLATELET
BASOS PCT: 0 % (ref 0–1)
Band Neutrophils: 0 % (ref 0–10)
Basophils Absolute: 0 10*3/uL (ref 0.0–0.1)
Basophils Absolute: 0 10*3/uL (ref 0.0–0.1)
Basophils Relative: 0 % (ref 0–1)
Blasts: 0 %
EOS PCT: 1 % (ref 0–5)
Eosinophils Absolute: 0.1 10*3/uL (ref 0.0–0.7)
Eosinophils Absolute: 0 10*3/uL (ref 0.0–0.7)
Eosinophils Relative: 1 % (ref 0–5)
HCT: 35.6 % — ABNORMAL LOW (ref 36.0–46.0)
HEMATOCRIT: 31.4 % — AB (ref 36.0–46.0)
Hemoglobin: 11.4 g/dL — ABNORMAL LOW (ref 12.0–15.0)
Hemoglobin: 9.9 g/dL — ABNORMAL LOW (ref 12.0–15.0)
Lymphocytes Relative: 38 % (ref 12–46)
Lymphocytes Relative: 44 % (ref 12–46)
Lymphs Abs: 2.7 10*3/uL (ref 0.7–4.0)
Lymphs Abs: 1.4 10*3/uL (ref 0.7–4.0)
MCH: 31.6 pg (ref 26.0–34.0)
MCH: 32.4 pg (ref 26.0–34.0)
MCHC: 31.5 g/dL (ref 30.0–36.0)
MCHC: 32 g/dL (ref 30.0–36.0)
MCV: 100.3 fL — AB (ref 78.0–100.0)
MCV: 101.1 fL — AB (ref 78.0–100.0)
METAMYELOCYTES PCT: 0 %
MONO ABS: 0.1 10*3/uL (ref 0.1–1.0)
MONO ABS: 0.1 10*3/uL (ref 0.1–1.0)
Monocytes Relative: 2 % — ABNORMAL LOW (ref 3–12)
Monocytes Relative: 2 % — ABNORMAL LOW (ref 3–12)
Myelocytes: 0 %
NEUTROS ABS: 1.7 10*3/uL (ref 1.7–7.7)
NEUTROS PCT: 59 % (ref 43–77)
NRBC: 2 /100{WBCs} — AB
Neutro Abs: 4.2 10*3/uL (ref 1.7–7.7)
Neutrophils Relative %: 53 % (ref 43–77)
PLATELETS: 147 10*3/uL — AB (ref 150–400)
Platelets: 176 10*3/uL (ref 150–400)
Promyelocytes Absolute: 0 %
RBC: 3.52 MIL/uL — ABNORMAL LOW (ref 3.87–5.11)
RBC: 3.13 MIL/uL — AB (ref 3.87–5.11)
RDW: 19.6 % — ABNORMAL HIGH (ref 11.5–15.5)
RDW: 19.8 % — AB (ref 11.5–15.5)
WBC MORPHOLOGY: INCREASED
WBC: 7.1 10*3/uL (ref 4.0–10.5)
WBC: 3.2 10*3/uL — ABNORMAL LOW (ref 4.0–10.5)

## 2014-06-24 LAB — COMPREHENSIVE METABOLIC PANEL
ALBUMIN: 3.5 g/dL (ref 3.5–5.2)
ALK PHOS: 133 U/L — AB (ref 39–117)
ALT: 51 U/L — ABNORMAL HIGH (ref 0–35)
ALT: 46 U/L — AB (ref 0–35)
ANION GAP: 11 (ref 5–15)
AST: 29 U/L (ref 0–37)
AST: 22 U/L (ref 0–37)
Albumin: 2.9 g/dL — ABNORMAL LOW (ref 3.5–5.2)
Alkaline Phosphatase: 149 U/L — ABNORMAL HIGH (ref 39–117)
Anion gap: 6 (ref 5–15)
BILIRUBIN TOTAL: 0.2 mg/dL — AB (ref 0.3–1.2)
BUN: 21 mg/dL (ref 6–23)
BUN: 23 mg/dL (ref 6–23)
CHLORIDE: 96 mmol/L (ref 96–112)
CO2: 34 mmol/L — ABNORMAL HIGH (ref 19–32)
CO2: 34 mmol/L — ABNORMAL HIGH (ref 19–32)
Calcium: 8.8 mg/dL (ref 8.4–10.5)
Calcium: 8 mg/dL — ABNORMAL LOW (ref 8.4–10.5)
Chloride: 91 mmol/L — ABNORMAL LOW (ref 96–112)
Creatinine, Ser: 0.36 mg/dL — ABNORMAL LOW (ref 0.50–1.10)
Creatinine, Ser: 0.35 mg/dL — ABNORMAL LOW (ref 0.50–1.10)
GFR calc non Af Amer: >90 mL/min (ref >90)
GFR calc non Af Amer: >90 mL/min (ref >90)
GLUCOSE: 255 mg/dL — AB (ref 70–99)
Glucose, Bld: 180 mg/dL — ABNORMAL HIGH (ref 70–99)
POTASSIUM: 4.3 mmol/L (ref 3.5–5.1)
POTASSIUM: 4.4 mmol/L (ref 3.5–5.1)
SODIUM: 136 mmol/L (ref 135–145)
Sodium: 136 mmol/L (ref 135–145)
TOTAL PROTEIN: 5.3 g/dL — AB (ref 6.0–8.3)
TOTAL PROTEIN: 6.3 g/dL (ref 6.0–8.3)
Total Bilirubin: 0.5 mg/dL (ref 0.3–1.2)

## 2014-06-24 LAB — URINALYSIS, ROUTINE W REFLEX MICROSCOPIC
BILIRUBIN URINE: NEGATIVE
Glucose, UA: 250 mg/dL — AB
KETONES UR: NEGATIVE mg/dL
NITRITE: NEGATIVE
Protein, ur: NEGATIVE mg/dL
SPECIFIC GRAVITY, URINE: 1.019 (ref 1.005–1.030)
UROBILINOGEN UA: 0.2 mg/dL (ref 0.0–1.0)
pH: 6 (ref 5.0–8.0)

## 2014-06-24 LAB — I-STAT ARTERIAL BLOOD GAS, ED
ACID-BASE EXCESS: 4 mmol/L — AB (ref 0.0–2.0)
ACID-BASE EXCESS: 6 mmol/L — AB (ref 0.0–2.0)
BICARBONATE: 33.5 meq/L — AB (ref 20.0–24.0)
Bicarbonate: 34.5 mEq/L — ABNORMAL HIGH (ref 20.0–24.0)
O2 Saturation: 98 %
O2 Saturation: 97 %
PH ART: 7.22 — AB (ref 7.350–7.450)
TCO2: 36 mmol/L (ref 0–100)
TCO2: 37 mmol/L (ref 0–100)
pCO2 arterial: 81.7 mmHg (ref 35.0–45.0)
pCO2 arterial: 74.7 mmHg (ref 35.0–45.0)
pH, Arterial: 7.273 — ABNORMAL LOW (ref 7.350–7.450)
pO2, Arterial: 122 mmHg — ABNORMAL HIGH (ref 80.0–100.0)
pO2, Arterial: 104 mmHg — ABNORMAL HIGH (ref 80.0–100.0)

## 2014-06-24 LAB — I-STAT CG4 LACTIC ACID, ED
Lactic Acid, Venous: 0.96 mmol/L (ref 0.5–2.0)
Lactic Acid, Venous: 1.7 mmol/L (ref 0.5–2.0)

## 2014-06-24 LAB — STREP PNEUMONIAE URINARY ANTIGEN: Strep Pneumo Urinary Antigen: NEGATIVE

## 2014-06-24 LAB — URINE MICROSCOPIC-ADD ON

## 2014-06-24 LAB — HIV ANTIBODY (ROUTINE TESTING W REFLEX): HIV Screen 4th Generation wRfx: NONREACTIVE

## 2014-06-24 LAB — MRSA PCR SCREENING: MRSA BY PCR: POSITIVE — AB

## 2014-06-24 MED ORDER — IPRATROPIUM BROMIDE 0.02 % IN SOLN: 500.0000 ug | Freq: Four times a day (QID) | RESPIRATORY_TRACT | Status: DC | PRN

## 2014-06-24 MED ORDER — CHLORHEXIDINE GLUCONATE CLOTH 2 % EX PADS
6.0000 | MEDICATED_PAD | Freq: Every day | CUTANEOUS | Status: AC
  Administered 2014-06-25 – 2014-06-29 (×5): 6 via TOPICAL

## 2014-06-24 MED ORDER — IOHEXOL 300 MG/ML  SOLN: 50.0000 mL | Freq: Once | INTRAMUSCULAR | Status: DC | PRN

## 2014-06-24 MED ORDER — LISINOPRIL 20 MG PO TABS
20.0000 mg | ORAL_TABLET | Freq: Every day | ORAL | Status: DC
  Administered 2014-06-24: 20 mg via ORAL
  Filled 2014-06-24: qty 1

## 2014-06-24 MED ORDER — DEXAMETHASONE 4 MG PO TABS
4.0000 mg | ORAL_TABLET | Freq: Two times a day (BID) | ORAL | Status: DC
  Administered 2014-06-24 – 2014-07-06 (×26): 4 mg via ORAL
  Filled 2014-06-24 (×34): qty 1

## 2014-06-24 MED ORDER — IOHEXOL 300 MG/ML  SOLN
80.0000 mL | Freq: Once | INTRAMUSCULAR | Status: AC | PRN
  Administered 2014-06-24: 80 mL via INTRAVENOUS

## 2014-06-24 MED ORDER — DEXTROSE 5 % IV SOLN
1.0000 g | INTRAVENOUS | Status: DC
  Administered 2014-06-24: 1 g via INTRAVENOUS
  Filled 2014-06-24: qty 10

## 2014-06-24 MED ORDER — HEPARIN SODIUM (PORCINE) 5000 UNIT/ML IJ SOLN
5000.0000 [IU] | Freq: Three times a day (TID) | INTRAMUSCULAR | Status: DC
  Administered 2014-06-24 – 2014-07-06 (×35): 5000 [IU] via SUBCUTANEOUS
  Filled 2014-06-24 (×34): qty 1

## 2014-06-24 MED ORDER — SIMVASTATIN 20 MG PO TABS
20.0000 mg | ORAL_TABLET | Freq: Every day | ORAL | Status: DC
  Administered 2014-06-24 – 2014-07-06 (×13): 20 mg via ORAL
  Filled 2014-06-24 (×13): qty 1

## 2014-06-24 MED ORDER — DEXTROSE 5 % IV SOLN
500.0000 mg | INTRAVENOUS | Status: DC
  Administered 2014-06-24: 500 mg via INTRAVENOUS
  Filled 2014-06-24: qty 500

## 2014-06-24 MED ORDER — PIPERACILLIN-TAZOBACTAM 3.375 G IVPB 30 MIN
3.3750 g | Freq: Once | INTRAVENOUS | Status: AC
  Administered 2014-06-24: 3.375 g via INTRAVENOUS
  Filled 2014-06-24: qty 50

## 2014-06-24 MED ORDER — PANTOPRAZOLE SODIUM 40 MG PO TBEC
40.0000 mg | DELAYED_RELEASE_TABLET | Freq: Every day | ORAL | Status: DC
  Administered 2014-06-24 – 2014-07-06 (×13): 40 mg via ORAL
  Filled 2014-06-24 (×14): qty 1

## 2014-06-24 MED ORDER — IPRATROPIUM-ALBUTEROL 0.5-2.5 (3) MG/3ML IN SOLN
3.0000 mL | RESPIRATORY_TRACT | Status: DC | PRN
  Administered 2014-06-24: 3 mL via RESPIRATORY_TRACT
  Filled 2014-06-24: qty 3

## 2014-06-24 MED ORDER — LORAZEPAM 2 MG/ML IJ SOLN
1.0000 mg | INTRAMUSCULAR | Status: DC | PRN
  Administered 2014-06-24: 1 mg via INTRAVENOUS
  Administered 2014-06-25 (×2): 2 mg via INTRAVENOUS
  Administered 2014-06-26: 1 mg via INTRAVENOUS
  Administered 2014-06-26: 2 mg via INTRAVENOUS
  Administered 2014-06-27: 1 mg via INTRAVENOUS
  Administered 2014-06-28: 2 mg via INTRAVENOUS
  Administered 2014-06-29 – 2014-06-30 (×4): 1 mg via INTRAVENOUS
  Administered 2014-07-01 – 2014-07-02 (×3): 2 mg via INTRAVENOUS
  Filled 2014-06-24 (×15): qty 1

## 2014-06-24 MED ORDER — DEXTROSE 5 % IV SOLN
1.0000 g | Freq: Once | INTRAVENOUS | Status: AC
  Administered 2014-06-24: 1 g via INTRAVENOUS
  Filled 2014-06-24: qty 10

## 2014-06-24 MED ORDER — SODIUM CHLORIDE 0.9 % IV SOLN
INTRAVENOUS | Status: DC
  Administered 2014-06-24 – 2014-06-26 (×3): via INTRAVENOUS
  Administered 2014-06-27: 60 mL/h via INTRAVENOUS

## 2014-06-24 MED ORDER — MUPIROCIN 2 % EX OINT
1.0000 "application " | TOPICAL_OINTMENT | Freq: Two times a day (BID) | CUTANEOUS | Status: AC
  Administered 2014-06-24 – 2014-06-29 (×10): 1 via NASAL
  Filled 2014-06-24 (×2): qty 22

## 2014-06-24 MED ORDER — LORAZEPAM 2 MG/ML IJ SOLN
INTRAMUSCULAR | Status: AC
  Filled 2014-06-24: qty 1

## 2014-06-24 MED ORDER — PIPERACILLIN-TAZOBACTAM 3.375 G IVPB
3.3750 g | Freq: Three times a day (TID) | INTRAVENOUS | Status: AC
  Administered 2014-06-24 – 2014-07-03 (×29): 3.375 g via INTRAVENOUS
  Filled 2014-06-24 (×30): qty 50

## 2014-06-24 MED ORDER — ALBUTEROL SULFATE HFA 108 (90 BASE) MCG/ACT IN AERS: 2.0000 | INHALATION_SPRAY | Freq: Four times a day (QID) | RESPIRATORY_TRACT | Status: DC | PRN

## 2014-06-24 MED ORDER — DEXTROSE 5 % IV SOLN
500.0000 mg | Freq: Once | INTRAVENOUS | Status: AC
  Administered 2014-06-24: 500 mg via INTRAVENOUS
  Filled 2014-06-24: qty 500

## 2014-06-24 MED ORDER — IPRATROPIUM-ALBUTEROL 0.5-2.5 (3) MG/3ML IN SOLN
3.0000 mL | Freq: Four times a day (QID) | RESPIRATORY_TRACT | Status: DC
  Administered 2014-06-24 (×2): 3 mL via RESPIRATORY_TRACT
  Filled 2014-06-24 (×2): qty 3

## 2014-06-24 MED ORDER — LORAZEPAM 2 MG/ML IJ SOLN
0.5000 mg | Freq: Once | INTRAMUSCULAR | Status: AC
  Administered 2014-06-24: 0.5 mg via INTRAVENOUS

## 2014-06-24 MED ORDER — SODIUM CHLORIDE 0.9 % IV SOLN
500.0000 mg | Freq: Two times a day (BID) | INTRAVENOUS | Status: DC
  Administered 2014-06-24 – 2014-06-27 (×7): 500 mg via INTRAVENOUS
  Filled 2014-06-24 (×9): qty 500

## 2014-06-24 MED ORDER — ALBUTEROL SULFATE (2.5 MG/3ML) 0.083% IN NEBU: 2.5000 mg | INHALATION_SOLUTION | RESPIRATORY_TRACT | Status: DC | PRN

## 2014-06-24 NOTE — Progress Notes (Signed)
SYMPTOM MANAGEMENT CLINIC   HPI: Alexa Stephens 64 y.o. female diagnosed with lung cancer; with both brain and bone metastasis.  Currently undergoing carboplatin/etoposide chemotherapy regimen.  Patient is complaining of some mild lower abdominal discomfort; that she thinks may be related to constipation.  She is also complaining of some increased generalized edema; with specific edema to her face; as well as weight cane for the past several weeks.  She continues to take dexamethasone 4 mg twice daily.  She is also complaining of some increased fatigue and weakness recently as well.  She denies any recent fevers or chills.   HPI  ROS  Past Medical History  Diagnosis Date  . Anxiety disorder   . Asthma   . COPD (chronic obstructive pulmonary disease)     Not on home O2 yet but is prescribed.  . Headache(784.0)   . Hyperlipemia   . Hypertension   . Pneumonia Sept 2013  . Sepsis Sept 2013  . Cancer     lung cancer     Past Surgical History  Procedure Laterality Date  . Cosmetic surgery    . Cystectomy    . Hand surgery      has COPD exacerbation; Chronic respiratory failure with hypoxia; Tobacco abuse; Health care maintenance; HTN (hypertension); HLD (hyperlipidemia); Chronic obstructive pulmonary disease (COPD); Migraine headache; Tremor; Constipation; Anemia; Anxiety and depression; Rapid weight loss; Liver masses; Small cell lung carcinoma; Sinusitis; Shingles; Acute on chronic respiratory failure with hypoxia; Abdominal distension; Pain in the abdomen; Weakness; Cancer associated pain; and Edema on her problem list.    is allergic to benadryl; compazine; excedrin extra strength; flexeril; lexapro; naproxen; skelaxin; and tylenol.    Medication List       This list is accurate as of: 06/23/14 11:06 PM.  Always use your most recent med list.               albuterol (2.5 MG/3ML) 0.083% nebulizer solution  Commonly known as:  PROVENTIL  Inhale 3 mLs into the lungs  every 6 (six) hours as needed for wheezing or shortness of breath.     albuterol 108 (90 BASE) MCG/ACT inhaler  Commonly known as:  PROAIR HFA  Inhale 2 puffs into the lungs every 6 (six) hours as needed for wheezing or shortness of breath.     calcium-vitamin D 500-200 MG-UNIT per tablet  Take 1 tablet by mouth daily. When remembers     clonazePAM 1 MG tablet  Commonly known as:  KLONOPIN  TAKE 1 TABLET BY MOUTH TWICE DAILY AS NEEDED FOR ANXIETY     dexamethasone 4 MG tablet  Commonly known as:  DECADRON  Take 1 tablet (4 mg total) by mouth 2 (two) times daily.     fluconazole 100 MG tablet  Commonly known as:  DIFLUCAN  Take 1 tablet (100 mg total) by mouth daily.     Fluticasone Furoate-Vilanterol 100-25 MCG/INH Aepb  Commonly known as:  BREO ELLIPTA  Inhale 1 puff into the lungs daily.     HYDROcodone-homatropine 5-1.5 MG/5ML syrup  Commonly known as:  HYCODAN  Take 5 mLs by mouth every 6 (six) hours as needed for cough.     ipratropium 0.02 % nebulizer solution  Commonly known as:  ATROVENT  Take 2.5 mLs (500 mcg total) by nebulization every 6 (six) hours as needed (ICD10: J44.9).     Ipratropium-Albuterol 20-100 MCG/ACT Aers respimat  Commonly known as:  COMBIVENT RESPIMAT  INHALE 1 PUFF BY MOUTH  EVERY 6 HOURS.     lidocaine-prilocaine cream  Commonly known as:  EMLA  Apply 1 application topically as needed.     lisinopril 20 MG tablet  Commonly known as:  PRINIVIL,ZESTRIL  Take 1 tablet (20 mg total) by mouth daily.     multivitamin with minerals Tabs tablet  Take 1 tablet by mouth daily.     ondansetron 8 MG tablet  Commonly known as:  ZOFRAN  Take 1 tablet (8 mg total) by mouth every 8 (eight) hours as needed for nausea or vomiting.     oxyCODONE 5 MG immediate release tablet  Commonly known as:  Oxy IR/ROXICODONE  Take 1 tablet (5 mg total) by mouth every 4 (four) hours as needed for severe pain.     pantoprazole 40 MG tablet  Commonly known as:   PROTONIX  Take 1 tablet (40 mg total) by mouth daily.     PRESCRIPTION MEDICATION  She receives chemo treatments at the [facility] with Dr. [name]. She received Carboplatin 355m and Etoposide 1372mon 04/20/14 and Etoposide 1303mn 04/21/14.     rizatriptan 5 MG disintegrating tablet  Commonly known as:  MAXALT-MLT  Take 5 mg by mouth as needed for migraine. May repeat in 2 hours if needed     simvastatin 20 MG tablet  Commonly known as:  ZOCOR  Take 20 mg by mouth daily.     valACYclovir 1000 MG tablet  Commonly known as:  VALTREX  Take 1 tablet (1,000 mg total) by mouth 3 (three) times daily.         PHYSICAL EXAMINATION  Oncology Vitals 06/24/2014 06/24/2014 06/24/2014 06/24/2014 06/24/2014 06/24/2014 06/24/2014  Height - - - - - - -  Weight - - - - - - -  Weight (lbs) - - - - - - -  BMI (kg/m2) - - - - - - -  Temp - - - - - - -  Pulse 113 - 117 119 118 - 116  Resp 21 - _0 - 23  SpO2 100 95 95 99 96 99 98  BSA (m2) - - - - - - -   BP Readings from Last 3 Encounters:  06/24/14 121/67  06/19/14 119/65  06/17/14 137/74    Physical Exam  Constitutional: She is oriented to person, place, and time.  Patient appears fatigued, weak, and chronically ill.  HENT:  Head: Normocephalic and atraumatic.  Eyes: Conjunctivae and EOM are normal. Pupils are equal, round, and reactive to light. Right eye exhibits no discharge. Left eye exhibits no discharge. No scleral icterus.  Neck: Normal range of motion.  Pulmonary/Chest: Effort normal. No respiratory distress.  On O2 via nasal cannula as baseline.  Neurological: She is alert and oriented to person, place, and time.  Psychiatric: Affect normal.  Nursing note and vitals reviewed.   LABORATORY DATA:. Admission on 06/23/2014  Component Date Value Ref Range Status  . Sodium 06/23/2014 136  135 - 145 mmol/L Final  . Potassium 06/23/2014 4.4  3.5 - 5.1 mmol/L Final  . Chloride 06/23/2014 91* 96 - 112 mmol/L  Final  . CO2 06/23/2014 34* 19 - 32 mmol/L Final  . Glucose, Bld 06/23/2014 180* 70 - 99 mg/dL Final  . BUN 06/23/2014 23  6 - 23 mg/dL Final  . Creatinine, Ser 06/23/2014 0.36* 0.50 - 1.10 mg/dL Final  . Calcium 06/23/2014 8.8  8.4 - 10.5 mg/dL Final  . Total Protein 06/23/2014 6.3  6.0 -  8.3 g/dL Final  . Albumin 06/23/2014 3.5  3.5 - 5.2 g/dL Final  . AST 06/23/2014 29  0 - 37 U/L Final  . ALT 06/23/2014 51* 0 - 35 U/L Final  . Alkaline Phosphatase 06/23/2014 149* 39 - 117 U/L Final  . Total Bilirubin 06/23/2014 0.5  0.3 - 1.2 mg/dL Final  . GFR calc non Af Amer 06/23/2014 >90  >90 mL/min Final  . GFR calc Af Amer 06/23/2014 >90  >90 mL/min Final   Comment: (NOTE) The eGFR has been calculated using the CKD EPI equation. This calculation has not been validated in all clinical situations. eGFR's persistently <90 mL/min signify possible Chronic Kidney Disease.   . Anion gap 06/23/2014 11  5 - 15 Final  . WBC 06/23/2014 7.1  4.0 - 10.5 K/uL Final  . RBC 06/23/2014 3.52* 3.87 - 5.11 MIL/uL Final  . Hemoglobin 06/23/2014 11.4* 12.0 - 15.0 g/dL Final  . HCT 06/23/2014 35.6* 36.0 - 46.0 % Final  . MCV 06/23/2014 101.1* 78.0 - 100.0 fL Final  . MCH 06/23/2014 32.4  26.0 - 34.0 pg Final  . MCHC 06/23/2014 32.0  30.0 - 36.0 g/dL Final  . RDW 06/23/2014 19.8* 11.5 - 15.5 % Final  . Platelets 06/23/2014 176  150 - 400 K/uL Final  . Neutrophils Relative % 06/23/2014 59  43 - 77 % Final  . Lymphocytes Relative 06/23/2014 38  12 - 46 % Final  . Monocytes Relative 06/23/2014 2* 3 - 12 % Final  . Eosinophils Relative 06/23/2014 1  0 - 5 % Final  . Basophils Relative 06/23/2014 0  0 - 1 % Final  . Band Neutrophils 06/23/2014 0  0 - 10 % Final  . Metamyelocytes Relative 06/23/2014 0   Final  . Myelocytes 06/23/2014 0   Final  . Promyelocytes Absolute 06/23/2014 0   Final  . Blasts 06/23/2014 0   Final  . nRBC 06/23/2014 2* 0 /100 WBC Final  . Neutro Abs 06/23/2014 4.2  1.7 - 7.7 K/uL Final    . Lymphs Abs 06/23/2014 2.7  0.7 - 4.0 K/uL Final  . Monocytes Absolute 06/23/2014 0.1  0.1 - 1.0 K/uL Final  . Eosinophils Absolute 06/23/2014 0.1  0.0 - 0.7 K/uL Final  . Basophils Absolute 06/23/2014 0.0  0.0 - 0.1 K/uL Final  . RBC Morphology 06/23/2014 POLYCHROMASIA PRESENT   Final   RARE NRBCs  . WBC Morphology 06/23/2014 INCREASED BANDS (>20% BANDS)   Final   MILD LEFT SHIFT (1-5% METAS, OCC MYELO, OCC BANDS)  . Color, Urine 06/24/2014 YELLOW  YELLOW Final  . APPearance 06/24/2014 CLOUDY* CLEAR Final  . Specific Gravity, Urine 06/24/2014 1.019  1.005 - 1.030 Final  . pH 06/24/2014 6.0  5.0 - 8.0 Final  . Glucose, UA 06/24/2014 250* NEGATIVE mg/dL Final  . Hgb urine dipstick 06/24/2014 MODERATE* NEGATIVE Final  . Bilirubin Urine 06/24/2014 NEGATIVE  NEGATIVE Final  . Ketones, ur 06/24/2014 NEGATIVE  NEGATIVE mg/dL Final  . Protein, ur 06/24/2014 NEGATIVE  NEGATIVE mg/dL Final  . Urobilinogen, UA 06/24/2014 0.2  0.0 - 1.0 mg/dL Final  . Nitrite 06/24/2014 NEGATIVE  NEGATIVE Final  . Leukocytes, UA 06/24/2014 SMALL* NEGATIVE Final  . Lactic Acid, Venous 06/24/2014 1.70  0.5 - 2.0 mmol/L Final  . Lactic Acid, Venous 06/24/2014 0.96  0.5 - 2.0 mmol/L Final  . Strep Pneumo Urinary Antigen 06/24/2014 NEGATIVE  NEGATIVE Final   Comment:        Infection due to S.  pneumoniae cannot be absolutely ruled out since the antigen present may be below the detection limit of the test.   . Sodium 06/24/2014 136  135 - 145 mmol/L Final  . Potassium 06/24/2014 4.3  3.5 - 5.1 mmol/L Final  . Chloride 06/24/2014 96  96 - 112 mmol/L Final  . CO2 06/24/2014 34* 19 - 32 mmol/L Final  . Glucose, Bld 06/24/2014 255* 70 - 99 mg/dL Final  . BUN 06/24/2014 21  6 - 23 mg/dL Final  . Creatinine, Ser 06/24/2014 0.35* 0.50 - 1.10 mg/dL Final  . Calcium 06/24/2014 8.0* 8.4 - 10.5 mg/dL Final  . Total Protein 06/24/2014 5.3* 6.0 - 8.3 g/dL Final  . Albumin 06/24/2014 2.9* 3.5 - 5.2 g/dL Final  . AST  06/24/2014 22  0 - 37 U/L Final  . ALT 06/24/2014 46* 0 - 35 U/L Final  . Alkaline Phosphatase 06/24/2014 133* 39 - 117 U/L Final  . Total Bilirubin 06/24/2014 0.2* 0.3 - 1.2 mg/dL Final  . GFR calc non Af Amer 06/24/2014 >90  >90 mL/min Final  . GFR calc Af Amer 06/24/2014 >90  >90 mL/min Final   Comment: (NOTE) The eGFR has been calculated using the CKD EPI equation. This calculation has not been validated in all clinical situations. eGFR's persistently <90 mL/min signify possible Chronic Kidney Disease.   . Anion gap 06/24/2014 6  5 - 15 Final  . WBC 06/24/2014 3.2* 4.0 - 10.5 K/uL Final  . RBC 06/24/2014 3.13* 3.87 - 5.11 MIL/uL Final  . Hemoglobin 06/24/2014 9.9* 12.0 - 15.0 g/dL Final  . HCT 06/24/2014 31.4* 36.0 - 46.0 % Final  . MCV 06/24/2014 100.3* 78.0 - 100.0 fL Final  . MCH 06/24/2014 31.6  26.0 - 34.0 pg Final  . MCHC 06/24/2014 31.5  30.0 - 36.0 g/dL Final  . RDW 06/24/2014 19.6* 11.5 - 15.5 % Final  . Platelets 06/24/2014 147* 150 - 400 K/uL Final  . Neutrophils Relative % 06/24/2014 53  43 - 77 % Final  . Lymphocytes Relative 06/24/2014 44  12 - 46 % Final  . Monocytes Relative 06/24/2014 2* 3 - 12 % Final  . Eosinophils Relative 06/24/2014 1  0 - 5 % Final  . Basophils Relative 06/24/2014 0  0 - 1 % Final  . Neutro Abs 06/24/2014 1.7  1.7 - 7.7 K/uL Final  . Lymphs Abs 06/24/2014 1.4  0.7 - 4.0 K/uL Final  . Monocytes Absolute 06/24/2014 0.1  0.1 - 1.0 K/uL Final  . Eosinophils Absolute 06/24/2014 0.0  0.0 - 0.7 K/uL Final  . Basophils Absolute 06/24/2014 0.0  0.0 - 0.1 K/uL Final  . RBC Morphology 06/24/2014 STOMATOCYTES   Final  . WBC Morphology 06/24/2014 MILD LEFT SHIFT (1-5% METAS, OCC MYELO, OCC BANDS)   Final  . pH, Arterial 06/24/2014 7.220* 7.350 - 7.450 Final  . pCO2 arterial 06/24/2014 81.7* 35.0 - 45.0 mmHg Final  . pO2, Arterial 06/24/2014 122.0* 80.0 - 100.0 mmHg Final  . Bicarbonate 06/24/2014 33.5* 20.0 - 24.0 mEq/L Final  . TCO2 06/24/2014 36   0 - 100 mmol/L Final  . O2 Saturation 06/24/2014 98.0   Final  . Acid-Base Excess 06/24/2014 4.0* 0.0 - 2.0 mmol/L Final  . Sample type 06/24/2014 ARTERIAL   Final  . Comment 06/24/2014 NOTIFIED PHYSICIAN   Final  . Squamous Epithelial / LPF 06/24/2014 FEW* RARE Final  . WBC, UA 06/24/2014 3-6  <3 WBC/hpf Final  . RBC / HPF 06/24/2014 3-6  <3  RBC/hpf Final  . Bacteria, UA 06/24/2014 MANY* RARE Final  . pH, Arterial 06/24/2014 7.273* 7.350 - 7.450 Final  . pCO2 arterial 06/24/2014 74.7* 35.0 - 45.0 mmHg Final  . pO2, Arterial 06/24/2014 104.0* 80.0 - 100.0 mmHg Final  . Bicarbonate 06/24/2014 34.5* 20.0 - 24.0 mEq/L Final  . TCO2 06/24/2014 37  0 - 100 mmol/L Final  . O2 Saturation 06/24/2014 97.0   Final  . Acid-Base Excess 06/24/2014 6.0* 0.0 - 2.0 mmol/L Final  . Sample type 06/24/2014 ARTERIAL   Final  . Comment 06/24/2014 NOTIFIED PHYSICIAN   Final  Appointment on 06/22/2014  Component Date Value Ref Range Status  . WBC 06/22/2014 26.8* 3.9 - 10.3 10e3/uL Final  . NEUT# 06/22/2014 24.9* 1.5 - 6.5 10e3/uL Final  . HGB 06/22/2014 10.1* 11.6 - 15.9 g/dL Final  . HCT 06/22/2014 32.1* 34.8 - 46.6 % Final  . Platelets 06/22/2014 226  145 - 400 10e3/uL Final  . MCV 06/22/2014 99.5  79.5 - 101.0 fL Final  . MCH 06/22/2014 31.3  25.1 - 34.0 pg Final  . MCHC 06/22/2014 31.5  31.5 - 36.0 g/dL Final  . RBC 06/22/2014 3.23* 3.70 - 5.45 10e6/uL Final  . RDW 06/22/2014 20.5* 11.2 - 14.5 % Final  . lymph# 06/22/2014 0.9  0.9 - 3.3 10e3/uL Final  . MONO# 06/22/2014 1.0* 0.1 - 0.9 10e3/uL Final  . Eosinophils Absolute 06/22/2014 0.0  0.0 - 0.5 10e3/uL Final  . Basophils Absolute 06/22/2014 0.0  0.0 - 0.1 10e3/uL Final  . NEUT% 06/22/2014 92.6* 38.4 - 76.8 % Final  . LYMPH% 06/22/2014 3.4* 14.0 - 49.7 % Final  . MONO% 06/22/2014 3.8  0.0 - 14.0 % Final  . EOS% 06/22/2014 0.1  0.0 - 7.0 % Final  . BASO% 06/22/2014 0.1  0.0 - 2.0 % Final  . Sodium 06/22/2014 141  136 - 145 mEq/L Final  .  Potassium 06/22/2014 4.3  3.5 - 5.1 mEq/L Final  . Chloride 06/22/2014 97* 98 - 109 mEq/L Final  . CO2 06/22/2014 34* 22 - 29 mEq/L Final  . Glucose 06/22/2014 137  70 - 140 mg/dl Final  . BUN 06/22/2014 19.6  7.0 - 26.0 mg/dL Final  . Creatinine 06/22/2014 0.5* 0.6 - 1.1 mg/dL Final  . Total Bilirubin 06/22/2014 0.30  0.20 - 1.20 mg/dL Final  . Alkaline Phosphatase 06/22/2014 219* 40 - 150 U/L Final  . AST 06/22/2014 17  5 - 34 U/L Final  . ALT 06/22/2014 47  0 - 55 U/L Final  . Total Protein 06/22/2014 6.1* 6.4 - 8.3 g/dL Final  . Albumin 06/22/2014 3.4* 3.5 - 5.0 g/dL Final  . Calcium 06/22/2014 8.6  8.4 - 10.4 mg/dL Final  . Anion Gap 06/22/2014 11  3 - 11 mEq/L Final  . EGFR 06/22/2014 >90  >90 ml/min/1.73 m2 Final   eGFR is calculated using the CKD-EPI Creatinine Equation (2009)     RADIOGRAPHIC STUDIES: Ct Abdomen Pelvis W Contrast  06/24/2014   CLINICAL DATA:  Metastatic lung cancer. New onset of abdominal distention. Increasing shortness of breath.  EXAM: CT ABDOMEN AND PELVIS WITH CONTRAST  TECHNIQUE: Multidetector CT imaging of the abdomen and pelvis was performed using the standard protocol following bolus administration of intravenous contrast.  CONTRAST:  80 cc Omnipaque 300  COMPARISON:  Multiple exams, including 04/30/2014  FINDINGS: Lower chest: Consolidation of much of the left lower lobe and parts of the posterior basal segment right lower lobe concerning for pneumonia or pulmonary  hemorrhage. Severe emphysema. Mild distal esophageal wall thickening.  Hepatobiliary: Hypodense metastatic lesions along the falciform ligament and inferiorly in the right hepatic lobe. Index lesion along the falciform ligament measures approximately 2.5 by 1.8 cm. No new liver lesions are identified. The gallbladder is mildly distended. No biliary dilatation observed.  Pancreas: Unremarkable  Spleen: Unremarkable  Adrenals/Urinary Tract: Unremarkable  Stomach/Bowel: There is prominence of stool in  the proximal half of the colon but not the distal half. Sigmoid colon diverticulosis. Appendix unremarkable.  Vascular/Lymphatic: Aortoiliac atherosclerotic vascular disease.  Reproductive: Unremarkable  Other: No supplemental non-categorized findings.  Musculoskeletal: Dextroconvex rotary scoliosis. The patient has known osseous metastatic disease for example in the left femur greater trochanter. Bony demineralization is present.  IMPRESSION: 1. Consolidation and much of the left lower lobe and in the posterior basal segment right lower lobe, favoring pneumonia or pulmonary hemorrhage. 2. Hepatic masses compatible with hepatic metastatic disease is, reduced in size over the past 2 months. 3. The patient has known osseous metastatic disease to the left proximal femur. 4. Mild prominence of stool in the proximal half of the colon without obstruction identified. This could reflect low grade constipation. 5. Severe emphysema. 6. Mild distal esophageal wall thickening -esophagitis not excluded. 7.  Aortoiliac atherosclerotic vascular disease.   Electronically Signed   By: Van Clines M.D.   On: 06/24/2014 07:37   Dg Chest Port 1 View  06/24/2014   CLINICAL DATA:  Acute onset of shortness of breath. Initial encounter.  EXAM: PORTABLE CHEST - 1 VIEW  COMPARISON:  PET/CT performed 04/30/2014, and CT of the chest performed 04/03/2014  FINDINGS: The lungs are well-aerated. Milder left basilar airspace opacity raises concern for pneumonia. There is no evidence of pleural effusion or pneumothorax.  Known metastatic disease is not well characterized on radiograph. The cardiomediastinal silhouette is within normal limits. No acute osseous abnormalities are seen. A right-sided chest port is noted ending about the distal SVC.  IMPRESSION: Mild left basilar airspace opacity raises concern for pneumonia.   Electronically Signed   By: Garald Balding M.D.   On: 06/24/2014 00:57    ASSESSMENT/PLAN:    Small cell lung  carcinoma Patient received cycle 3 of her carboplatin/etoposide chemotherapy regimen on 06/15/2014.  She is scheduled for her next cycle of chemotherapy on 06/29/2014.  Patient is scheduled for restaging CT with contrast of the chest/abdomen/pelvis; as well as a brain MRI on 06/26/2014.  Patient has plans to return on 06/29/2014 for labs, follow up visit, and her next cycle of chemotherapy.   Pain in the abdomen Patient is complaining of some mild, vague lower abdominal discomfort for the past several days.  She feels she may very well be constipated.  Patient was encouraged to use her stool softeners and Maalox as previously directed.   Weakness Patient is complaining of progressive fatigue and weakness recently.  She denies any recent fevers or chills.  Most likely, increasing fatigue and weakness is secondary to chemotherapy side effect.   Cancer associated pain Patient has a history of chronic pain; and has known metastasis.  Patient has been prescribed oxycodone for her chronic pain issues; but family report that patient only takes the pain medication intermittently.  Patient was advised to take the oxycodone as prescribed on an as-needed basis.   Edema Patient and family complaint of increased facial edema, generalized edema, and weight gain within the past few weeks.  Most likely, increased weight gain and edema is secondary to chronic dexamethasone use  for previously diagnosed brain metastasis.  Patient was advised that she could decrease the dexamethasone down to 2 mg twice a day to see if that helps.   Patient stated understanding of all instructions; and was in agreement with this plan of care. The patient knows to call the clinic with any problems, questions or concerns.   This was a shared visit with Dr. [name] today.  Total time spent with patient was 25 minutes;  with greater than 75 percent of that time spent in face to face counseling regarding patient's symptoms,   and coordination of care and follow up.  Disclaimer: This note was dictated with voice recognition software. Similar sounding words can inadvertently be transcribed and may not be corrected upon review.   Drue Second, NP 06/24/2014   ADDENDUM:  Hematology/Oncology Attending:  I had a face to face encounter with the patient. I recommended his care plan. This is a very pleasant 64 years old white female with extensive stage small cell lung cancer who is currently undergoing systemic chemotherapy with carboplatin and etoposide status post 3 cycles. The patient is tolerating her treatment well except for increasing fatigue and weakness. Her son is concerned about her confusion at time as well as the swelling in her abdomen which on exam was consistent with fatty tissue secondary to her treatment with steroids for the last few weeks. The patient herself feels fine today but there are a lot of concern from her family about her mental status and inability to take care of herself. She also has occasional abdominal pain but her son was not given her medication because of concern about her confusion. I recommended for the patient to proceed with the restaging CT scan of the chest, abdomen and pelvis as well as MRI of the brain as a scheduled and few days. She would come back for follow-up visit at that time for reevaluation and discussion of her treatment options based on the scan results. I may consider referring her to radiation oncology after cycle #3 for consideration of whole brain irradiation.   Disclaimer: This note was dictated with voice recognition software. Similar sounding words can inadvertently be transcribed and may be missed upon review. Eilleen Kempf., MD 06/27/2014

## 2014-06-24 NOTE — Care Management Note (Signed)
    Page 1 of 1   06/24/2014     2:20:05 PM CARE MANAGEMENT NOTE 06/24/2014  Patient:  Alexa Stephens, Alexa Stephens   Account Number:  [id number]  Date Initiated:  06/24/2014  Documentation initiated by:  Elissa Hefty  Subjective/Objective Assessment:   adm w resp failure     Action/Plan:   lives w fam, pcp dr Hilliard Clark [place]   Anticipated DC Date:     Anticipated DC Plan:  [place] offered to / List presented to:             Status of service:   Medicare Important Message given?   (If response is "NO", the following Medicare IM given date fields will be blank) Date Medicare IM given:   Medicare IM given by:   Date Additional Medicare IM given:   Additional Medicare IM given by:    Discharge Disposition:    Per UR Regulation:  Reviewed for med. necessity/level of care/duration of stay  If discussed at [place] of Stay Meetings, dates discussed:    Comments:

## 2014-06-24 NOTE — Consult Note (Signed)
Name: Alexa Stephens MRN: [id number] DOB: 1950/09/01    ADMISSION DATE:  06/23/2014 CONSULTATION DATE:  06/24/2014  REFERRING MD :  Ernestina Patches  CHIEF COMPLAINT:  SOB, cough  BRIEF PATIENT DESCRIPTION: 64 y.o. F with stage IV SCLC brought to [facility] ED 4/27 with HCAP.  PCCM consulted as son expressed interest in intubation.  Pt had previously verbalized wishes for DNR / DNI.  After extensive discussion with pt and son, they are in agreement to continue with DNR / DNI status.  SIGNIFICANT EVENTS  4/27 - admit  STUDIES:  CXR 4/27 >>> mild left basilar airspace opacity raises concern for PNA.   HISTORY OF PRESENT ILLNESS:  Alexa Stephens is a 80 y.o. 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 Halford Chessman as outpatient).  She was brought to [facility] ED early AM hours 4/27 with worsening cough for past few days and progressive SOB.  Denied any fevers / chills / sweats, chest pain, N/V/D, myalgias.  Cough has been dry and she has also had abdominal pain and distention. She is currently receiving chemo and is followed by Dr. [name] for her cancer.  In ED, she was hypoxic so was placed on NRB.  CXR revealed mild left basilar opacity concerning for PNA and ABG revealed hypercarbic respiratory failure.  Pt was DNR / DNI in the past; however, in conversation with admitting team, son stated he would consider intubation so PCCM was consulted.  Extensive discussion held with pt, son, and pt's son's girlfriend at the bedside.  They understand the extent of her malignancy and after much thought, they agree that it would be in her best interest to continue with DNR / DNI per her prior wishes.  They would like to attempt NIMV if needed; however, are in agreement that intubation will not be pursued as pt has previously stated that she would never want to rely on machines to keep her alive.  PAST MEDICAL HISTORY :   has a past medical history of Anxiety disorder; Asthma;  COPD (chronic obstructive pulmonary disease); Headache(784.0); Hyperlipemia; Hypertension; Pneumonia (Sept 2013); Sepsis (Sept 2013); and Cancer.  has past surgical history that includes Cosmetic surgery; Cystectomy; and Hand surgery. Prior to Admission medications   Medication Sig Start Date End Date Taking? Authorizing Provider  albuterol (PROAIR HFA) 108 (90 BASE) MCG/ACT inhaler Inhale 2 puffs into the lungs every 6 (six) hours as needed for wheezing or shortness of breath. 05/11/14  Yes Laurie Panda, NP  albuterol (PROVENTIL) (2.5 MG/3ML) 0.083% nebulizer solution Inhale 3 mLs into the lungs every 6 (six) hours as needed for wheezing or shortness of breath.  01/07/14  Yes Historical Provider, MD  dexamethasone (DECADRON) 4 MG tablet Take 1 tablet (4 mg total) by mouth 2 (two) times daily. 06/17/14  Yes Curt Bears, MD  ipratropium (ATROVENT) 0.02 % nebulizer solution Take 2.5 mLs (500 mcg total) by nebulization every 6 (six) hours as needed (ICD10: J44.9). 12/23/13  Yes Chesley Mires, MD  Ipratropium-Albuterol (COMBIVENT RESPIMAT) 20-100 MCG/ACT AERS respimat INHALE 1 PUFF BY MOUTH EVERY 6 HOURS. 05/11/14  Yes Laurie Panda, NP  lisinopril (PRINIVIL,ZESTRIL) 20 MG tablet Take 1 tablet (20 mg total) by mouth daily. 02/03/14  Yes Sean Hommel, DO  pantoprazole (PROTONIX) 40 MG tablet Take 1 tablet (40 mg total) by mouth daily. 04/30/14  Yes Curt Bears, MD  PRESCRIPTION MEDICATION She receives chemo treatments at the [facility]  [place] with Dr. [name]. She received Carboplatin '310mg'$  and Etoposide '130mg'$  on 04/20/14 and Etoposide '130mg'$  on 04/21/14.   Yes Historical Provider, MD  simvastatin (ZOCOR) 20 MG tablet Take 20 mg by mouth daily.   Yes Historical Provider, MD  clonazePAM (KLONOPIN) 1 MG tablet TAKE 1 TABLET BY MOUTH TWICE DAILY AS NEEDED FOR ANXIETY Patient not taking: Reported on 06/24/2014 02/23/14   [name], DO  fluconazole (DIFLUCAN) 100 MG tablet Take 1 tablet (100 mg  total) by mouth daily. Patient not taking: Reported on 06/24/2014 05/13/14   Curt Bears, MD  Fluticasone Furoate-Vilanterol (BREO ELLIPTA) 100-25 MCG/INH AEPB Inhale 1 puff into the lungs daily. Patient not taking: Reported on 06/24/2014 04/21/14   Tammy S Parrett, NP  HYDROcodone-homatropine (HYCODAN) 5-1.5 MG/5ML syrup Take 5 mLs by mouth every 6 (six) hours as needed for cough. Patient not taking: Reported on 06/24/2014 06/08/14   Curt Bears, MD  lidocaine-prilocaine (EMLA) cream Apply 1 application topically as needed. Patient not taking: Reported on 06/24/2014 05/11/14   Laurie Panda, NP  ondansetron (ZOFRAN) 8 MG tablet Take 1 tablet (8 mg total) by mouth every 8 (eight) hours as needed for nausea or vomiting. Patient not taking: Reported on 06/24/2014 04/21/14   Curt Bears, MD  oxyCODONE (OXY IR/ROXICODONE) 5 MG immediate release tablet Take 1 tablet (5 mg total) by mouth every 4 (four) hours as needed for severe pain. Patient not taking: Reported on 06/24/2014 05/06/14   Susanne Borders, NP  valACYclovir (VALTREX) 1000 MG tablet Take 1 tablet (1,000 mg total) by mouth 3 (three) times daily. Patient not taking: Reported on 06/24/2014 06/08/14   Curt Bears, MD   Allergies  Allergen Reactions  . Benadryl [Diphenhydramine]     hyperactive  . Compazine [Prochlorperazine Edisylate] Other (See Comments)    Didn't work for her  . Excedrin Extra Strength [Aspirin-Acetaminophen-Caffeine] Other (See Comments)    "kidney aches"  . Flexeril [Cyclobenzaprine] Other (See Comments)    SKIN NUMB  . Lexapro [Escitalopram] Other (See Comments)    Gi upset  . Naproxen Other (See Comments)    High BP  . Skelaxin [Metaxalone] Other (See Comments)    DIDN'T WORK  . Tylenol [Acetaminophen] Other (See Comments)    Difficulty breathing    FAMILY HISTORY:  family history includes Cancer in her maternal grandmother; Lung cancer in her maternal grandfather. SOCIAL HISTORY:  reports that  she quit smoking about 5 months ago. Her smoking use included Cigarettes. She has a .8 pack-year smoking history. She does not have any smokeless tobacco history on file. She reports that she does not drink alcohol or use illicit drugs.  REVIEW OF SYSTEMS:   All negative; except for those that are bolded, which indicate positives.  Constitutional: weight loss, weight gain, night sweats, fevers, chills, fatigue, weakness.  HEENT: headaches, sore throat, sneezing, nasal congestion, post nasal drip, difficulty swallowing, tooth/dental problems, visual complaints, visual changes, ear aches. Neuro: difficulty with speech, weakness, numbness, ataxia. CV:  chest pain, orthopnea, PND, swelling in lower extremities, dizziness, palpitations, syncope.  Resp: cough, hemoptysis, dyspnea, wheezing. GI  heartburn, indigestion, abdominal pain, nausea, vomiting, diarrhea, constipation, change in bowel habits, loss of appetite, hematemesis, melena, hematochezia.  GU: dysuria, change in color of urine, urgency or frequency, flank pain, hematuria. MSK: joint pain or swelling, decreased range of motion. Psych: change in mood or affect, depression, anxiety, suicidal ideations, homicidal ideations. Skin: rash, itching, bruising.   SUBJECTIVE:  Breathing is somewhat  improved since arriving in ED.  VITAL SIGNS: Temp:  [98.4 F (36.9 C)] 98.4 F (36.9 C) (04/26 2316) Pulse Rate:  [115-141] 118 (04/27 0430) Resp:  [20-30] 24 (04/27 0430) BP: (129-166)/(60-117) 152/84 mmHg (04/27 0430) SpO2:  [83 %-100 %] 100 % (04/27 0430) FiO2 (%):  [0 %] 0 % (04/27 0201) Weight:  [52.164 kg (115 lb)] 52.164 kg (115 lb) (04/26 2316)  PHYSICAL EXAMINATION: General: Chronically ill appearing female, resting in bed, in NAD. Neuro: Somnolent but arouses to voice.  A&O x 3, no focal deficits. HEENT: [place]/AT. PERRL, sclerae anicteric. Cardiovascular: RRR, no M/R/G.  Lungs: Respirations shallow and mildly labored.  Coarse rhonchi L  > R. Abdomen: BS x 4, soft, mild epigastric tenderness. Musculoskeletal: No gross deformities, no edema.  Skin: Intact, warm, no rashes.    Recent Labs Lab 06/22/14 1608 06/23/14 2353 06/24/14 0245  NA 141 136 136  K 4.3 4.4 4.3  CL  --  91* 96  CO2 34* 34* 34*  BUN 19.'6 23 21  '$ CREATININE 0.5* 0.36* 0.35*  GLUCOSE 137 180* 255*    Recent Labs Lab 06/22/14 1608 06/23/14 2353 06/24/14 0245  HGB 10.1* 11.4* 9.9*  HCT 32.1* 35.6* 31.4*  WBC 26.8* 7.1 3.2*  PLT 226 176 147*   Dg Chest Port 1 View  06/24/2014   CLINICAL DATA:  Acute onset of shortness of breath. Initial encounter.  EXAM: PORTABLE CHEST - 1 VIEW  COMPARISON:  PET/CT performed 04/30/2014, and CT of the chest performed 04/03/2014  FINDINGS: The lungs are well-aerated. Milder left basilar airspace opacity raises concern for pneumonia. There is no evidence of pleural effusion or pneumothorax.  Known metastatic disease is not well characterized on radiograph. The cardiomediastinal silhouette is within normal limits. No acute osseous abnormalities are seen. A right-sided chest port is noted ending about the distal SVC.  IMPRESSION: Mild left basilar airspace opacity raises concern for pneumonia.   Electronically Signed   By: Garald Balding M.D.   On: 06/24/2014 00:57    ASSESSMENT / PLAN:  Acute on chronic hypoxemic and hypercarbic respiratory failure HCAP COPD without evidence of exacerbation Stage IV SCLC Plan: Continue supplemental O2 as needed to maintain SpO2 > 90%. BiPAP PRN. Change abx from Ceftriaxone / Azithromycin to Vanc / Zosyn to cover for HCAP. Follow cultures. Continue BD's. Primary team to consult oncology. Palliative care has seen in the past.  Would get them involved re home health / hospice needs. Follow CXR.  Extensive discussion held with pt, son, and pt's son's girlfriend at the bedside.  They understand the extent of her malignancy and after much thought, they agree that it would be in her  best interest to continue with DNR / DNI per her prior wishes.  They would like to attempt NIMV if needed; however, are in agreement that intubation will not be pursued as pt has previously stated that she would never want to rely on machines to keep her alive.  Admission orders confirmed as DNR / DNI status.   PCCM will sign off.  Please do not hesitate to call us back if we can be of any further assistance.   Montey Hora, [facility] Pager: [phone]  or [phone] 06/24/2014, 5:00 AM

## 2014-06-24 NOTE — Assessment & Plan Note (Signed)
Patient has a history of chronic pain; and has known metastasis.  Patient has been prescribed oxycodone for her chronic pain issues; but family report that patient only takes the pain medication intermittently.  Patient was advised to take the oxycodone as prescribed on an as-needed basis.

## 2014-06-24 NOTE — Telephone Encounter (Signed)
I received a call from [place] that family member wants to know that pt is in hospital . Note to [facility]

## 2014-06-24 NOTE — Assessment & Plan Note (Signed)
Patient is complaining of some mild, vague lower abdominal discomfort for the past several days.  She feels she may very well be constipated.  Patient was encouraged to use her stool softeners and Maalox as previously directed.

## 2014-06-24 NOTE — H&P (Signed)
Hospitalist Admission History and Physical  Patient name: Alexa Stephens Medical record number: [id number] Date of birth: 1950-04-21 Age: 64 y.o. Gender: female  Primary Care Provider: [name], DO  Chief Complaint: acute on chronic resp failure w/ hypoxia, CAP History of Present Illness:This is a 64 y.o. year old female with significant past medical history of stage 4 small cell lung cancer, chronic resp failure on 3-4L at home, COPD, HTN, anxiety  presenting with acute on chronic resp failure w/ hypoxia, COPD. Pt w/ acute onset of worsening cough over the past days. Progressive SOB. No fevers or chills. No nausea or vomiting. Wears 3-4 L at home chronically. Has been compliant w/ this. Non smoker. Actively receiving chemotherapy for small cell lung cancer. Followed by pulm outpt and [facility].  Pt also w/ worsening abd distension and pain per family.  Presents to the ER T 98.4 heart rate in the 110s to 120s, respirations and 20s, blood pressure in the 120s to 160s. Satting in the mid and 90s with nonrebreather at 57%. White blood cell count 7.1, hemoglobin 11.4, creatinine 0.36. Chest x-ray shows mild left basilar airspace opacity concerning for pneumonia. Started on Rocephin and azithromycin empirically. Assessment and Plan: Alexa Stephens is a 46 y.o. year old female presenting with acute on chronic resp failure w/ hypoxia, CAP Active Problems:   Acute on chronic respiratory failure with hypoxia   Abdominal distension   1- Acute on chronic resp failure w/ hypoxia -likely secondary to CAP in setting of stage 4 small cell lung cancer-? Post obstructive component -IV rocephin and azithromycin -nonrebreather -stat ABG-BiPAP as clinically indicated -PCCM c/s if marked resp acidosis as son willing to consider intubation -blood culture, urine strep and legionella -stepdown bed  2-stage 4 lung cancer -s/p recent chemotherapy cycle w/ neulasta -noted metastatic disease on  recent MRI of the brain -followed by cancer center -oncology consult as clinically indicated  3-Abd distension/Adb pain  -mild -moderate pain on exam w/ noted abd distension -noted baseline metastatic small cell lung Ca -CT abd and pelvis to coorelate  -NPO   4- COPD -no active wheezing on exam -cont prn nebs -follow   5- HTN -BP stable  -cont home regimen   FEN/GI: NPO for now. PPI Prophylaxis: sub q heparin  Disposition: pending further evaluation  Code Status:advance directives noted w/ DNR. However, son desires intubation/aggressive measures at present. Pt noted to be in active resp distress. Will readdress in am.    Patient Active Problem List   Diagnosis Date Noted  . Acute on chronic respiratory failure with hypoxia 06/24/2014  . Shingles 05/06/2014  . Sinusitis 04/22/2014  . 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 Liver masses 03/27/2014  . Rapid weight loss 12/25/2013  . Anxiety and depression 09/02/2013  . Tremor 06/11/2013  . Constipation 06/11/2013  . Anemia 06/11/2013  . Migraine headache 04/09/2012  . HTN (hypertension) 11/22/2011  . HLD (hyperlipidemia) 11/22/2011  . Chronic obstructive pulmonary disease (COPD) 11/08/2011  . COPD exacerbation 11/01/2011  . Chronic respiratory failure with hypoxia 11/01/2011  . Tobacco abuse 11/01/2011  . Health care maintenance 11/01/2011   Past Medical History: Past Medical History  Diagnosis Date  . Anxiety disorder   . Asthma   . COPD (chronic obstructive pulmonary disease)     Not on home O2 yet but is prescribed.  . Headache(784.0)   . Hyperlipemia   . Hypertension   . Pneumonia Sept 2013  . Sepsis Sept 2013  .  Cancer     lung cancer     Past Surgical History: Past Surgical History  Procedure Laterality Date  . Cosmetic surgery    . Cystectomy    . Hand surgery      Social History: History   Social History  . Marital Status: Single    Spouse Name: N/A  . Number of Children: N/A  . Years  of Education: N/A   Occupational History  . retired    Social History Main Topics  . Smoking status: Former Smoker -- 0.02 packs/day for 40 years    Types: Cigarettes    Quit date: 12/28/2013  . Smokeless tobacco: Not on file     Comment: 1 pack per 3-4 days 06/11/13-- QUIT around November 2015  . Alcohol Use: No  . Drug Use: No  . Sexual Activity: Not on file   Other Topics Concern  . None   Social History Narrative    Family History: Family History  Problem Relation Age of Onset  . Lung cancer Maternal Grandfather   . Cancer Maternal Grandmother     Allergies: Allergies  Allergen Reactions  . Benadryl [Diphenhydramine]     hyperactive  . Compazine [Prochlorperazine Edisylate] Other (See Comments)    Didn't work for her  . Excedrin Extra Strength [Aspirin-Acetaminophen-Caffeine] Other (See Comments)    "kidney aches"  . Flexeril [Cyclobenzaprine] Other (See Comments)    SKIN NUMB  . Lexapro [Escitalopram] Other (See Comments)    Gi upset  . Naproxen Other (See Comments)    High BP  . Skelaxin [Metaxalone] Other (See Comments)    DIDN'T WORK  . Tylenol [Acetaminophen] Other (See Comments)    Difficulty breathing    Current Facility-Administered Medications  Medication Dose Route Frequency Provider Last Rate Last Dose  . 0.9 %  sodium chloride infusion  1,000 mL Intravenous Continuous Delora Fuel, MD 009 mL/hr at 06/24/14 0111 1,000 mL at 06/24/14 0111  . albuterol (PROVENTIL HFA;VENTOLIN HFA) 108 (90 BASE) MCG/ACT inhaler 2 puff  2 puff Inhalation Q6H PRN [name], MD      . azithromycin (ZITHROMAX) 500 mg in dextrose 5 % 250 mL IVPB  500 mg Intravenous Once Delora Fuel, MD 381 mL/hr at 06/24/14 0105 500 mg at 06/24/14 0105  . azithromycin (ZITHROMAX) 500 mg in dextrose 5 % 250 mL IVPB  500 mg Intravenous Q24H [name], MD      . cefTRIAXone (ROCEPHIN) 1 g in dextrose 5 % 50 mL IVPB  1 g Intravenous Q24H [name], MD      . dexamethasone  (DECADRON) tablet 4 mg  4 mg Oral BID [name], MD      . heparin injection 5,000 Units  5,000 Units Subcutaneous 3 times per day [name], MD      . ipratropium (ATROVENT) nebulizer solution 500 mcg  500 mcg Nebulization Q6H PRN [name], MD      . Ipratropium-Albuterol (COMBIVENT) respimat 1 puff  1 puff Inhalation Q6H [name], MD      . lisinopril (PRINIVIL,ZESTRIL) tablet 20 mg  20 mg Oral Daily [name], MD      . pantoprazole (PROTONIX) EC tablet 40 mg  40 mg Oral Daily [name], MD      . simvastatin (ZOCOR) tablet 20 mg  20 mg Oral Daily [name], MD       Current Outpatient Prescriptions  Medication Sig Dispense Refill  .  albuterol (PROAIR HFA) 108 (90 BASE) MCG/ACT inhaler Inhale 2 puffs into the lungs every 6 (six) hours as needed for wheezing or shortness of breath. 3 Inhaler 1  . albuterol (PROVENTIL) (2.5 MG/3ML) 0.083% nebulizer solution Inhale 3 mLs into the lungs every 6 (six) hours as needed for wheezing or shortness of breath.   12  . dexamethasone (DECADRON) 4 MG tablet Take 1 tablet (4 mg total) by mouth 2 (two) times daily. 60 tablet 0  . ipratropium (ATROVENT) 0.02 % nebulizer solution Take 2.5 mLs (500 mcg total) by nebulization every 6 (six) hours as needed (ICD10: J44.9). 75 mL 5  . Ipratropium-Albuterol (COMBIVENT RESPIMAT) 20-100 MCG/ACT AERS respimat INHALE 1 PUFF BY MOUTH EVERY 6 HOURS. 4 g 0  . lisinopril (PRINIVIL,ZESTRIL) 20 MG tablet Take 1 tablet (20 mg total) by mouth daily. 90 tablet 3  . pantoprazole (PROTONIX) 40 MG tablet Take 1 tablet (40 mg total) by mouth daily. 30 tablet 1  . PRESCRIPTION MEDICATION She receives chemo treatments at the [facility] with Dr. [name]. She received Carboplatin '310mg'$  and Etoposide '130mg'$  on 04/20/14 and Etoposide '130mg'$  on 04/21/14.    . simvastatin (ZOCOR) 20 MG tablet Take 20 mg by mouth daily.    . clonazePAM (KLONOPIN) 1 MG tablet TAKE 1 TABLET BY MOUTH TWICE  DAILY AS NEEDED FOR ANXIETY (Patient not taking: Reported on 06/24/2014) 60 tablet 0  . fluconazole (DIFLUCAN) 100 MG tablet Take 1 tablet (100 mg total) by mouth daily. (Patient not taking: Reported on 06/24/2014) 7 tablet 0  . Fluticasone Furoate-Vilanterol (BREO ELLIPTA) 100-25 MCG/INH AEPB Inhale 1 puff into the lungs daily. (Patient not taking: Reported on 06/24/2014) 28 each 5  . HYDROcodone-homatropine (HYCODAN) 5-1.5 MG/5ML syrup Take 5 mLs by mouth every 6 (six) hours as needed for cough. (Patient not taking: Reported on 06/24/2014) 120 mL 0  . lidocaine-prilocaine (EMLA) cream Apply 1 application topically as needed. (Patient not taking: Reported on 06/24/2014) 30 g 2  . ondansetron (ZOFRAN) 8 MG tablet Take 1 tablet (8 mg total) by mouth every 8 (eight) hours as needed for nausea or vomiting. (Patient not taking: Reported on 06/24/2014) 20 tablet 0  . oxyCODONE (OXY IR/ROXICODONE) 5 MG immediate release tablet Take 1 tablet (5 mg total) by mouth every 4 (four) hours as needed for severe pain. (Patient not taking: Reported on 06/24/2014) 30 tablet 0  . valACYclovir (VALTREX) 1000 MG tablet Take 1 tablet (1,000 mg total) by mouth 3 (three) times daily. (Patient not taking: Reported on 06/24/2014) 21 tablet 0   Review Of Systems: 12 point ROS negative except as noted above in HPI.  Physical Exam: Filed Vitals:   06/24/14 0100  BP: 163/72  Pulse: 119  Temp:   Resp: 25    General: cachectic and moderate distress HEENT: PERRLA and extra ocular movement intact Heart: S1, S2 normal, no murmur, rub or gallop, regular rate and rhythm Lungs: + labored breathing, unable to speak in full sentences, decreased breath sounds diffusely Abdomen: + abd distension, + bowel sounds Extremities: extremities normal, atraumatic, no cyanosis or edema Skin:no rashes Neurology: normal without focal findings  Labs and Imaging: Lab Results  Component Value Date/Time   NA 136 06/23/2014 11:53 PM   NA 141  06/22/2014 04:08 PM   K 4.4 06/23/2014 11:53 PM   K 4.3 06/22/2014 04:08 PM   CL 91* 06/23/2014 11:53 PM   CO2 34* 06/23/2014 11:53 PM   CO2 34* 06/22/2014 04:08 PM  BUN 23 06/23/2014 11:53 PM   BUN 19.6 06/22/2014 04:08 PM   CREATININE 0.36* 06/23/2014 11:53 PM   CREATININE 0.5* 06/22/2014 04:08 PM   CREATININE 0.45* 04/02/2014 04:42 PM   GLUCOSE 180* 06/23/2014 11:53 PM   GLUCOSE 137 06/22/2014 04:08 PM   Lab Results  Component Value Date   WBC 7.1 06/23/2014   HGB 11.4* 06/23/2014   HCT 35.6* 06/23/2014   MCV 101.1* 06/23/2014   PLT 176 06/23/2014    Dg Chest Port 1 View  06/24/2014   CLINICAL DATA:  Acute onset of shortness of breath. Initial encounter.  EXAM: PORTABLE CHEST - 1 VIEW  COMPARISON:  PET/CT performed 04/30/2014, and CT of the chest performed 04/03/2014  FINDINGS: The lungs are well-aerated. Milder left basilar airspace opacity raises concern for pneumonia. There is no evidence of pleural effusion or pneumothorax.  Known metastatic disease is not well characterized on radiograph. The cardiomediastinal silhouette is within normal limits. No acute osseous abnormalities are seen. A right-sided chest port is noted ending about the distal SVC.  IMPRESSION: Mild left basilar airspace opacity raises concern for pneumonia.   Electronically Signed   By: Garald Balding M.D.   On: 06/24/2014 00:57           Shanda Howells MD  Pager: [phone]

## 2014-06-24 NOTE — Progress Notes (Signed)
[facility] TEAM 1 - Stepdown/ICU TEAM Progress Note  Alexa Stephens LEX:[id number] DOB: 03-May-1950 DOA: 06/23/2014 PCP: [name], DO  Admit HPI / Brief Narrative: 64 y.o. female with history of stage 4 small cell lung cancer, chronic resp failure on 3-4L O2 at home, COPD, HTN, and anxietywho presented with acute on chronic resp failure w/ hypoxia. Pt reported the acute onset of cough over a few days with progressive SOB. Actively receiving chemotherapy for small cell lung cancer. Followed by pulm outpt and [facility].   In the ER T 98.4 heart rate in the 110s to 120s, respirations 20s, blood pressure in the 120s to 160s. Satting in the mid and 90s with nonrebreather. White blood cell count 7.1, hemoglobin 11.4, creatinine 0.36. Chest x-ray noted mild left basilar airspace opacity concerning for pneumonia.   HPI/Subjective: Patient seen for a follow-up visit  Assessment/Plan:  Acute on chronic hypoxemic and hypercarbic respiratory failure  HCAP  COPD without evidence of exacerbation  Stage IV SCLC  Code Status: No CODE BLUE Family Communication: no family present at time of exam Disposition Plan: SDU  Consultants: PCCM  Procedures: None  Antibiotics: Vancomycin 4/27 > Zosyn 4/27 >  DVT prophylaxis: Subcutaneous heparin  Objective: Blood pressure 119/65, pulse 105, temperature 98.4 F (36.9 C), temperature source Oral, resp. rate 24, height '5\' 1"'$  (1.549 m), weight 52.164 kg (115 lb), SpO2 97 %.  Intake/Output Summary (Last 24 hours) at 06/24/14 1409 Last data filed at 06/24/14 0215  Gross per 24 hour  Intake   1900 ml  Output      0 ml  Net   1900 ml   Exam: Follow-up visit completed  Data Reviewed: Basic Metabolic Panel:  Recent Labs Lab 06/22/14 1608 06/23/14 2353 06/24/14 0245  NA 141 136 136  K 4.3 4.4 4.3  CL  --  91* 96  CO2 34* 34* 34*  GLUCOSE 137 180* 255*  BUN 19.'6 23 21  '$ CREATININE 0.5* 0.36* 0.35*  CALCIUM 8.6 8.8 8.0*      Liver Function Tests:  Recent Labs Lab 06/22/14 1608 06/23/14 2353 06/24/14 0245  AST '17 29 22  '$ ALT 47 51* 46*  ALKPHOS 219* 149* 133*  BILITOT 0.30 0.5 0.2*  PROT 6.1* 6.3 5.3*  ALBUMIN 3.4* 3.5 2.9*   CBC:  Recent Labs Lab 06/22/14 1608 06/23/14 2353 06/24/14 0245  WBC 26.8* 7.1 3.2*  NEUTROABS 24.9* 4.2 1.7  HGB 10.1* 11.4* 9.9*  HCT 32.1* 35.6* 31.4*  MCV 99.5 101.1* 100.3*  PLT 226 176 147*   Studies:   Recent x-ray studies have been reviewed in detail by the Attending Physician  Scheduled Meds:  Scheduled Meds: . dexamethasone  4 mg Oral BID  . heparin  5,000 Units Subcutaneous 3 times per day  . ipratropium-albuterol  3 mL Inhalation Q6H  . lisinopril  20 mg Oral Daily  . pantoprazole  40 mg Oral Daily  . piperacillin-tazobactam (ZOSYN)  IV  3.375 g Intravenous Q8H  . simvastatin  20 mg Oral Daily  . vancomycin  500 mg Intravenous Q12H    Time spent on care of this patient: No charge   Cherene Altes , MD   [facility] Office  [phone] Pager - Text Page per Amion as per below:  On-Call/Text Page:      [name]      password [email or web address]  If 7PM-7AM, please contact night-coverage [email or web address] Password [email or web address] 06/24/2014, 2:09 PM   LOS: 0 days

## 2014-06-24 NOTE — Assessment & Plan Note (Signed)
Patient received cycle 3 of her carboplatin/etoposide chemotherapy regimen on 06/15/2014.  She is scheduled for her next cycle of chemotherapy on 06/29/2014.  Patient is scheduled for restaging CT with contrast of the chest/abdomen/pelvis; as well as a brain MRI on 06/26/2014.  Patient has plans to return on 06/29/2014 for labs, follow up visit, and her next cycle of chemotherapy.

## 2014-06-24 NOTE — Assessment & Plan Note (Signed)
Patient and family complaint of increased facial edema, generalized edema, and weight gain within the past few weeks.  Most likely, increased weight gain and edema is secondary to chronic dexamethasone use for previously diagnosed brain metastasis.  Patient was advised that she could decrease the dexamethasone down to 2 mg twice a day to see if that helps.

## 2014-06-24 NOTE — Assessment & Plan Note (Signed)
Patient is complaining of progressive fatigue and weakness recently.  She denies any recent fevers or chills.  Most likely, increasing fatigue and weakness is secondary to chemotherapy side effect.

## 2014-06-24 NOTE — Progress Notes (Signed)
ANTIBIOTIC CONSULT NOTE - INITIAL  Pharmacy Consult for Vancocin and Zosyn Indication: rule out pneumonia  Allergies  Allergen Reactions  . Benadryl [Diphenhydramine]     hyperactive  . Compazine [Prochlorperazine Edisylate] Other (See Comments)    Didn't work for her  . Excedrin Extra Strength [Aspirin-Acetaminophen-Caffeine] Other (See Comments)    "kidney aches"  . Flexeril [Cyclobenzaprine] Other (See Comments)    SKIN NUMB  . Lexapro [Escitalopram] Other (See Comments)    Gi upset  . Naproxen Other (See Comments)    High BP  . Skelaxin [Metaxalone] Other (See Comments)    DIDN'T WORK  . Tylenol [Acetaminophen] Other (See Comments)    Difficulty breathing    Patient Measurements: Height: '5\' 1"'$  (154.9 cm) Weight: 115 lb (52.164 kg) IBW/kg (Calculated) : 47.8  Vital Signs: Temp: 98.4 F (36.9 C) (04/26 2316) Temp Source: Oral (04/26 2316) BP: 151/83 mmHg (04/27 0500) Pulse Rate: 116 (04/27 0500)  Labs:  Recent Labs  06/22/14 1608 06/22/14 1608 06/23/14 2353 06/24/14 0245  WBC 26.8*  --  7.1 3.2*  HGB 10.1*  --  11.4* 9.9*  PLT 226  --  176 147*  CREATININE  --  0.5* 0.36* 0.35*   Estimated Creatinine Clearance: 52.9 mL/min (by C-G formula based on Cr of 0.35).  Medical History: Past Medical History  Diagnosis Date  . Anxiety disorder   . Asthma   . COPD (chronic obstructive pulmonary disease)     Not on home O2 yet but is prescribed.  . Headache(784.0)   . Hyperlipemia   . Hypertension   . Pneumonia Sept 2013  . Sepsis Sept 2013  . Cancer     lung cancer      Assessment: 64yo female actively on chemo for stage IV SCLC presents via EMS for SOB, O2 sats at home as low as 60s on 3-4L per family which is reported as a change from usual 95-100%, CXR concerning for PNA, to begin IV ABX.  Goal of Therapy:  Vancomycin trough level 15-20 mcg/ml  Plan:  Will begin vancomycin '500mg'$  IV Q12H and Zosyn 3.375g IV Q8H and monitor CBC, Cx, levels  prn.  Wynona Neat, PharmD, BCPS  06/24/2014,5:21 AM

## 2014-06-25 ENCOUNTER — Inpatient Hospital Stay (HOSPITAL_COMMUNITY): Payer: Medicare Other

## 2014-06-25 DIAGNOSIS — I10 Essential (primary) hypertension: Secondary | ICD-10-CM | POA: Diagnosis present

## 2014-06-25 DIAGNOSIS — E872 Acidosis: Secondary | ICD-10-CM | POA: Diagnosis present

## 2014-06-25 DIAGNOSIS — R000 Tachycardia, unspecified: Secondary | ICD-10-CM | POA: Diagnosis present

## 2014-06-25 DIAGNOSIS — C7931 Secondary malignant neoplasm of brain: Secondary | ICD-10-CM

## 2014-06-25 DIAGNOSIS — I471 Supraventricular tachycardia: Secondary | ICD-10-CM

## 2014-06-25 DIAGNOSIS — C7951 Secondary malignant neoplasm of bone: Secondary | ICD-10-CM | POA: Diagnosis present

## 2014-06-25 DIAGNOSIS — C787 Secondary malignant neoplasm of liver and intrahepatic bile duct: Secondary | ICD-10-CM | POA: Diagnosis present

## 2014-06-25 DIAGNOSIS — E8729 Other acidosis: Secondary | ICD-10-CM | POA: Diagnosis present

## 2014-06-25 DIAGNOSIS — J189 Pneumonia, unspecified organism: Principal | ICD-10-CM

## 2014-06-25 DIAGNOSIS — C3490 Malignant neoplasm of unspecified part of unspecified bronchus or lung: Secondary | ICD-10-CM | POA: Diagnosis present

## 2014-06-25 DIAGNOSIS — C801 Malignant (primary) neoplasm, unspecified: Secondary | ICD-10-CM

## 2014-06-25 DIAGNOSIS — J9621 Acute and chronic respiratory failure with hypoxia: Secondary | ICD-10-CM

## 2014-06-25 DIAGNOSIS — C799 Secondary malignant neoplasm of unspecified site: Secondary | ICD-10-CM

## 2014-06-25 DIAGNOSIS — J441 Chronic obstructive pulmonary disease with (acute) exacerbation: Secondary | ICD-10-CM

## 2014-06-25 DIAGNOSIS — R140 Abdominal distension (gaseous): Secondary | ICD-10-CM

## 2014-06-25 LAB — COMPREHENSIVE METABOLIC PANEL
ALBUMIN: 2.3 g/dL — AB (ref 3.5–5.2)
ALK PHOS: 83 U/L (ref 39–117)
ALT: 39 U/L — AB (ref 0–35)
ALT: 40 U/L — ABNORMAL HIGH (ref 0–35)
ANION GAP: 7 (ref 5–15)
AST: 21 U/L (ref 0–37)
AST: 19 U/L (ref 0–37)
Albumin: 2.5 g/dL — ABNORMAL LOW (ref 3.5–5.2)
Alkaline Phosphatase: 97 U/L (ref 39–117)
Anion gap: 5 (ref 5–15)
BILIRUBIN TOTAL: 0.4 mg/dL (ref 0.3–1.2)
BUN: 8 mg/dL (ref 6–23)
BUN: 11 mg/dL (ref 6–23)
CALCIUM: 8.5 mg/dL (ref 8.4–10.5)
CHLORIDE: 95 mmol/L — AB (ref 96–112)
CO2: 37 mmol/L — ABNORMAL HIGH (ref 19–32)
CO2: 34 mmol/L — AB (ref 19–32)
CREATININE: 0.5 mg/dL (ref 0.50–1.10)
Calcium: 8.7 mg/dL (ref 8.4–10.5)
Chloride: 96 mmol/L (ref 96–112)
Creatinine, Ser: 0.38 mg/dL — ABNORMAL LOW (ref 0.50–1.10)
GFR calc Af Amer: >90 mL/min (ref >90)
GFR calc Af Amer: >90 mL/min (ref >90)
GLUCOSE: 300 mg/dL — AB (ref 70–99)
Glucose, Bld: 246 mg/dL — ABNORMAL HIGH (ref 70–99)
Potassium: 4.3 mmol/L (ref 3.5–5.1)
Potassium: 4 mmol/L (ref 3.5–5.1)
Sodium: 137 mmol/L (ref 135–145)
Sodium: 137 mmol/L (ref 135–145)
TOTAL PROTEIN: 4.8 g/dL — AB (ref 6.0–8.3)
TOTAL PROTEIN: 5.5 g/dL — AB (ref 6.0–8.3)
Total Bilirubin: 0.3 mg/dL (ref 0.3–1.2)

## 2014-06-25 LAB — POCT I-STAT 3, ART BLOOD GAS (G3+)
Acid-Base Excess: 7 mmol/L — ABNORMAL HIGH (ref 0.0–2.0)
Bicarbonate: 34.2 mEq/L — ABNORMAL HIGH (ref 20.0–24.0)
O2 Saturation: 97 %
PCO2 ART: 57.6 mmHg — AB (ref 35.0–45.0)
PO2 ART: 90 mmHg (ref 80.0–100.0)
Patient temperature: 98
TCO2: 36 mmol/L (ref 0–100)
pH, Arterial: 7.379 (ref 7.350–7.450)

## 2014-06-25 LAB — CBC
HCT: 26 % — ABNORMAL LOW (ref 36.0–46.0)
HEMOGLOBIN: 8.5 g/dL — AB (ref 12.0–15.0)
MCH: 31.6 pg (ref 26.0–34.0)
MCHC: 32.7 g/dL (ref 30.0–36.0)
MCV: 96.7 fL (ref 78.0–100.0)
Platelets: 211 10*3/uL (ref 150–400)
RBC: 2.69 MIL/uL — ABNORMAL LOW (ref 3.87–5.11)
RDW: 19.5 % — ABNORMAL HIGH (ref 11.5–15.5)
WBC: 2.5 10*3/uL — ABNORMAL LOW (ref 4.0–10.5)

## 2014-06-25 LAB — LEGIONELLA ANTIGEN, URINE

## 2014-06-25 MED ORDER — IPRATROPIUM-ALBUTEROL 0.5-2.5 (3) MG/3ML IN SOLN: 3.0000 mL | Freq: Four times a day (QID) | RESPIRATORY_TRACT | Status: DC | PRN

## 2014-06-25 MED ORDER — METOPROLOL TARTRATE 1 MG/ML IV SOLN
2.5000 mg | Freq: Four times a day (QID) | INTRAVENOUS | Status: DC
  Administered 2014-06-25 – 2014-06-26 (×3): 2.5 mg via INTRAVENOUS
  Filled 2014-06-25 (×7): qty 5

## 2014-06-25 MED ORDER — IPRATROPIUM-ALBUTEROL 0.5-2.5 (3) MG/3ML IN SOLN
3.0000 mL | Freq: Four times a day (QID) | RESPIRATORY_TRACT | Status: DC
  Administered 2014-06-25 – 2014-06-26 (×5): 3 mL via RESPIRATORY_TRACT
  Filled 2014-06-25 (×5): qty 3

## 2014-06-25 NOTE — Progress Notes (Signed)
Patient assisted OOB to [facility] Hights to void. Patient feels very weak. While standing to get back to bed, patient sat down on floor. Patient did not hit any body part on floor. 3 RN's assisted patient back to standing position and back to bed. Patient stated she feels fine. "Nothing hurts." Will continue to monitor and access patient.

## 2014-06-25 NOTE — Progress Notes (Signed)
[place] TEAM 1 - Stepdown/ICU TEAM Progress Note  Alexa Stephens KZS:[id number] DOB: 09/15/1950 DOA: 06/23/2014 PCP: [name], DO  Admit HPI / Brief Narrative: 64 y.o. 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 Pt w/ acute onset of worsening cough over the past days. Progressive SOB. No fevers or chills. No nausea or vomiting. Wears 3-4 L at home chronically. Has been compliant w/ this. Non smoker. Actively receiving chemotherapy for small cell lung cancer. Followed by pulm outpt and [facility]. Pt also w/ worsening abd distension and pain per family.  Presents to the ER T 98.4 heart rate in the 110s to 120s, respirations and 20s, blood pressure in the 120s to 160s. Satting in the mid and 90s with nonrebreather at 57%. White blood cell count 7.1, hemoglobin 11.4, creatinine 0.36. Chest x-ray shows mild left basilar airspace opacity concerning for pneumonia. Started on Rocephin and azithromycin empirically. Assessment and Plan: Alexa Stephens is a 89 y.o. year old female presenting with acute on chronic resp failure w/ hypoxia, CAP Active Problems:  Acute on chronic respiratory failure with hypoxia  Abdominal distension  HPI/Subjective: 4/28 lethargic, but arousable, A/O 4, but confused (patient has received nebulizer during the day but does not recall) patient states positive SOB, negative CP, negative N/V   Assessment/Plan: Acute on chronic resp failure w/ hypoxia/HCAP -likely secondary to HCAP in setting of stage 4 small cell lung cancer -IV rocephin and azithromycin -nonrebreather -stat ABG-BiPAP as clinically indicated -PCCM c/s if marked resp acidosis as son willing to consider intubation -blood culture,  -urine strep antigen and legionella urine antigen negative  Slight respiratory acidosis  compensated -See acute on chronic respiratory  Stage 4 lung cancer/metastases to brain/liver/left femur -s/p recent chemotherapy cycle w/ neulasta -noted metastatic disease on recent MRI of the brain -followed by cancer center -Notify oncology of patient's admission; believe appropriate to have palliative care/hospice discussion with patient's family  Abd distension/Adb pain  -mild -moderate pain on exam w/ noted abd distension -noted baseline metastatic small cell lung Ca -CT abd and pelvis to coorelate  -NPO   COPD exacerbation  -active inspiratory wheezing on exam -cont nebs -follow  HTN -BP stable  -cont home regimen   Sinus tachycardia -Most likely multifactorial to include brain metastases,HCAP, and severe COPD - Metoprolol IV 2.5 mg QID for patient's comfort    Code Status: FULL Family Communication: no family present at time of exam Disposition Plan: Resolution HCAP, palliative care?, Hospice?    Consultants: Estill Bakes (PCCM)    Procedure/Significant Events: 4/27 CT abdomen pelvis with contrast;. -Consolidation and much of the left lower lobe and in the posterior basal segment right lower lobe, PNA vs pulmonary hemorrhage. -Hepatic masses compatible with metastatic disease reduced in size over the past 2 months. -known osseous metastatic disease to the left proximal femur. - Severe emphysema.- Mild distal esophageal wall thickening -esophagitis not excluded.    Culture 4/27 MRSA by PCR positive 4/28 legionella urine antigen negative 4/28 strep pneumo urine antigen negative   Antibiotics: Vancomycin 4/27 > Zosyn 4/27 >  DVT prophylaxis: Subcutaneous heparin   Devices Right chest wall Port-A-Cath   LINES / TUBES:      Continuous Infusions: . sodium chloride 75 mL/hr at 06/25/14 0410    Objective: VITAL SIGNS: Temp: 98.3 F (36.8 C) (  04/28 1900) Temp Source: Oral (04/28 1900) BP: 110/64 mmHg (04/28 2000) Pulse Rate: 114  (04/28 2000) SPO2; FIO2:   Intake/Output Summary (Last 24 hours) at 06/25/14 2305 Last data filed at 06/25/14 2013  Gross per 24 hour  Intake   2235 ml  Output      0 ml  Net   2235 ml     Exam: General: Lethargic, A/O 4, some confusion,  acute on chronic respiratory distress Lungs: tachypneic, diffuse poor air movement, negative breath sounds LLL, inspiratory wheezing, without  crackles Cardiovascular: Sinus tachycardia, Regular rhythm without murmur gallop or rub normal S1 and S2 Abdomen: Nontender, nondistended, soft, bowel sounds positive, no rebound, no ascites, no appreciable mass Extremities: No significant cyanosis, clubbing, or edema bilateral lower extremities  Data Reviewed: Basic Metabolic Panel:  Recent Labs Lab 06/22/14 1608 06/23/14 2353 06/24/14 0245 06/25/14 0315 06/25/14 2121  NA 141 136 136 137 137  K 4.3 4.4 4.3 4.3 4.0  CL  --  91* 96 95* 96  CO2 34* 34* 34* 37* 34*  GLUCOSE 137 180* 255* 246* 300*  BUN 19.'6 23 21 8 11  '$ CREATININE 0.5* 0.36* 0.35* 0.38* 0.50  CALCIUM 8.6 8.8 8.0* 8.7 8.5   Liver Function Tests:  Recent Labs Lab 06/22/14 1608 06/23/14 2353 06/24/14 0245 06/25/14 0315 06/25/14 2121  AST '17 29 22 21 19  '$ ALT 47 51* 46* 39* 40*  ALKPHOS 219* 149* 133* 83 97  BILITOT 0.30 0.5 0.2* 0.4 0.3  PROT 6.1* 6.3 5.3* 4.8* 5.5*  ALBUMIN 3.4* 3.5 2.9* 2.3* 2.5*   No results for input(s): LIPASE, AMYLASE in the last 168 hours. No results for input(s): AMMONIA in the last 168 hours. CBC:  Recent Labs Lab 06/22/14 1608 06/23/14 2353 06/24/14 0245 06/25/14 0315  WBC 26.8* 7.1 3.2* 2.5*  NEUTROABS 24.9* 4.2 1.7  --   HGB 10.1* 11.4* 9.9* 8.5*  HCT 32.1* 35.6* 31.4* 26.0*  MCV 99.5 101.1* 100.3* 96.7  PLT 226 176 147* 211   Cardiac Enzymes: No results for input(s): CKTOTAL, CKMB, CKMBINDEX, TROPONINI in the last 168 hours. BNP (last 3 results) No results for input(s): BNP in the last 8760 hours.  ProBNP (last 3 results) No  results for input(s): PROBNP in the last 8760 hours.  CBG: No results for input(s): GLUCAP in the last 168 hours.  Recent Results (from the past 240 hour(s))  Culture, blood (routine x 2)     Status: None (Preliminary result)   Collection Time: 06/23/14 11:40 PM  Result Value Ref Range Status   Specimen Description BLOOD RIGHT ANTECUBITAL  Final   Special Requests BOTTLES DRAWN AEROBIC AND ANAEROBIC 5CC EACH  Final   Culture   Final           BLOOD CULTURE RECEIVED NO GROWTH TO DATE CULTURE WILL BE HELD FOR 5 DAYS BEFORE ISSUING A FINAL NEGATIVE REPORT Performed at [facility]    Report Status PENDING  Incomplete  Culture, blood (routine x 2)     Status: None (Preliminary result)   Collection Time: 06/23/14 11:50 PM  Result Value Ref Range Status   Specimen Description BLOOD RIGHT WRIST  Final   Special Requests BOTTLES DRAWN AEROBIC AND ANAEROBIC 5CC EACH  Final   Culture   Final           BLOOD CULTURE RECEIVED NO GROWTH TO DATE CULTURE WILL BE HELD FOR 5 DAYS BEFORE ISSUING A FINAL NEGATIVE REPORT Performed at [facility]  Report Status PENDING  Incomplete  Urine culture     Status: None (Preliminary result)   Collection Time: 06/24/14  2:08 AM  Result Value Ref Range Status   Specimen Description URINE, CLEAN CATCH  Final   Special Requests NONE  Final   Culture   Final    Culture reincubated for better growth Performed at [facility]    Report Status PENDING  Incomplete  MRSA PCR Screening     Status: Abnormal   Collection Time: 06/24/14  2:05 PM  Result Value Ref Range Status   MRSA by PCR POSITIVE (A) NEGATIVE Final    Comment:        The GeneXpert MRSA Assay (FDA approved for NASAL specimens only), is one component of a comprehensive MRSA colonization surveillance program. It is not intended to diagnose MRSA infection nor to guide or monitor treatment for MRSA infections. RESULT CALLED TO, READ BACK BY AND VERIFIED WITH: A.  BARLOW RN 17:00 06/24/14 ([email or web address])      Studies:  Recent x-ray studies have been reviewed in detail by the Attending Physician  Scheduled Meds:  Scheduled Meds: . Chlorhexidine Gluconate Cloth  6 each Topical Q0600  . dexamethasone  4 mg Oral BID  . heparin  5,000 Units Subcutaneous 3 times per day  . ipratropium-albuterol  3 mL Nebulization QID  . metoprolol  2.5 mg Intravenous 4 times per day  . mupirocin ointment  1 application Nasal BID  . pantoprazole  40 mg Oral Daily  . piperacillin-tazobactam (ZOSYN)  IV  3.375 g Intravenous Q8H  . simvastatin  20 mg Oral Daily  . vancomycin  500 mg Intravenous Q12H    Time spent on care of this patient: 40 mins   Amamda Curbow, Geraldo Docker , MD  [facility] Office  [phone] Pager [phone]  On-Call/Text Page:      [name]      password [email or web address]  If 7PM-7AM, please contact night-coverage [email or web address] Password [email or web address] 06/25/2014, 11:05 PM   LOS: 1 day   Care during the described time interval was provided by me .  I have reviewed this patient's available data, including medical history, events of note, physical examination, radiology studies and test results as part of my evaluation  Dia Crawford, MD [phone] Pager

## 2014-06-26 ENCOUNTER — Ambulatory Visit (HOSPITAL_COMMUNITY): Payer: Medicare Other

## 2014-06-26 ENCOUNTER — Other Ambulatory Visit: Payer: Self-pay | Admitting: Medical Oncology

## 2014-06-26 ENCOUNTER — Telehealth: Payer: Self-pay | Admitting: Internal Medicine

## 2014-06-26 LAB — URINE CULTURE: Colony Count: 70000

## 2014-06-26 LAB — GLUCOSE, CAPILLARY
GLUCOSE-CAPILLARY: 207 mg/dL — AB (ref 70–99)
Glucose-Capillary: 232 mg/dL — ABNORMAL HIGH (ref 70–99)
Glucose-Capillary: 399 mg/dL — ABNORMAL HIGH (ref 70–99)

## 2014-06-26 MED ORDER — SENNOSIDES-DOCUSATE SODIUM 8.6-50 MG PO TABS
1.0000 | ORAL_TABLET | Freq: Two times a day (BID) | ORAL | Status: DC
  Administered 2014-06-26 – 2014-07-04 (×13): 1 via ORAL
  Filled 2014-06-26 (×13): qty 1

## 2014-06-26 MED ORDER — IPRATROPIUM-ALBUTEROL 0.5-2.5 (3) MG/3ML IN SOLN
3.0000 mL | RESPIRATORY_TRACT | Status: DC | PRN
  Administered 2014-06-26 – 2014-06-27 (×5): 3 mL via RESPIRATORY_TRACT
  Filled 2014-06-26 (×5): qty 3

## 2014-06-26 MED ORDER — POLYETHYLENE GLYCOL 3350 17 G PO PACK
17.0000 g | PACK | Freq: Two times a day (BID) | ORAL | Status: DC
  Administered 2014-06-26 – 2014-07-05 (×14): 17 g via ORAL
  Filled 2014-06-26 (×16): qty 1

## 2014-06-26 MED ORDER — OXYCODONE HCL 5 MG PO TABS
5.0000 mg | ORAL_TABLET | ORAL | Status: DC | PRN
  Administered 2014-06-26 – 2014-07-06 (×19): 5 mg via ORAL
  Filled 2014-06-26 (×19): qty 1

## 2014-06-26 MED ORDER — INSULIN ASPART 100 UNIT/ML ~~LOC~~ SOLN
0.0000 [IU] | Freq: Every day | SUBCUTANEOUS | Status: DC
  Administered 2014-06-26: 2 [IU] via SUBCUTANEOUS
  Administered 2014-06-27 – 2014-06-30 (×4): 3 [IU] via SUBCUTANEOUS
  Administered 2014-07-02: 2 [IU] via SUBCUTANEOUS
  Administered 2014-07-03 – 2014-07-05 (×2): 3 [IU] via SUBCUTANEOUS

## 2014-06-26 MED ORDER — SALMETEROL XINAFOATE 50 MCG/DOSE IN AEPB
1.0000 | INHALATION_SPRAY | Freq: Two times a day (BID) | RESPIRATORY_TRACT | Status: DC
  Administered 2014-06-27 – 2014-07-06 (×16): 1 via RESPIRATORY_TRACT
  Filled 2014-06-26 (×3): qty 0

## 2014-06-26 MED ORDER — METOPROLOL TARTRATE 12.5 MG HALF TABLET
12.5000 mg | ORAL_TABLET | Freq: Two times a day (BID) | ORAL | Status: DC
  Administered 2014-06-26 – 2014-06-27 (×2): 12.5 mg via ORAL
  Filled 2014-06-26 (×4): qty 1

## 2014-06-26 MED ORDER — INSULIN ASPART 100 UNIT/ML ~~LOC~~ SOLN
0.0000 [IU] | Freq: Three times a day (TID) | SUBCUTANEOUS | Status: DC
  Administered 2014-06-26: 9 [IU] via SUBCUTANEOUS
  Administered 2014-06-26: 3 [IU] via SUBCUTANEOUS
  Administered 2014-06-27: 2 [IU] via SUBCUTANEOUS
  Administered 2014-06-27: 9 [IU] via SUBCUTANEOUS
  Administered 2014-06-27: 2 [IU] via SUBCUTANEOUS
  Administered 2014-06-28 – 2014-06-29 (×4): 3 [IU] via SUBCUTANEOUS
  Administered 2014-06-29: 2 [IU] via SUBCUTANEOUS
  Administered 2014-06-29 – 2014-06-30 (×2): 3 [IU] via SUBCUTANEOUS
  Administered 2014-06-30: 1 [IU] via SUBCUTANEOUS
  Administered 2014-06-30: 5 [IU] via SUBCUTANEOUS
  Administered 2014-07-01: 2 [IU] via SUBCUTANEOUS
  Administered 2014-07-01: 3 [IU] via SUBCUTANEOUS
  Administered 2014-07-02 (×2): 5 [IU] via SUBCUTANEOUS
  Administered 2014-07-02: 3 [IU] via SUBCUTANEOUS
  Administered 2014-07-03: 5 [IU] via SUBCUTANEOUS
  Administered 2014-07-03 – 2014-07-04 (×3): 2 [IU] via SUBCUTANEOUS
  Administered 2014-07-04: 3 [IU] via SUBCUTANEOUS
  Administered 2014-07-05 (×2): 2 [IU] via SUBCUTANEOUS
  Administered 2014-07-06: 5 [IU] via SUBCUTANEOUS
  Administered 2014-07-06 (×2): 2 [IU] via SUBCUTANEOUS

## 2014-06-26 NOTE — Progress Notes (Signed)
[place] TEAM 1 - Stepdown/ICU TEAM Progress Note  Alexa Stephens OZH:[id number] DOB: 1950-07-05 DOA: 06/23/2014 PCP: [name], DO  Admit HPI / Brief Narrative: 64 y.o. female with history of stage 4 small cell lung cancer, chronic resp failure on 3-4L O2 at home, COPD, HTN, and anxietywho presented with acute on chronic resp failure w/ hypoxia. Pt reported the acute onset of cough over a few days with progressive SOB. Actively receiving chemotherapy for small cell lung cancer. Followed by pulm outpt and [facility].   In the ER T 98.4 heart rate in the 110s to 120s, respirations 20s, blood pressure in the 120s to 160s. Satting in the mid 90s with nonrebreather. White blood cell count 7.1, hemoglobin 11.4, creatinine 0.36. Chest x-ray noted mild left basilar airspace opacity concerning for pneumonia.   HPI/Subjective: The patient states that she does not yet feel any better.  She reports ongoing dyspnea.  She denies chest pain fevers chills nausea or vomiting.  She does complain of generalized abdominal pain and states that she still feels constipated.  Assessment/Plan:  Acute on chronic hypoxemic and hypercarbic respiratory failure Due to HCAP in setting of stage 4 lung CA - wean oxygen as able  Left and right basilar HCAP Continue empiric antibiotic therapy - infiltrates better appreciated on CT of abdomen  COPD without evidence of exacerbation Stable exam with no wheezing today  Stage IV SCLC with metastases to brain, liver, bone Currently undergoing chemotherapy - metastatic disease appreciated on recent MRI of the brain - CT abdomen this admit notes decrease in size of hepatic metastatic disease compared to 2 months ago  Abdominal distention with pain Constipation noted on CT abdomen but no new findings appreciated with some evidence of decreased size of hepatic metastases - will accelerate bowel regimen and follow  HTN Blood pressure currently recently  controlled  Sinus tachycardia Resolved at this time - likely primarily related to dehydration  Hyperglycemia Likely steroid induced - check A1c  Code Status: NO CODE BLUE Family Communication: no family present at time of exam Disposition Plan: SDU  Consultants: PCCM  Procedures: None  Antibiotics: Vancomycin 4/27 > Zosyn 4/27 >  DVT prophylaxis: Subcutaneous heparin  Objective: Blood pressure 142/79, pulse 82, temperature 98.2 F (36.8 C), temperature source Oral, resp. rate 25, height '5\' 1"'$  (1.549 m), weight 46.5 kg (102 lb 8.2 oz), SpO2 100 %.  Intake/Output Summary (Last 24 hours) at 06/26/14 1006 Last data filed at 06/26/14 0700  Gross per 24 hour  Intake 2248.75 ml  Output    600 ml  Net 1648.75 ml   Exam: General: No acute respiratory distress but mildly tachypneic Lungs: Poor air movement throughout without active wheeze Cardiovascular: Distant heart sounds but regular rate without appreciable murmur gallop or rub Abdomen: Mildly tender diffusely, nondistended, soft, bowel sounds positive, no rebound, no ascites, no appreciable mass Extremities: No significant cyanosis, clubbing, or edema bilateral lower extremities  Data Reviewed: Basic Metabolic Panel:  Recent Labs Lab 06/22/14 1608 06/23/14 2353 06/24/14 0245 06/25/14 0315 06/25/14 2121  NA 141 136 136 137 137  K 4.3 4.4 4.3 4.3 4.0  CL  --  91* 96 95* 96  CO2 34* 34* 34* 37* 34*  GLUCOSE 137 180* 255* 246* 300*  BUN 19.'6 23 21 8 11  '$ CREATININE 0.5* 0.36* 0.35* 0.38* 0.50  CALCIUM 8.6 8.8 8.0* 8.7 8.5    Liver Function Tests:  Recent Labs Lab 06/22/14 1608 06/23/14 2353 06/24/14 0245  06/25/14 0315 06/25/14 2121  AST '17 29 22 21 19  '$ ALT 47 51* 46* 39* 40*  ALKPHOS 219* 149* 133* 83 97  BILITOT 0.30 0.5 0.2* 0.4 0.3  PROT 6.1* 6.3 5.3* 4.8* 5.5*  ALBUMIN 3.4* 3.5 2.9* 2.3* 2.5*   CBC:  Recent Labs Lab 06/22/14 1608 06/23/14 2353 06/24/14 0245 06/25/14 0315  WBC 26.8* 7.1  3.2* 2.5*  NEUTROABS 24.9* 4.2 1.7  --   HGB 10.1* 11.4* 9.9* 8.5*  HCT 32.1* 35.6* 31.4* 26.0*  MCV 99.5 101.1* 100.3* 96.7  PLT 226 176 147* 211   Studies:   Recent x-ray studies have been reviewed in detail by the Attending Physician  Scheduled Meds:  Scheduled Meds: . Chlorhexidine Gluconate Cloth  6 each Topical Q0600  . dexamethasone  4 mg Oral BID  . heparin  5,000 Units Subcutaneous 3 times per day  . ipratropium-albuterol  3 mL Nebulization QID  . metoprolol  2.5 mg Intravenous 4 times per day  . mupirocin ointment  1 application Nasal BID  . pantoprazole  40 mg Oral Daily  . piperacillin-tazobactam (ZOSYN)  IV  3.375 g Intravenous Q8H  . simvastatin  20 mg Oral Daily  . vancomycin  500 mg Intravenous Q12H    Time spent on care of this patient: >35 minutes   Cherene Altes , MD   [facility] Office  [phone] Pager - Text Page per [name] as per below:  On-Call/Text Page:      [name]      password [email or web address]  If 7PM-7AM, please contact night-coverage [email or web address] Password [email or web address] 06/26/2014, 10:06 AM   LOS: 2 days

## 2014-06-26 NOTE — Telephone Encounter (Signed)
Alexa Stephens pt husband and advised on cx appt and new appts for May.Marland KitchenMarland Kitchen

## 2014-06-27 LAB — GLUCOSE, CAPILLARY
Glucose-Capillary: 157 mg/dL — ABNORMAL HIGH (ref 70–99)
Glucose-Capillary: 173 mg/dL — ABNORMAL HIGH (ref 70–99)
Glucose-Capillary: 336 mg/dL — ABNORMAL HIGH (ref 70–99)
Glucose-Capillary: 354 mg/dL — ABNORMAL HIGH (ref 70–99)
Glucose-Capillary: 453 mg/dL — ABNORMAL HIGH (ref 70–99)
Glucose-Capillary: 265 mg/dL — ABNORMAL HIGH (ref 70–99)

## 2014-06-27 LAB — COMPREHENSIVE METABOLIC PANEL
ALK PHOS: 113 U/L (ref 39–117)
ALT: 39 U/L — AB (ref 0–35)
ANION GAP: 8 (ref 5–15)
AST: 25 U/L (ref 0–37)
Albumin: 2.3 g/dL — ABNORMAL LOW (ref 3.5–5.2)
BUN: 15 mg/dL (ref 6–23)
CO2: 35 mmol/L — AB (ref 19–32)
Calcium: 8.4 mg/dL (ref 8.4–10.5)
Chloride: 96 mmol/L (ref 96–112)
Creatinine, Ser: 0.34 mg/dL — ABNORMAL LOW (ref 0.50–1.10)
GFR calc Af Amer: >90 mL/min (ref >90)
GFR calc non Af Amer: >90 mL/min (ref >90)
Glucose, Bld: 132 mg/dL — ABNORMAL HIGH (ref 70–99)
POTASSIUM: 4.8 mmol/L (ref 3.5–5.1)
SODIUM: 139 mmol/L (ref 135–145)
TOTAL PROTEIN: 5.1 g/dL — AB (ref 6.0–8.3)
Total Bilirubin: 0.6 mg/dL (ref 0.3–1.2)

## 2014-06-27 LAB — CBC
HEMATOCRIT: 25.2 % — AB (ref 36.0–46.0)
Hemoglobin: 8.1 g/dL — ABNORMAL LOW (ref 12.0–15.0)
MCH: 32.5 pg (ref 26.0–34.0)
MCHC: 32.1 g/dL (ref 30.0–36.0)
MCV: 101.2 fL — ABNORMAL HIGH (ref 78.0–100.0)
PLATELETS: 161 10*3/uL (ref 150–400)
RBC: 2.49 MIL/uL — ABNORMAL LOW (ref 3.87–5.11)
RDW: 19.5 % — AB (ref 11.5–15.5)
WBC: 14.7 10*3/uL — ABNORMAL HIGH (ref 4.0–10.5)

## 2014-06-27 MED ORDER — VANCOMYCIN HCL IN DEXTROSE 1-5 GM/200ML-% IV SOLN
1000.0000 mg | INTRAVENOUS | Status: DC
  Administered 2014-06-28 – 2014-07-01 (×4): 1000 mg via INTRAVENOUS
  Filled 2014-06-27 (×4): qty 200

## 2014-06-27 MED ORDER — METOPROLOL TARTRATE 25 MG PO TABS
25.0000 mg | ORAL_TABLET | Freq: Two times a day (BID) | ORAL | Status: DC
  Administered 2014-06-27 – 2014-07-06 (×18): 25 mg via ORAL
  Filled 2014-06-27 (×18): qty 1

## 2014-06-27 NOTE — Progress Notes (Signed)
Talked with patient about care at home prior to hospitalization. I am concerned because she has skin breakdown on sacrum and is incontinent of stool and urine. Patient stated her son helps her at home, but he has to work and she is alone during that time. She states that she is able to get around her house on her own with a walker, however, when I attempted to ambulate her this morning she was not able to support her own weight to sit on the edge of the bed. Patient also mentioned that she has chronic pain at home, but does not take her prescribed pain medication, unclear if this is because she does not vocalize she is in pain and needs her medication.

## 2014-06-27 NOTE — Progress Notes (Signed)
Pt C/O being anxious. Emotional support given and 1 mg Ativan given.

## 2014-06-27 NOTE — Progress Notes (Signed)
Received from [place] via bed; son called and notified of new room number; patient is alert; no acute distress.

## 2014-06-27 NOTE — Progress Notes (Signed)
ANTIBIOTIC CONSULT NOTE - FOLLOW UP  Pharmacy Consult for Vancomycin + Zosyn Indication: pneumonia  Allergies  Allergen Reactions  . Benadryl [Diphenhydramine]     hyperactive  . Compazine [Prochlorperazine Edisylate] Other (See Comments)    Didn't work for her  . Excedrin Extra Strength [Aspirin-Acetaminophen-Caffeine] Other (See Comments)    "kidney aches"  . Flexeril [Cyclobenzaprine] Other (See Comments)    SKIN NUMB  . Lexapro [Escitalopram] Other (See Comments)    Gi upset  . Naproxen Other (See Comments)    High BP  . Skelaxin [Metaxalone] Other (See Comments)    DIDN'T WORK  . Tylenol [Acetaminophen] Other (See Comments)    Difficulty breathing    Patient Measurements: Height: '5\' 1"'$  (154.9 cm) Weight: 102 lb 8.2 oz (46.5 kg) IBW/kg (Calculated) : 47.8  Vital Signs: Temp: 98.1 F (36.7 C) (04/30 0801) Temp Source: Oral (04/30 0801) BP: 139/78 mmHg (04/30 1200) Pulse Rate: 84 (04/30 1200) Intake/Output from previous day: 04/29 0701 - 04/30 0700 In: 2941.3 [P.O.:1080; I.V.:1511.3; IV Piggyback:350] Out: 0973 [Urine:1825] Intake/Output from this shift: Total I/O In: 720 [P.O.:370; I.V.:300; IV Piggyback:50] Out: 250 [Urine:250]  Labs:  Recent Labs  06/25/14 0315 06/25/14 2121 06/27/14 0241  WBC 2.5*  --  14.7*  HGB 8.5*  --  8.1*  PLT 211  --  161  CREATININE 0.38* 0.50 0.34*   Estimated Creatinine Clearance: 51.5 mL/min (by C-G formula based on Cr of 0.34). No results for input(s): VANCOTROUGH, VANCOPEAK, VANCORANDOM, GENTTROUGH, GENTPEAK, GENTRANDOM, TOBRATROUGH, TOBRAPEAK, TOBRARND, AMIKACINPEAK, AMIKACINTROU, AMIKACIN in the last 72 hours.   Microbiology: Recent Results (from the past 720 hour(s))  Culture, blood (routine x 2)     Status: None (Preliminary result)   Collection Time: 06/23/14 11:40 PM  Result Value Ref Range Status   Specimen Description BLOOD RIGHT ANTECUBITAL  Final   Special Requests BOTTLES DRAWN AEROBIC AND ANAEROBIC 5CC  EACH  Final   Culture   Final           BLOOD CULTURE RECEIVED NO GROWTH TO DATE CULTURE WILL BE HELD FOR 5 DAYS BEFORE ISSUING A FINAL NEGATIVE REPORT Performed at [facility]    Report Status PENDING  Incomplete  Culture, blood (routine x 2)     Status: None (Preliminary result)   Collection Time: 06/23/14 11:50 PM  Result Value Ref Range Status   Specimen Description BLOOD RIGHT WRIST  Final   Special Requests BOTTLES DRAWN AEROBIC AND ANAEROBIC 5CC EACH  Final   Culture   Final           BLOOD CULTURE RECEIVED NO GROWTH TO DATE CULTURE WILL BE HELD FOR 5 DAYS BEFORE ISSUING A FINAL NEGATIVE REPORT Performed at [facility]    Report Status PENDING  Incomplete  Urine culture     Status: None   Collection Time: 06/24/14  2:08 AM  Result Value Ref Range Status   Specimen Description URINE, CLEAN CATCH  Final   Special Requests NONE  Final   Colony Count   Final    70,000 COLONIES/ML Performed at [facility]    Culture   Final    Multiple bacterial morphotypes present, none predominant. Suggest appropriate recollection if clinically indicated. Performed at [facility]    Report Status 06/26/2014 FINAL  Final  MRSA PCR Screening     Status: Abnormal   Collection Time: 06/24/14  2:05 PM  Result Value Ref Range Status   MRSA by PCR POSITIVE (A)  NEGATIVE Final    Comment:        The GeneXpert MRSA Assay (FDA approved for NASAL specimens only), is one component of a comprehensive MRSA colonization surveillance program. It is not intended to diagnose MRSA infection nor to guide or monitor treatment for MRSA infections. RESULT CALLED TO, READ BACK BY AND VERIFIED WITH: A. BARLOW RN 17:00 06/24/14 ([email or web address])     Anti-infectives    Start     Dose/Rate Route Frequency Ordered Stop   06/24/14 1200  piperacillin-tazobactam (ZOSYN) IVPB 3.375 g     3.375 g 12.5 mL/hr over 240 Minutes Intravenous Every 8 hours 06/24/14 0530     06/24/14 0600   vancomycin (VANCOCIN) 500 mg in sodium chloride 0.9 % 100 mL IVPB     500 mg 100 mL/hr over 60 Minutes Intravenous Every 12 hours 06/24/14 0530     06/24/14 0545  piperacillin-tazobactam (ZOSYN) IVPB 3.375 g     3.375 g 100 mL/hr over 30 Minutes Intravenous  Once 06/24/14 0530 06/24/14 0646   06/24/14 0215  cefTRIAXone (ROCEPHIN) 1 g in dextrose 5 % 50 mL IVPB  Status:  Discontinued     1 g 100 mL/hr over 30 Minutes Intravenous Every 24 hours 06/24/14 0202 06/24/14 0514   06/24/14 0215  azithromycin (ZITHROMAX) 500 mg in dextrose 5 % 250 mL IVPB  Status:  Discontinued     500 mg 250 mL/hr over 60 Minutes Intravenous Every 24 hours 06/24/14 0202 06/24/14 0514   06/24/14 0045  cefTRIAXone (ROCEPHIN) 1 g in dextrose 5 % 50 mL IVPB     1 g 100 mL/hr over 30 Minutes Intravenous  Once 06/24/14 0042 06/24/14 0214   06/24/14 0045  azithromycin (ZITHROMAX) 500 mg in dextrose 5 % 250 mL IVPB     500 mg 250 mL/hr over 60 Minutes Intravenous  Once 06/24/14 0042 06/24/14 0214      Assessment: 64 yo f admitted for SOB.  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 CT abdomen and CXR concerning for pneumonia, so we are treating with empiric antibiotics. Patient is on vancomycin 500 mg IV q12h, but due to a change in renal function, will change dose to 1,000 mg IV q24h. SCr 0.35, CrCl ~ 50 ml/min. Wbc 14.7, afebrile.  Goal of Therapy:  Vancomycin trough level 15-20 mcg/ml  Plan:  Change vancomycin to 1,000 mg IV q24h Continue Zosyn 3.375 gm IV q8h (4 hr infusion) Monitor cx, renal fx, CBC, clinical course  Cassie L. Nicole Kindred, PharmD Clinical Pharmacy Resident Pager: [phone] 06/27/2014 1:15 PM

## 2014-06-27 NOTE — Progress Notes (Signed)
[place] TEAM 1 - Stepdown/ICU TEAM Progress Note  Alexa Stephens SPQ:[id number] DOB: [birth year] DOA: 06/23/2014 PCP: [name], DO  Admit HPI / Brief Narrative: 64 y.o. female with history of stage 4 small cell lung cancer, chronic resp failure on 3-4L O2 at home, COPD, HTN, and anxietywho presented with acute on chronic resp failure w/ hypoxia. Pt reported the acute onset of cough over a few days with progressive SOB. Actively receiving chemotherapy for small cell lung cancer. Followed by pulm outpt and [place] [facility].   In the ER T 98.4 heart rate in the 110s to 120s, respirations 20s, blood pressure in the 120s to 160s. Satting in the mid 90s with nonrebreather. White blood cell count 7.1, hemoglobin 11.4, creatinine 0.36. Chest x-ray noted mild left basilar airspace opacity concerning for pneumonia.   HPI/Subjective: The patient admits that she feels a little bit better today.  She is resting comfortably in the bed and does not appear to be in any respiratory distress.  She says she has a poor appetite but denies chest pain abdominal pain nausea or vomiting.  Assessment/Plan:  Acute on chronic hypoxemic and hypercarbic respiratory failure Due to HCAP in setting of stage 4 lung CA - wean oxygen as able - patient is improving slowly but steadily  Left and right basilar HCAP Continue empiric antibiotic therapy - infiltrates better appreciated on CT of abdomen - plan to complete 7 days of therapy  COPD without evidence of exacerbation Quiescent on follow-up exam  Stage IV SCLC with metastases to brain, liver, bone Currently undergoing chemotherapy - metastatic disease appreciated on recent MRI of the brain - CT abdomen this admit notes decrease in size of hepatic metastatic disease compared to 2 months ago - patient avoids questions regarding her long-term treatment goals and is not willing to discuss palliative issues  Abdominal distention with pain Constipation noted on  CT abdomen but no new findings appreciated with some evidence of decreased size of hepatic metastases - success has been accomplished with accelerated bowel regimen - continue maintenance regimen  HTN Blood pressure has been trending upward - adjust treatment plan and follow  Sinus tachycardia Resolved at this time - likely primarily related to dehydration  Hyperglycemia Likely steroid induced - A1c pending  Code Status: NO CODE BLUE Family Communication: no family present at time of exam Disposition Plan: Transfer to medical bed - PT/OT evaluations - I suspect the patient will require SNF placement  Consultants: PCCM  Procedures: None  Antibiotics: Vancomycin 4/26 > Zosyn 4/26 >  DVT prophylaxis: Subcutaneous heparin  Objective: Blood pressure 139/78, pulse 84, temperature 97.9 F (36.6 C), temperature source Oral, resp. rate 22, height '5\' 1"'$  (1.549 m), weight 46.5 kg (102 lb 8.2 oz), SpO2 96 %.  Intake/Output Summary (Last 24 hours) at 06/27/14 1331 Last data filed at 06/27/14 1200  Gross per 24 hour  Intake   2700 ml  Output   1875 ml  Net    825 ml   Exam: General: No acute respiratory distress - respirations appear more comfortable today Lungs: Poor air movement throughout without active wheeze Cardiovascular: Distant heart sounds but regular rate without murmur gallop or rub Abdomen: Mildly tender diffusely, nondistended, soft, bowel sounds positive, no rebound, no ascites, no appreciable mass Extremities: No significant cyanosis, clubbing, or edema bilateral lower extremities  Data Reviewed: Basic Metabolic Panel:  Recent Labs Lab 06/23/14 2353 06/24/14 0245 06/25/14 0315 06/25/14 2121 06/27/14 0241  NA 136 136 137  137 139  K 4.4 4.3 4.3 4.0 4.8  CL 91* 96 95* 96 96  CO2 34* 34* 37* 34* 35*  GLUCOSE 180* 255* 246* 300* 132*  BUN '23 21 8 11 15  '$ CREATININE 0.36* 0.35* 0.38* 0.50 0.34*  CALCIUM 8.8 8.0* 8.7 8.5 8.4    Liver Function  Tests:  Recent Labs Lab 06/23/14 2353 06/24/14 0245 06/25/14 0315 06/25/14 2121 06/27/14 0241  AST '29 22 21 19 25  '$ ALT 51* 46* 39* 40* 39*  ALKPHOS 149* 133* 83 97 113  BILITOT 0.5 0.2* 0.4 0.3 0.6  PROT 6.3 5.3* 4.8* 5.5* 5.1*  ALBUMIN 3.5 2.9* 2.3* 2.5* 2.3*   CBC:  Recent Labs Lab 06/22/14 1608 06/23/14 2353 06/24/14 0245 06/25/14 0315 06/27/14 0241  WBC 26.8* 7.1 3.2* 2.5* 14.7*  NEUTROABS 24.9* 4.2 1.7  --   --   HGB 10.1* 11.4* 9.9* 8.5* 8.1*  HCT 32.1* 35.6* 31.4* 26.0* 25.2*  MCV 99.5 101.1* 100.3* 96.7 101.2*  PLT 226 176 147* 211 161   Studies:   Recent x-ray studies have been reviewed in detail by the Attending Physician  Scheduled Meds:  Scheduled Meds: . Chlorhexidine Gluconate Cloth  6 each Topical Q0600  . dexamethasone  4 mg Oral BID  . heparin  5,000 Units Subcutaneous 3 times per day  . insulin aspart  0-5 Units Subcutaneous QHS  . insulin aspart  0-9 Units Subcutaneous TID WC  . metoprolol tartrate  12.5 mg Oral BID  . mupirocin ointment  1 application Nasal BID  . pantoprazole  40 mg Oral Daily  . piperacillin-tazobactam (ZOSYN)  IV  3.375 g Intravenous Q8H  . polyethylene glycol  17 g Oral BID  . salmeterol  1 puff Inhalation BID  . senna-docusate  1 tablet Oral BID  . simvastatin  20 mg Oral Daily  . [START ON 06/28/2014] vancomycin  1,000 mg Intravenous Q24H    Time spent on care of this patient: 35 minutes   Diavian Furgason T , MD   [facility] Office  [phone] Pager - Text Page per [name] as per below:  On-Call/Text Page:      [name]      password [email or web address]  If 7PM-7AM, please contact night-coverage [email or web address] Password [email or web address] 06/27/2014, 1:31 PM   LOS: 3 days

## 2014-06-28 ENCOUNTER — Inpatient Hospital Stay (HOSPITAL_COMMUNITY): Payer: Medicare Other

## 2014-06-28 LAB — GLUCOSE, CAPILLARY
GLUCOSE-CAPILLARY: 241 mg/dL — AB (ref 70–99)
GLUCOSE-CAPILLARY: 203 mg/dL — AB (ref 70–99)
GLUCOSE-CAPILLARY: 267 mg/dL — AB (ref 70–99)
Glucose-Capillary: 230 mg/dL — ABNORMAL HIGH (ref 70–99)

## 2014-06-28 MED ORDER — IPRATROPIUM-ALBUTEROL 0.5-2.5 (3) MG/3ML IN SOLN

## 2014-06-28 NOTE — Evaluation (Signed)
Physical Therapy Evaluation Patient Details Name: Alexa Stephens MRN: [id number] DOB: 1951-02-06 Today's Date: 06/28/2014   History of Present Illness  64 y.o. female with history of stage 4 small cell lung cancer, chronic resp failure on 3-4L O2 at home, COPD, HTN, and anxietywho presented with acute on chronic resp failure w/ hypoxia. Pt reported the acute onset of cough over a few days with progressive SOB. Chest x-ray noted mild left basilar airspace opacity concerning for pneumonia.   Clinical Impression  Pt admitted with above complications. Pt currently with functional limitations due to the deficits listed below (see PT Problem List). Difficult for patient to provide accurate history. States she lives with son, but is unsure how frequently he works. Required min-Mod assist for safe transfers today. Pt will benefit from skilled PT to increase their independence and safety with mobility to allow discharge to the venue listed below.       Follow Up Recommendations SNF;Supervision/Assistance - 24 hour    Equipment Recommendations   (TBD at next venue of care)    Recommendations for Other Services       Precautions / Restrictions Precautions Precautions: Fall Precaution Comments: monitor O2 Restrictions Weight Bearing Restrictions: No      Mobility  Bed Mobility Overal bed mobility: Needs Assistance Bed Mobility: Rolling;Sidelying to Sit Rolling: Supervision Sidelying to sit: Min assist;HOB elevated       General bed mobility comments: VC for technique. Min assist for truncal assist to rise to edge of bed. Used rail.  Transfers Overall transfer level: Needs assistance Equipment used: 1 person hand held assist Transfers: Sit to/from [facility] Sit to Stand: Min assist Stand pivot transfers: Mod assist       General transfer comment: Min assist to rise from bed which is high for her height. VC for hand placement. Mod assist to pivot to chair with LOB  posteriorly needing assist to correct. Pt anxious with this task reporting severe SOB although 3/4 dyspnea SpO2 was 95% on 5L supplemental O2.  Ambulation/Gait                Stairs            Wheelchair Mobility    Modified Rankin (Stroke Patients Only)       Balance Overall balance assessment: Needs assistance Sitting-balance support: No upper extremity supported;Feet supported Sitting balance-Leahy Scale: Fair     Standing balance support: Single extremity supported Standing balance-Leahy Scale: Poor                               Pertinent Vitals/Pain Pain Assessment: No/denies pain (Feels SOB)    Home Living Family/patient expects to be discharged to:: Private residence Living Arrangements: Children (Lives with son who works) Available Help at Discharge: Family;Available PRN/intermittently (States her son works)             Additional Comments: Pt is a poor historian and has difficulty answering questions. Often unsure of her answer being correct. States she lives with a son who works.    Prior Function Level of Independence: Independent with assistive device(s)         Comments: States she uses a rollator for mobility, although pt giving poor discription of PLOF     Hand Dominance        Extremity/Trunk Assessment   Upper Extremity Assessment: Defer to OT evaluation  Lower Extremity Assessment: Generalized weakness         Communication   Communication: No difficulties  Cognition Arousal/Alertness: Awake/alert Behavior During Therapy: Anxious Overall Cognitive Status: No family/caregiver present to determine baseline cognitive functioning (Unsure PLOF or how much supervision she has at home)                      General Comments General comments (skin integrity, edema, etc.): Sheets saturated with sweat. Temp of 98.0. RN notified of profuse diaphoresis.    Exercises General Exercises - Lower  Extremity Ankle Circles/Pumps: AAROM;Both;10 reps;Seated      Assessment/Plan    PT Assessment Patient needs continued PT services  PT Diagnosis Difficulty walking;Altered mental status;Generalized weakness   PT Problem List Decreased strength;Decreased activity tolerance;Decreased balance;Decreased mobility;Decreased cognition;Decreased knowledge of use of DME;Cardiopulmonary status limiting activity  PT Treatment Interventions DME instruction;Gait training;Stair training;Functional mobility training;Therapeutic activities;Therapeutic exercise;Balance training;Patient/family education   PT Goals (Current goals can be found in the Care Plan section) Acute Rehab PT Goals Patient Stated Goal: None stated PT Goal Formulation: Patient unable to participate in goal setting Time For Goal Achievement: 07/12/14 Potential to Achieve Goals: Fair    Frequency Min 3X/week   Barriers to discharge Decreased caregiver support Does not have 24 hour supervision. Unknown how much supervision pt has.    Co-evaluation               End of Session Equipment Utilized During Treatment: Gait belt;Oxygen (5L) Activity Tolerance: Patient limited by fatigue;Other (comment) (anxious with feeling SOB) Patient left: in chair;with call bell/phone within reach Nurse Communication: Mobility status (Pt diaphoretic)         Time: 3810-1751 PT Time Calculation (min) (ACUTE ONLY): 23 min   Charges:   PT Evaluation $Initial PT Evaluation Tier I: 1 Procedure PT Treatments $Therapeutic Activity: 8-22 mins   PT G Codes:        Ellouise Newer 06/28/2014, 1:32 PM Elayne Snare, [place]

## 2014-06-28 NOTE — Progress Notes (Signed)
Progress Note  Alexa Stephens VEH:[id number] DOB: [birth year] DOA: 06/23/2014 PCP: [name], DO  Admit HPI / Brief Narrative: 64 y.o. female with history of stage 4 small cell lung cancer, chronic resp failure on 3-4L O2 at home, COPD, HTN, and anxietywho presented with acute on chronic resp failure w/ hypoxia. Pt reported the acute onset of cough over a few days with progressive SOB. Actively receiving chemotherapy for small cell lung cancer. Followed by pulm outpt and [place] [facility].   In the ER T 98.4 heart rate in the 110s to 120s, respirations 20s, blood pressure in the 120s to 160s. Satting in the mid 90s with nonrebreather. White blood cell count 7.1, hemoglobin 11.4, creatinine 0.36. Chest x-ray noted mild left basilar airspace opacity concerning for pneumonia.   HPI/Subjective: She is sitting in the chair with 5 liters of [place] oxygen, in mild distress.   Assessment/Plan:  Acute on chronic hypoxemic and hypercarbic respiratory failure Due to HCAP in setting of stage 4 lung CA - wean oxygen as able .  Not much movement of air in the lungs.   Left and right basilar HCAP Continue empiric antibiotic therapy - plan to complete the course of the antibiotics.   COPD without evidence of exacerbation Quiescent on follow-up exam  Stage IV SCLC with metastases to brain, liver, bone Currently undergoing chemotherapy - metastatic disease appreciated on recent MRI of the brain - CT abdomen this admit notes decrease in size of hepatic metastatic disease compared to 2 months ago - patient avoids questions regarding her long-term treatment goals and is not willing to discuss palliative issues  Abdominal distention with pain Constipation noted on CT abdomen but no new findings appreciated with some evidence of decreased size of hepatic metastases - her constipation is resolved with thebowel regimen sh is on right now.   HTN Better controlled.   Sinus tachycardia Improved,  probably from work of breathing.   Hyperglycemia Likely steroid induced - A1c pending.  Code Status: NO CODE BLUE Family Communication: no family present at time of exam Disposition Plan: SNF when bed available.  Consultants: PCCM  Procedures: None  Antibiotics: Vancomycin 4/26 > Zosyn 4/26 >  DVT prophylaxis: Subcutaneous heparin  Objective: Blood pressure 149/77, pulse 102, temperature 97.8 F (36.6 C), temperature source Oral, resp. rate 25, height '5\' 1"'$  (1.549 m), weight 70.1 kg (154 lb 8.7 oz), SpO2 100 %.  Intake/Output Summary (Last 24 hours) at 06/28/14 1400 Last data filed at 06/28/14 0900  Gross per 24 hour  Intake    700 ml  Output    300 ml  Net    400 ml   Exam: General: No acute respiratory distress - respirations appear more comfortable today Lungs: Poor air movement throughout without active wheeze. Cardiovascular: Distant heart sounds but regular rate without murmur gallop or rub Abdomen: Mildly tender diffusely, nondistended, soft, bowel sounds positive, no rebound, no ascites, no appreciable mass Extremities: No significant cyanosis, clubbing, or edema bilateral lower extremities  Data Reviewed: Basic Metabolic Panel:  Recent Labs Lab 06/23/14 2353 06/24/14 0245 06/25/14 0315 06/25/14 2121 06/27/14 0241  NA 136 136 137 137 139  K 4.4 4.3 4.3 4.0 4.8  CL 91* 96 95* 96 96  CO2 34* 34* 37* 34* 35*  GLUCOSE 180* 255* 246* 300* 132*  BUN '23 21 8 11 15  '$ CREATININE 0.36* 0.35* 0.38* 0.50 0.34*  CALCIUM 8.8 8.0* 8.7 8.5 8.4    Liver Function Tests:  Recent  Labs Lab 06/23/14 2353 06/24/14 0245 06/25/14 0315 06/25/14 2121 06/27/14 0241  AST '29 22 21 19 25  '$ ALT 51* 46* 39* 40* 39*  ALKPHOS 149* 133* 83 97 113  BILITOT 0.5 0.2* 0.4 0.3 0.6  PROT 6.3 5.3* 4.8* 5.5* 5.1*  ALBUMIN 3.5 2.9* 2.3* 2.5* 2.3*   CBC:  Recent Labs Lab 06/22/14 1608 06/23/14 2353 06/24/14 0245 06/25/14 0315 06/27/14 0241  WBC 26.8* 7.1 3.2* 2.5* 14.7*    NEUTROABS 24.9* 4.2 1.7  --   --   HGB 10.1* 11.4* 9.9* 8.5* 8.1*  HCT 32.1* 35.6* 31.4* 26.0* 25.2*  MCV 99.5 101.1* 100.3* 96.7 101.2*  PLT 226 176 147* 211 161   Studies:   Recent x-ray studies have been reviewed in detail by the Attending Physician  Scheduled Meds:  Scheduled Meds: . Chlorhexidine Gluconate Cloth  6 each Topical Q0600  . dexamethasone  4 mg Oral BID  . heparin  5,000 Units Subcutaneous 3 times per day  . insulin aspart  0-5 Units Subcutaneous QHS  . insulin aspart  0-9 Units Subcutaneous TID WC  . metoprolol tartrate  25 mg Oral BID  . mupirocin ointment  1 application Nasal BID  . pantoprazole  40 mg Oral Daily  . piperacillin-tazobactam (ZOSYN)  IV  3.375 g Intravenous Q8H  . polyethylene glycol  17 g Oral BID  . salmeterol  1 puff Inhalation BID  . senna-docusate  1 tablet Oral BID  . simvastatin  20 mg Oral Daily  . vancomycin  1,000 mg Intravenous Q24H    Time spent on care of this patient: 35 minutes   Doxie Augenstein , MD   [facility] [phone]  On-Call/Text Page:      [name]      password [email or web address]  If 7PM-7AM, please contact night-coverage [email or web address] Password [email or web address] 06/28/2014, 2:00 PM   LOS: 4 days

## 2014-06-29 ENCOUNTER — Ambulatory Visit: Payer: Medicare Other | Admitting: Internal Medicine

## 2014-06-29 ENCOUNTER — Encounter: Payer: Self-pay | Admitting: Internal Medicine

## 2014-06-29 ENCOUNTER — Ambulatory Visit: Payer: Medicare Other

## 2014-06-29 ENCOUNTER — Other Ambulatory Visit: Payer: Medicare Other

## 2014-06-29 LAB — BASIC METABOLIC PANEL
Anion gap: 10 (ref 5–15)
BUN: 13 mg/dL (ref 6–20)
CHLORIDE: 88 mmol/L — AB (ref 101–111)
CO2: 39 mmol/L — ABNORMAL HIGH (ref 22–32)
Calcium: 9 mg/dL (ref 8.9–10.3)
Creatinine, Ser: 0.46 mg/dL (ref 0.44–1.00)
GFR calc non Af Amer: >60 mL/min (ref >60)
Glucose, Bld: 187 mg/dL — ABNORMAL HIGH (ref 70–99)
Potassium: 5.6 mmol/L — ABNORMAL HIGH (ref 3.5–5.1)
SODIUM: 137 mmol/L (ref 135–145)

## 2014-06-29 LAB — HEMOGLOBIN A1C
HEMOGLOBIN A1C: 7.4 % — AB (ref 4.8–5.6)
MEAN PLASMA GLUCOSE: 166 mg/dL

## 2014-06-29 LAB — CBC
HCT: 29.3 % — ABNORMAL LOW (ref 36.0–46.0)
Hemoglobin: 9.6 g/dL — ABNORMAL LOW (ref 12.0–15.0)
MCH: 32.7 pg (ref 26.0–34.0)
MCHC: 32.8 g/dL (ref 30.0–36.0)
MCV: 99.7 fL (ref 78.0–100.0)
PLATELETS: 165 10*3/uL (ref 150–400)
RBC: 2.94 MIL/uL — ABNORMAL LOW (ref 3.87–5.11)
RDW: 19.9 % — ABNORMAL HIGH (ref 11.5–15.5)
WBC: 35.9 10*3/uL — AB (ref 4.0–10.5)

## 2014-06-29 LAB — GLUCOSE, CAPILLARY
GLUCOSE-CAPILLARY: 190 mg/dL — AB (ref 70–99)
GLUCOSE-CAPILLARY: 269 mg/dL — AB (ref 70–99)
Glucose-Capillary: 210 mg/dL — ABNORMAL HIGH (ref 70–99)
Glucose-Capillary: 226 mg/dL — ABNORMAL HIGH (ref 70–99)

## 2014-06-29 MED ORDER — SODIUM POLYSTYRENE SULFONATE 15 GM/60ML PO SUSP
30.0000 g | Freq: Once | ORAL | Status: AC
  Administered 2014-06-29: 30 g via ORAL
  Filled 2014-06-29: qty 120

## 2014-06-29 NOTE — Progress Notes (Signed)
Physical Therapy Treatment Patient Details Name: Alexa Stephens MRN: [id number] DOB: 1950/09/30 Today's Date: 06/29/2014    History of Present Illness 64 y.o. female with history of stage 4 small cell lung cancer, chronic resp failure on 3-4L O2 at home, COPD, HTN, and anxietywho presented with acute on chronic resp failure w/ hypoxia. Pt reported the acute onset of cough over a few days with progressive SOB. Chest x-ray noted mild left basilar airspace opacity concerning for pneumonia.     PT Comments    Pt con't to be extremely anxious with decreased endurance and generalized weakness. Pt con't to report "I just don't feel like I'm breathing good." Pt on 4Lo2 via [place] with SpO2 at 86-87%. Anxiety limited ambulation progression this date. Acute PT to con't to follow to progress mobility as able.   Follow Up Recommendations  SNF;Supervision/Assistance - 24 hour     Equipment Recommendations       Recommendations for Other Services       Precautions / Restrictions Precautions Precautions: Fall Precaution Comments: monitor O2 Restrictions Weight Bearing Restrictions: No    Mobility  Bed Mobility Overal bed mobility: Needs Assistance Bed Mobility: Rolling;Sidelying to Sit Rolling: Min assist Sidelying to sit: Min assist       General bed mobility comments: max v/c's for encouragment as pt repeated reports "I can't do it" however can  Transfers Overall transfer level: Needs assistance Equipment used: 1 person hand held assist Transfers: Sit to/from Stand Sit to Stand: Min assist Stand pivot transfers: Mod assist       General transfer comment: 2nd person to assist for hygiene. pt stood x 2 min for hygiene with max v/c's for encourgement  Ambulation/Gait             General Gait Details: limted to 2 feet to chair due to anxiety and feeling of not being able to breath   Stairs            Wheelchair Mobility    Modified Rankin (Stroke Patients Only)        Balance Overall balance assessment: Needs assistance Sitting-balance support: No upper extremity supported;Feet supported Sitting balance-Leahy Scale: Fair     Standing balance support: Single extremity supported Standing balance-Leahy Scale: Poor                      Cognition Arousal/Alertness: Awake/alert Behavior During Therapy: Anxious Overall Cognitive Status: No family/caregiver present to determine baseline cognitive functioning                      Exercises      General Comments General comments (skin integrity, edema, etc.): sheets saturated in urine      Pertinent Vitals/Pain Pain Assessment: No/denies pain    Home Living                      Prior Function            PT Goals (current goals can now be found in the care plan section) Acute Rehab PT Goals Patient Stated Goal: go home Progress towards PT goals: Progressing toward goals    Frequency  Min 2X/week    PT Plan Frequency needs to be updated;Current plan remains appropriate    Co-evaluation             End of Session Equipment Utilized During Treatment: Gait belt;Oxygen (4LO2 via [place]) Activity Tolerance: Patient limited by fatigue (limited by  anxiety) Patient left: in chair;with call bell/phone within reach;with nursing/sitter in room     Time: 1459-1519 PT Time Calculation (min) (ACUTE ONLY): 20 min  Charges:  $Therapeutic Activity: 8-22 mins                    G Codes:      Kingsley Callander 06/29/2014, 4:02 PM   Kittie Plater, PT, DPT Pager #: [phone] Office #: [phone]

## 2014-06-29 NOTE — Progress Notes (Signed)
UR COMPLETED  

## 2014-06-29 NOTE — Progress Notes (Signed)
I placed fmla forms for Alexa Stephens on the desk of nurse for Dr. Mckinley Jewel.

## 2014-06-29 NOTE — Progress Notes (Signed)
Neb tx not completed. Pt states she does not want to do tx.  RT will cont to follow

## 2014-06-29 NOTE — Clinical Social Work Note (Signed)
Clinical Social Work Assessment  Patient Details  Name: Alexa Stephens MRN: [id number] Date of Birth: 06-Jun-1950  Date of referral:  06/29/14               Reason for consult:  Facility Placement              Housing/Transportation Living arrangements for the past 2 months:  Single Family Home Source of Information:  Adult Children Patient Interpreter Needed:  None Criminal Activity/Legal Involvement Pertinent to Current Situation/Hospitalization:  No - Comment as needed Significant Relationships:  Adult Children Lives with:  Adult Children Do you feel safe going back to the place where you live?  Yes Need for family participation in patient care:  No (Coment)  Care giving concerns:  N/A   Facilities manager / plan:  CSW met the pt at the bedside. CSW introduced self and purpose of the visit. CSW discussed SNF rehab. CSW explained the SNF process. CSW explained insurance and its relation to SNF placement. CSW provided the pt a SNF list. CSW and pt discussed geographic location in which the pt would like to receive rehab. CSW answered all questions in which the pt inquired about. CSW provided pt with contact information for further questions. CSW will continue to follow this pt and assist with discharge as needed.   PT Recommendations:  [place] / Referral to community resources:  [place]  Patient/Family's Response to care: The pt reported being comfortable and cared for.   Patient/Family's Understanding of and Emotional Response to Diagnosis, Current Treatment, and Prognosis:  Although the pt was pleasant she refused to discuss her prognosis.   Emotional Assessment Appearance:  Appears stated age Attitude/Demeanor/Rapport:   (Pleasant) Affect (typically observed):  Pleasant Orientation:  Oriented to Self, Oriented to Place, Oriented to  Time, Oriented to Situation Alcohol / Substance use:  Not Applicable Psych involvement (Current  and /or in the community):  No (Comment)  Discharge Needs  Concerns to be addressed:  Adjustment to Illness Barriers to Discharge:  No Barriers Identified  [place], MSW, [place]

## 2014-06-29 NOTE — Progress Notes (Signed)
Progress Note  Alexa Stephens UXL:[id number] DOB: September 24, 1950 DOA: 06/23/2014 PCP: [name], DO  Admit HPI / Brief Narrative: 64 y.o. female with history of stage 4 small cell lung cancer, chronic resp failure on 3-4L O2 at home, COPD, HTN, and anxietywho presented with acute on chronic resp failure w/ hypoxia. Pt reported the acute onset of cough over a few days with progressive SOB. Actively receiving chemotherapy for small cell lung cancer. Followed by pulm outpt and [facility].   In the ER T 98.4 heart rate in the 110s to 120s, respirations 20s, blood pressure in the 120s to 160s. Satting in the mid 90s with nonrebreather. White blood cell count 7.1, hemoglobin 11.4, creatinine 0.36. Chest x-ray noted mild left basilar airspace opacity concerning for pneumonia.   HPI/Subjective: She is sitting in the chair with 5 liters of [place] oxygen, in no distress. Could not wean her down.   Assessment/Plan:  Acute on chronic hypoxemic and hypercarbic respiratory failure Due to HCAP in setting of stage 4 lung CA - we couldn ot wean her oxygen down. She remains on 5lit of [place] oxygen.   Not much movement of air in the lungs.   Left and right basilar HCAP Continue empiric antibiotic therapy - plan to complete the course of the antibiotics.   COPD without evidence of exacerbation No wheezing heard.   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 Will talk to her and her family about getting a palliative care consult. She remains on the decadron for brain mets.   Abdominal distention with pain Constipation noted on CT abdomen but no new findings appreciated with some  evidence of decreased size of hepatic metastases - her constipation is resolved with the bowel regimen she is on right now.   HTN Better controlled.   Sinus tachycardia Improved, probably from work of breathing.   Hyperglycemia Likely steroid induced - A1c is 7.4   Leukocytosis: Probably from the steroids. Monitor in am.    Hyperkalemia: kayexalate ordered. And 12 lead EKG ordered.   Code Status: NO CODE BLUE Family Communication: no family present at time of exam Disposition Plan: SNF when bed available.  Consultants: PCCM  Procedures: None  Antibiotics: Vancomycin 4/26 > Zosyn 4/26 >  DVT prophylaxis: Subcutaneous heparin  Objective: Blood pressure 156/81, pulse 102, temperature 97.6 F (36.4 C), temperature source Oral, resp. rate 24, height '5\' 1"'$  (1.549 m), weight 70.1 kg (154 lb 8.7 oz), SpO2 95 %.  Intake/Output Summary (Last 24 hours) at 06/29/14 1825 Last data filed at 06/29/14 1300  Gross per 24 hour  Intake    360 ml  Output   1150 ml  Net   -790 ml   Exam: General: No acute respiratory distress - reports feeling the same.  Lungs: Poor air movement throughout without active wheeze. Cardiovascular: Distant heart sounds but regular rate without murmur gallop or rub Abdomen: no tenderness , nondistended, soft, bowel sounds positive, no rebound, no ascites,  Extremities: No significant cyanosis, clubbing, or edema bilateral lower extremities  Data Reviewed: Basic Metabolic Panel:  Recent Labs Lab 06/24/14 0245 06/25/14 0315 06/25/14 2121 06/27/14 0241 06/29/14 1130  NA 136 137 137 139 137  K 4.3 4.3 4.0 4.8 5.6*  CL 96 95* 96 96 88*  CO2 34* 37* 34* 35* 39*  GLUCOSE 255* 246* 300* 132* 187*  BUN '21 8 11 15 13  '$ CREATININE 0.35* 0.38* 0.50 0.34* 0.46  CALCIUM 8.0* 8.7 8.5 8.4 9.0    Liver Function Tests:  Recent Labs Lab 06/23/14 2353 06/24/14 0245 06/25/14 0315 06/25/14 2121 06/27/14 0241  AST '29 22 21 19 25  '$ ALT 51* 46* 39* 40*  39*  ALKPHOS 149* 133* 83 97 113  BILITOT 0.5 0.2* 0.4 0.3 0.6  PROT 6.3 5.3* 4.8* 5.5* 5.1*  ALBUMIN 3.5 2.9* 2.3* 2.5* 2.3*   CBC:  Recent Labs Lab 06/23/14 2353 06/24/14 0245 06/25/14 0315 06/27/14 0241 06/29/14 1130  WBC 7.1 3.2* 2.5* 14.7* 35.9*  NEUTROABS 4.2 1.7  --   --   --   HGB 11.4* 9.9* 8.5* 8.1* 9.6*  HCT 35.6* 31.4* 26.0* 25.2* 29.3*  MCV 101.1* 100.3* 96.7 101.2* 99.7  PLT 176 147* 211 161 165   Studies:   Recent x-ray studies have been reviewed in detail by the Attending Physician  Scheduled Meds:  Scheduled Meds: . dexamethasone  4 mg Oral BID  . heparin  5,000 Units Subcutaneous 3 times per day  . insulin aspart  0-5 Units Subcutaneous QHS  . insulin aspart  0-9 Units Subcutaneous TID WC  . ipratropium-albuterol  3 mL Inhalation Q4H  . metoprolol tartrate  25 mg Oral BID  . pantoprazole  40 mg Oral Daily  . piperacillin-tazobactam (ZOSYN)  IV  3.375 g Intravenous Q8H  . polyethylene glycol  17 g Oral BID  . salmeterol  1 puff Inhalation BID  . senna-docusate  1 tablet Oral BID  . simvastatin  20 mg Oral Daily  . sodium polystyrene  30 g Oral Once  . vancomycin  1,000 mg Intravenous Q24H    Time spent on care of this patient: 25 minutes   Jennifr Gaeta , MD   [facility] [phone]  On-Call/Text Page:      [name]      password [email or web address]  If 7PM-7AM, please contact night-coverage [email or web address] Password [email or web address] 06/29/2014, 6:25 PM   LOS: 5 days

## 2014-06-29 NOTE — Clinical Social Work Placement (Signed)
   CLINICAL SOCIAL WORK PLACEMENT  NOTE  Date:  06/29/2014  Patient Details  Name: LAMIJA BESSE MRN: [id number] Date of Birth: [birth year]  Clinical Social Work is seeking post-discharge placement for this patient at the [place] level of care (*CSW will initial, date and re-position this form in  chart as items are completed):  Yes   Patient/family provided with [facility] Work Department's list of facilities offering this level of care within the geographic area requested by the patient (or if unable, by the patient's family).  Yes   Patient/family informed of their freedom to choose among providers that offer the needed level of care, that participate in Medicare, Medicaid or managed care program needed by the patient, have an available bed and are willing to accept the patient.  Yes   Patient/family informed of [facility]'s ownership interest in [facility] and [facility], as well as of the fact that they are under no obligation to receive care at these facilities.  PASRR submitted to EDS on 06/29/14     PASRR number received on 06/29/14     Existing PASRR number confirmed on       FL2 transmitted to all facilities in geographic area requested by pt/family on       FL2 transmitted to all facilities within larger geographic area on       Patient informed that his/her managed care company has contracts with or will negotiate with certain facilities, including the following:            Patient/family informed of bed offers received.  Patient chooses bed at       Physician recommends and patient chooses bed at      Patient to be transferred to   on  .  Patient to be transferred to facility by       Patient family notified on   of transfer.  Name of family member notified:        PHYSICIAN       Additional Comment:    _______________________________________________ Greta Doom, MSW, [place]

## 2014-06-29 NOTE — Evaluation (Addendum)
Occupational Therapy Evaluation Patient Details Name: Alexa Stephens MRN: [id number] DOB: 10-Jun-1950 Today's Date: 06/29/2014    History of Present Illness 64 y.o. female with history of stage 4 small cell lung cancer, chronic resp failure on 3-4L O2 at home, COPD, HTN, and anxietywho presented with acute on chronic resp failure w/ hypoxia. Pt reported the acute onset of cough over a few days with progressive SOB. Chest x-ray noted mild left basilar airspace opacity concerning for pneumonia.    Clinical Impression   Pt admitted with above. Unsure of pt's prior level of functioning. Feel pt will benefit from acute OT to increase independence and activity tolerance prior to d/c. Recommending SNF for d/c.     Follow Up Recommendations  SNF;Supervision/Assistance - 24 hour    Equipment Recommendations  Other (comment) (defer to next venue)    Recommendations for Other Services       Precautions / Restrictions Precautions Precautions: Fall Precaution Comments: monitor O2 Restrictions Weight Bearing Restrictions: No      Mobility Bed Mobility Overal bed mobility: Needs Assistance Bed Mobility: Rolling;Sidelying to Sit;Sit to Sidelying Rolling: Min assist (able to complete towards beginning of session without physical assist, then assist given towards end of session for rolling).  Sidelying to sit: Min assist (assist with trunk)     Sit to sidelying: Supervision General bed mobility comments: cues for technique to help scoot HOB and assist given. Cues for bed mobility.  Transfers Overall transfer level: Needs assistance Equipment used: 1 person hand held assist Transfers: Sit to/from Stand Sit to Stand: Min assist                   ADL Overall ADL's : Needs assistance/impaired                     Lower Body Dressing: Maximal assistance;Sit to/from stand   Toilet Transfer:  (Min assist for sit to stand from bed)   Toileting- Clothing Manipulation and  Hygiene: Minimal assistance;Bed level;Moderate assistance       Functional mobility during ADLs:  (not assessed) General ADL Comments: Pt sat EOB and reported she did not feel well and felt as she was not getting enough air, so this limited session. Cues for deep breathing.  Pt on bedpan when OT arrived so assisted in cleaning pt-she was able to assist some as well.      Vision     Perception     Praxis      Pertinent Vitals/Pain Pain Assessment: No/denies pain; O2 in 80's to 90's with pt on around 5L of O2. Cues for deep breathing.     Hand Dominance     Extremity/Trunk Assessment Upper Extremity Assessment Upper Extremity Assessment: Generalized weakness   Lower Extremity Assessment Lower Extremity Assessment: Defer to PT evaluation       Communication Communication Communication: No difficulties   Cognition Arousal/Alertness: Awake/alert Behavior During Therapy: Anxious Overall Cognitive Status: No family/caregiver present to determine baseline cognitive functioning (not oriented to place or time)                     General Comments       Exercises       Shoulder Instructions      Home Living Family/patient expects to be discharged to:: Private residence Living Arrangements: Children (lives with son who works) Available Help at Discharge: Family;Available PRN/intermittently (states her son works)  Additional Comments: Pt is a poor historian and has difficulty answering questions. Often unsure of her answer being correct. States she lives with a son who works.      Prior Functioning/Environment Level of Independence:  (unsure)        Comments: States she uses a rollator for mobility, although pt giving poor discription of PLOF    OT Diagnosis: Generalized weakness;Cognitive deficits   OT Problem List: Decreased strength;Decreased activity tolerance;Decreased cognition;Decreased knowledge of use of DME  or AE;Decreased knowledge of precautions;Cardiopulmonary status limiting activity   OT Treatment/Interventions: Self-care/ADL training;DME and/or AE instruction;Energy conservation;Therapeutic activities;Cognitive remediation/compensation;Patient/family education;Balance training;Therapeutic exercise    OT Goals(Current goals can be found in the care plan section) Acute Rehab OT Goals Patient Stated Goal: go home OT Goal Formulation: With patient Time For Goal Achievement: 07/13/14 Potential to Achieve Goals: Good ADL Goals Pt Will Perform Upper Body Bathing: with set-up;sitting Pt Will Perform Lower Body Bathing: with min assist;sit to/from stand Pt Will Perform Lower Body Dressing: with min assist;sit to/from stand Pt Will Transfer to Toilet: with supervision;ambulating;bedside commode Pt Will Perform Toileting - Clothing Manipulation and hygiene: sit to/from stand;with min assist  OT Frequency: Min 2X/week   Barriers to D/C:            Co-evaluation              End of Session Equipment Utilized During Treatment: Gait belt;Oxygen  Activity Tolerance: Patient limited by anxiety/feeling as she was not getting enough air. Patient left: in bed;with call bell/phone within reach;with bed alarm set   Time: 0569-7948 OT Time Calculation (min): 13 min Charges:  OT General Charges $OT Visit: 1 Procedure OT Evaluation $Initial OT Evaluation Tier I: 1 Procedure G-CodesBenito Mccreedy OTR/L [phone] 06/29/2014, 10:16 AM

## 2014-06-30 ENCOUNTER — Telehealth: Payer: Self-pay | Admitting: *Deleted

## 2014-06-30 ENCOUNTER — Ambulatory Visit: Payer: Medicare Other

## 2014-06-30 ENCOUNTER — Encounter: Payer: Self-pay | Admitting: Internal Medicine

## 2014-06-30 DIAGNOSIS — Z7189 Other specified counseling: Secondary | ICD-10-CM | POA: Insufficient documentation

## 2014-06-30 DIAGNOSIS — R531 Weakness: Secondary | ICD-10-CM | POA: Insufficient documentation

## 2014-06-30 DIAGNOSIS — C3400 Malignant neoplasm of unspecified main bronchus: Secondary | ICD-10-CM | POA: Insufficient documentation

## 2014-06-30 LAB — CBC
HEMATOCRIT: 30.5 % — AB (ref 36.0–46.0)
Hemoglobin: 9.6 g/dL — ABNORMAL LOW (ref 12.0–15.0)
MCH: 31.7 pg (ref 26.0–34.0)
MCHC: 31.5 g/dL (ref 30.0–36.0)
MCV: 100.7 fL — ABNORMAL HIGH (ref 78.0–100.0)
PLATELETS: 138 10*3/uL — AB (ref 150–400)
RBC: 3.03 MIL/uL — ABNORMAL LOW (ref 3.87–5.11)
RDW: 20.5 % — ABNORMAL HIGH (ref 11.5–15.5)
WBC: 30.9 10*3/uL — ABNORMAL HIGH (ref 4.0–10.5)

## 2014-06-30 LAB — BASIC METABOLIC PANEL
Anion gap: 11 (ref 5–15)
BUN: 13 mg/dL (ref 6–20)
CHLORIDE: 89 mmol/L — AB (ref 101–111)
CO2: 39 mmol/L — ABNORMAL HIGH (ref 22–32)
Calcium: 8.4 mg/dL — ABNORMAL LOW (ref 8.9–10.3)
Creatinine, Ser: 0.41 mg/dL — ABNORMAL LOW (ref 0.44–1.00)
GLUCOSE: 227 mg/dL — AB (ref 70–99)
POTASSIUM: 4.1 mmol/L (ref 3.5–5.1)
SODIUM: 139 mmol/L (ref 135–145)

## 2014-06-30 LAB — GLUCOSE, CAPILLARY
GLUCOSE-CAPILLARY: 274 mg/dL — AB (ref 70–99)
GLUCOSE-CAPILLARY: 276 mg/dL — AB (ref 70–99)
Glucose-Capillary: 149 mg/dL — ABNORMAL HIGH (ref 70–99)
Glucose-Capillary: 207 mg/dL — ABNORMAL HIGH (ref 70–99)

## 2014-06-30 LAB — CULTURE, BLOOD (ROUTINE X 2)
Culture: NO GROWTH
Culture: NO GROWTH

## 2014-06-30 MED ORDER — IPRATROPIUM-ALBUTEROL 0.5-2.5 (3) MG/3ML IN SOLN
3.0000 mL | Freq: Three times a day (TID) | RESPIRATORY_TRACT | Status: DC
  Administered 2014-06-30 – 2014-07-06 (×15): 3 mL via RESPIRATORY_TRACT
  Filled 2014-06-30 (×17): qty 3
  Filled 2014-06-30: qty 30

## 2014-06-30 NOTE — Care Management Note (Signed)
Case Management Note  Patient Details  Name: Alexa Stephens MRN: [id number] Date of Birth: 28-Apr-1950  Subjective/Objective:                    Action/Plan:   Expected Discharge Date:  07/03/14               Expected Discharge Plan:     In-House Referral:     Discharge planning Services     Post Acute Care Choice:    Choice offered to:     DME Arranged:    DME Agency:     HH Arranged:    [place] Agency:     Status of Service:     Medicare Important Message Given:    Date Medicare IM Given:  06/30/14 Medicare IM give by:  Magdalen Spatz RN BSN  Date Additional Medicare IM Given:    Additional Medicare Important Message give by:     If discussed at [place] of Stay Meetings, dates discussed:    Additional Comments:  Marilu Favre, RN 06/30/2014, 8:17 AM

## 2014-06-30 NOTE — Progress Notes (Signed)
ANTIBIOTIC CONSULT NOTE - FOLLOW UP  Pharmacy Consult for Vancomycin + Zosyn Indication: pneumonia  Allergies  Allergen Reactions  . Benadryl [Diphenhydramine]     hyperactive  . Compazine [Prochlorperazine Edisylate] Other (See Comments)    Didn't work for her  . Excedrin Extra Strength [Aspirin-Acetaminophen-Caffeine] Other (See Comments)    "kidney aches"  . Flexeril [Cyclobenzaprine] Other (See Comments)    SKIN NUMB  . Lexapro [Escitalopram] Other (See Comments)    Gi upset  . Naproxen Other (See Comments)    High BP  . Skelaxin [Metaxalone] Other (See Comments)    DIDN'T WORK  . Tylenol [Acetaminophen] Other (See Comments)    Difficulty breathing    Patient Measurements: Height: '5\' 1"'$  (154.9 cm) Weight: 154 lb 8.7 oz (70.1 kg) IBW/kg (Calculated) : 47.8  Vital Signs: Temp: 98.3 F (36.8 C) (05/03 1358) Temp Source: Oral (05/03 1358) BP: 164/77 mmHg (05/03 1358) Pulse Rate: 96 (05/03 1358) Intake/Output from previous day: 05/02 0701 - 05/03 0700 In: 950 [P.O.:600; IV Piggyback:350] Out: 750 [Urine:750] Intake/Output from this shift: Total I/O In: 120 [P.O.:120] Out: -   Labs:  Recent Labs  06/29/14 1130 06/30/14 0532  WBC 35.9* 30.9*  HGB 9.6* 9.6*  PLT 165 138*  CREATININE 0.46 0.41*   Estimated Creatinine Clearance: 62.8 mL/min (by C-G formula based on Cr of 0.41). No results for input(s): VANCOTROUGH, VANCOPEAK, VANCORANDOM, GENTTROUGH, GENTPEAK, GENTRANDOM, TOBRATROUGH, TOBRAPEAK, TOBRARND, AMIKACINPEAK, AMIKACINTROU, AMIKACIN in the last 72 hours.   Microbiology: Recent Results (from the past 720 hour(s))  Culture, blood (routine x 2)     Status: None   Collection Time: 06/23/14 11:40 PM  Result Value Ref Range Status   Specimen Description BLOOD RIGHT ANTECUBITAL  Final   Special Requests BOTTLES DRAWN AEROBIC AND ANAEROBIC 5CC EACH  Final   Culture   Final    NO GROWTH 5 DAYS Performed at [facility]    Report Status  06/30/2014 FINAL  Final  Culture, blood (routine x 2)     Status: None   Collection Time: 06/23/14 11:50 PM  Result Value Ref Range Status   Specimen Description BLOOD RIGHT WRIST  Final   Special Requests BOTTLES DRAWN AEROBIC AND ANAEROBIC 5CC EACH  Final   Culture   Final    NO GROWTH 5 DAYS Performed at [facility]    Report Status 06/30/2014 FINAL  Final  Urine culture     Status: None   Collection Time: 06/24/14  2:08 AM  Result Value Ref Range Status   Specimen Description URINE, CLEAN CATCH  Final   Special Requests NONE  Final   Colony Count   Final    70,000 COLONIES/ML Performed at [facility]    Culture   Final    Multiple bacterial morphotypes present, none predominant. Suggest appropriate recollection if clinically indicated. Performed at [facility]    Report Status 06/26/2014 FINAL  Final  MRSA PCR Screening     Status: Abnormal   Collection Time: 06/24/14  2:05 PM  Result Value Ref Range Status   MRSA by PCR POSITIVE (A) NEGATIVE Final    Comment:        The GeneXpert MRSA Assay (FDA approved for NASAL specimens only), is one component of a comprehensive MRSA colonization surveillance program. It is not intended to diagnose MRSA infection nor to guide or monitor treatment for MRSA infections. RESULT CALLED TO, READ BACK BY AND VERIFIED WITH: A. BARLOW RN 17:00 06/24/14 (  [email or web address])       Assessment: 64 yo f admitted for SOB.  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 On vanc/zosyn day # 7 for HCAP.  Vancomycin dose changed from 500 q12h to 1 gm q24 on Sat 4/30, 4th dose will be Wed 5/4 morning early. Afebrile.  Creat 0.41, WBC up to 30.9 on decadron,   5/1 CXR: mild LLL infiltrate, no change  Vanc 4/27>> Zosyn 4/27>> Azith x 1 4/27 Rocephin x 1 4/27  4/27 Ucx F 4/26 BC x2 ng F   Goal of Therapy:  Vancomycin trough level 15-20 mcg/ml  Plan:  Continue vancomycin 1,000 mg IV  q24h Check vancomycin trough Wed 5/4 Continue Zosyn 3.375 gm IV q8h (4 hr infusion) Monitor cx, renal fx, CBC, clinical course  Eudelia Bunch, Pharm.D. [phone] 06/30/2014 2:47 PM

## 2014-06-30 NOTE — Progress Notes (Signed)
Inpatient Diabetes Program Recommendations  AACE/ADA: New Consensus Statement on Inpatient Glycemic Control (2013)  Target Ranges:  Prepandial:   less than 140 mg/dL      Peak postprandial:   less than 180 mg/dL (1-2 hours)      Critically ill patients:  140 - 180 mg/dL   Results for RILEIGH, KAWASHIMA (MRN [id number]) as of 06/30/2014 13:30  Ref. Range 06/29/2014 12:21 06/29/2014 17:43 06/29/2014 21:28 06/30/2014 07:49 06/30/2014 12:04  Glucose-Capillary Latest Ref Range: 70-99 mg/dL 190 (H) 226 (H) 269 (H) 274 (H) 149 (H)   May benefit from addition of Levemir 10 units QHS.  Will continue to follow. Thank you. Lorenda Peck, RD, LDN, CDE Inpatient Diabetes Coordinator [phone]

## 2014-06-30 NOTE — Consult Note (Signed)
Patient VE:[facility] BOSTON      DOB: 12-Nov-1950      OBS:[id number]     Consult Note from the Palliative Medicine Team at [facility] Requested by:     PCP: [name], DO Reason for Consultation              Phone                                                                                                                                [phone]  Assessment of patients Current state  Continued physical, functional and cognitive decline 2/2 to metastatic lung cancer to bone , brain and liver and its treatments and side effects.  Patient and family faced with advanced directive and treatment decisions and anticipatory care needs  Consult is for introduction to the concept of Palliative Medicine,  clarification of Advanced Directives,  holistic support and symptom management as indicated  This NP Wadie Lessen reviewed medical records, received report from team, assessed the patient and then meet with patient and her son Dione Petron  in the outpatient oncology clinic.   A detailed discussion was had today regarding advanced directives.  Concept specific to code status was detailed The difference between a aggressive medical intervention path  and a palliative comfort care path for this patient at this time was had.  Values and goals of care important to patient and family were attempted to be elicited.  Concept of Hospice and Palliative Care were discussed   Questions and concerns addressed.  Hard Choices booklet left for review. Family encouraged to call with questions or concerns.  PMT will continue to support holistically.     Goals of Care: 1.  Code Status:  DNR/DNI   2. Scope of Treatment:  At this time patient is open to all available and offered medicial interventions to prolong quality life. Hopeful to continue chemotherapy under Dr [name] and evaluate response on future CT scans   4. Symptom Management:   1. Fatigue/Weakness: -Pace yourself -Plan  your day -Include naps and breaks -schedule a relaxing day -get a little exercise -fuel the body -consider complementary therapies   -deep breathing   -prayer/medication   -guided meditation Home PT/OT referral   5. Psychosocial:  Emotional support offered to patient, main concerns   Patient Documents Completed or Given: Document Given Completed  Advanced Directives Pkt    MOST X   DNR    Gone from My Sight    Hard Choices X     Brief HPI:  DIAGNOSIS: Extensive stage (T1a, N2, M1b) small cell lung cancer diagnosed in February 2016.  PRIOR THERAPY: None  CURRENT THERAPY: Systemic chemotherapy with carboplatin for AUC of 5 on day 1 and etoposide 100 MG/M2 on days 1, 2 and 3 with Neulasta support on day 4. Status post 2 cycles.    ROS: weakness, fatigue, confusion and HA  PMH:  Past Medical History  Diagnosis Date  . Anxiety disorder   . Asthma   . COPD (chronic obstructive pulmonary disease)     Not on home O2 yet but is prescribed.  . Headache(784.0)   . Hyperlipemia   . Hypertension   . Pneumonia Sept 2013  . Sepsis Sept 2013  . Cancer     lung cancer      PSH: Past Surgical History  Procedure Laterality Date  . Cosmetic surgery    . Cystectomy    . Hand surgery     I have reviewed the FH and SH and  If appropriate update it with new information. Allergies  Allergen Reactions  . Benadryl [Diphenhydramine]     hyperactive  . Compazine [Prochlorperazine Edisylate] Other (See Comments)    Didn't work for her  . Excedrin Extra Strength [Aspirin-Acetaminophen-Caffeine] Other (See Comments)    "kidney aches"  . Flexeril [Cyclobenzaprine] Other (See Comments)    SKIN NUMB  . Lexapro [Escitalopram] Other (See Comments)    Gi upset  . Naproxen Other (See Comments)    High BP  . Skelaxin [Metaxalone] Other (See Comments)    DIDN'T WORK  . Tylenol [Acetaminophen] Other (See Comments)    Difficulty breathing   Scheduled Meds: Continuous  Infusions: PRN Meds:.    There were no vitals taken for this visit.    No intake or output data in the 24 hours ending 06/30/14 1336  Physical Exam:  General: Chronically ill appearing, in wheelchair, weak  HEENT:  moist buccal membranes, no exudate Neuro:moves all four extremities  Labs: CBC    Component Value Date/Time   WBC 30.9* 06/30/2014 0532   WBC 26.8* 06/22/2014 1608   RBC 3.03* 06/30/2014 0532   RBC 3.23* 06/22/2014 1608   HGB 9.6* 06/30/2014 0532   HGB 10.1* 06/22/2014 1608   HCT 30.5* 06/30/2014 0532   HCT 32.1* 06/22/2014 1608   PLT 138* 06/30/2014 0532   PLT 226 06/22/2014 1608   MCV 100.7* 06/30/2014 0532   MCV 99.5 06/22/2014 1608   MCH 31.7 06/30/2014 0532   MCH 31.3 06/22/2014 1608   MCHC 31.5 06/30/2014 0532   MCHC 31.5 06/22/2014 1608   RDW 20.5* 06/30/2014 0532   RDW 20.5* 06/22/2014 1608   LYMPHSABS 1.4 06/24/2014 0245   LYMPHSABS 0.9 06/22/2014 1608   MONOABS 0.1 06/24/2014 0245   MONOABS 1.0* 06/22/2014 1608   EOSABS 0.0 06/24/2014 0245   EOSABS 0.0 06/22/2014 1608   BASOSABS 0.0 06/24/2014 0245   BASOSABS 0.0 06/22/2014 1608    BMET    Component Value Date/Time   NA 139 06/30/2014 0532   NA 141 06/22/2014 1608   K 4.1 06/30/2014 0532   K 4.3 06/22/2014 1608   CL 89* 06/30/2014 0532   CO2 39* 06/30/2014 0532   CO2 34* 06/22/2014 1608   GLUCOSE 227* 06/30/2014 0532   GLUCOSE 137 06/22/2014 1608   BUN 13 06/30/2014 0532   BUN 19.6 06/22/2014 1608   CREATININE 0.41* 06/30/2014 0532   CREATININE 0.5* 06/22/2014 1608   CREATININE 0.45* 04/02/2014 1642   CALCIUM 8.4* 06/30/2014 0532   CALCIUM 8.6 06/22/2014 1608   GFRNONAA >60 06/30/2014 0532   GFRNONAA >89 02/03/2014 1457   GFRAA >60 06/30/2014 0532   GFRAA >89 02/03/2014 1457    CMP     Component Value Date/Time   NA 139 06/30/2014 0532   NA 141 06/22/2014 1608   K 4.1 06/30/2014 0532  K 4.3 06/22/2014 1608   CL 89* 06/30/2014 0532   CO2 39* 06/30/2014 0532   CO2  34* 06/22/2014 1608   GLUCOSE 227* 06/30/2014 0532   GLUCOSE 137 06/22/2014 1608   BUN 13 06/30/2014 0532   BUN 19.6 06/22/2014 1608   CREATININE 0.41* 06/30/2014 0532   CREATININE 0.5* 06/22/2014 1608   CREATININE 0.45* 04/02/2014 1642   CALCIUM 8.4* 06/30/2014 0532   CALCIUM 8.6 06/22/2014 1608   PROT 5.1* 06/27/2014 0241   PROT 6.1* 06/22/2014 1608   ALBUMIN 2.3* 06/27/2014 0241   ALBUMIN 3.4* 06/22/2014 1608   AST 25 06/27/2014 0241   AST 17 06/22/2014 1608   ALT 39* 06/27/2014 0241   ALT 47 06/22/2014 1608   ALKPHOS 113 06/27/2014 0241   ALKPHOS 219* 06/22/2014 1608   BILITOT 0.6 06/27/2014 0241   BILITOT 0.30 06/22/2014 1608   GFRNONAA >60 06/30/2014 0532   GFRNONAA >89 02/03/2014 1457   GFRAA >60 06/30/2014 0532   GFRAA >89 02/03/2014 1457   ECOG PERFORMANCE STATUS* (Eastern Cooperative Oncology Group)  0 Fully active, able to continue with all pre-disease activities without restriction. Pt score  1 Restricted in physically strenuous activity but ambulatory and able to carry out work of a light or sedentary nature, e.g., light house work, office work.   2 Ambulatory and capable of all self-care but unable to carry out any work activities. Up and about more than 50% of waking hours.    3 Capable of only limited self-care. Confined to bed or chair more than 50% of waking hours. 3  4 Completely disabled. Cannot carry on any self-care. Totally confined to bed or chair.   5 Dead.    As published in Am. J. Clin. Oncol.: Eustace Pen, M.M., Colon Flattery., [place], D.C., [place], Sharen Hint., Drexel Iha, P.P.: Toxicity And Response Criteria Of The [facility] Group. [place] 3:532-992, 1982.  The ECOG Performance Status is in the public domain therefore available for public use. To duplicate the scale, please cite the reference above and credit the [facility], Tyler Pita M.D., Group Chair    Time In Time Out Total Time  Spent with Patient Total Overall Time  1100 1215 75 min 75 min    Greater than 50%  of this time was spent counseling and coordinating care related to the above assessment and plan. Encouraged to call with questions or concerns  Wadie Lessen NP  Palliative Medicine Team Team Phone # [phone] Pager [phone]  Discussed with Joaquim Lai

## 2014-06-30 NOTE — Progress Notes (Signed)
Progress Note  Alexa Stephens IOX:[id number] DOB: March 02, 1950 DOA: 06/23/2014 PCP: Alexa Pacas, DO  Admit HPI / Brief Narrative: 64 y.o. female with history of stage 4 small cell lung cancer, chronic resp failure on 3-4L O2 at home, COPD, HTN, and anxietywho presented with acute on chronic resp failure w/ hypoxia. Pt reported the acute onset of cough over a few days with progressive SOB. Actively receiving chemotherapy for small cell lung cancer. Followed by pulm outpt and [facility].   In the ER T 98.4 heart rate in the 110s to 120s, respirations 20s, blood pressure in the 120s to 160s. Satting in the mid 90s with nonrebreather. White blood cell count 7.1, hemoglobin 11.4, creatinine 0.36. Chest x-ray noted mild left basilar airspace opacity concerning for pneumonia.   HPI/Subjective: She in bed with 5 liters of [place] oxygen, in no distress. Could not wean her oxygen. She reports she doesn't want to get out of bed. She agreed to have the palliative care discussion today. Reports she feels the same as yesterday. She appears exhausted.   Assessment/Plan:  Acute on chronic hypoxemic and hypercarbic respiratory failure Due to HCAP in setting of stage 4 lung CA - we couldn ot wean her oxygen down. She remains on 5lit of [place] oxygen.   Not much movement of air in the lungs. Discussed with her about goals of care, with long term goals and short term goals,she agreed to speak with palliative care , hopefully involve her son too.   Left and right basilar HCAP Continue empiric antibiotic therapy - plan to complete the course of the antibiotics.   COPD without evidence of exacerbation No wheezing heard.   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  She remains on the decadron for brain mets.   Abdominal distention with pain Constipation noted on CT abdomen but no new findings appreciated with some evidence of decreased size of hepatic metastases - her constipation is resolved with the bowel regimen she is on right now.   HTN Better controlled.   Sinus tachycardia Improved, probably from work of breathing.   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 CBG (last 3)   Recent Labs  06/30/14 0749 06/30/14 1204 06/30/14 1719  GLUCAP 274* 149* 207*    She remains on SSI.    Leukocytosis: Probably from the steroids. Improving.   Hyperkalemia: kayexalate ordered.and her repeat K is normal.   Code Status: NO CODE BLUE Family Communication: no family present at time of exam Disposition Plan: SNF when bed available.  Consultants: PCCM  Procedures: None  Antibiotics: Vancomycin 4/26 > Zosyn 4/26 >  DVT prophylaxis: Subcutaneous heparin  Objective: Blood pressure 164/77, pulse 96, temperature 98.3 F (36.8 C), temperature source Oral, resp. rate 18, height '5\' 1"'$  (1.549 m), weight 70.1 kg (154 lb 8.7 oz), SpO2 94 %.  Intake/Output Summary (Last 24 hours) at 06/30/14 1803 Last data filed at 06/30/14 0949  Gross per 24 hour  Intake    710 ml  Output      0 ml  Net    710 ml   Exam: General: No acute respiratory distress - reports feeling the  same.  Lungs: Poor air movement throughout without active wheeze. Cardiovascular: Distant heart sounds but regular rate without murmur gallop or rub Abdomen: no tenderness , nondistended, soft, bowel sounds positive, no rebound, no ascites,  Extremities: No significant cyanosis, clubbing, or edema bilateral lower extremities  Data Reviewed: Basic Metabolic Panel:  Recent Labs Lab 06/25/14 0315 06/25/14 2121 06/27/14 0241 06/29/14 1130 06/30/14 0532  NA 137 137  139 137 139  K 4.3 4.0 4.8 5.6* 4.1  CL 95* 96 96 88* 89*  CO2 37* 34* 35* 39* 39*  GLUCOSE 246* 300* 132* 187* 227*  BUN '8 11 15 13 13  '$ CREATININE 0.38* 0.50 0.34* 0.46 0.41*  CALCIUM 8.7 8.5 8.4 9.0 8.4*    Liver Function Tests:  Recent Labs Lab 06/23/14 2353 06/24/14 0245 06/25/14 0315 06/25/14 2121 06/27/14 0241  AST '29 22 21 19 25  '$ ALT 51* 46* 39* 40* 39*  ALKPHOS 149* 133* 83 97 113  BILITOT 0.5 0.2* 0.4 0.3 0.6  PROT 6.3 5.3* 4.8* 5.5* 5.1*  ALBUMIN 3.5 2.9* 2.3* 2.5* 2.3*   CBC:  Recent Labs Lab 06/23/14 2353 06/24/14 0245 06/25/14 0315 06/27/14 0241 06/29/14 1130 06/30/14 0532  WBC 7.1 3.2* 2.5* 14.7* 35.9* 30.9*  NEUTROABS 4.2 1.7  --   --   --   --   HGB 11.4* 9.9* 8.5* 8.1* 9.6* 9.6*  HCT 35.6* 31.4* 26.0* 25.2* 29.3* 30.5*  MCV 101.1* 100.3* 96.7 101.2* 99.7 100.7*  PLT 176 147* 211 161 165 138*   Studies:   Recent x-ray studies have been reviewed in detail by the Attending Physician  Scheduled Meds:  Scheduled Meds: . dexamethasone  4 mg Oral BID  . heparin  5,000 Units Subcutaneous 3 times per day  . insulin aspart  0-5 Units Subcutaneous QHS  . insulin aspart  0-9 Units Subcutaneous TID WC  . ipratropium-albuterol  3 mL Inhalation TID  . metoprolol tartrate  25 mg Oral BID  . pantoprazole  40 mg Oral Daily  . piperacillin-tazobactam (ZOSYN)  IV  3.375 g Intravenous Q8H  . polyethylene glycol  17 g Oral BID  . salmeterol  1 puff Inhalation BID  . senna-docusate  1 tablet Oral BID  . simvastatin  20 mg Oral Daily  . vancomycin  1,000 mg Intravenous Q24H    Time spent on care of this patient: 25 minutes   Alexa Stephens , MD   [facility] [phone]  On-Call/Text Page:      [email or web address]      password [email or web address]  If 7PM-7AM, please contact night-coverage [email or web address] Password [email or web address] 06/30/2014, 6:03 PM   LOS: 6 days

## 2014-06-30 NOTE — Progress Notes (Signed)
   06/30/14 1500  Clinical Encounter Type  Visited With Patient  Visit Type Initial;Psychological support;Spiritual support  Referral From Palliative care team  Stress Factors  Patient Stress Factors Exhausted   Chaplain was referred to patient via spiritual care consult from palliative care team. Chaplain visited with patient briefly this afternoon. Patient appeared very tired and did not want to speak much. Patient articulated that her only concern at this point is getting enough oxygen. Patient said her family has been supportive but mostly they aren't available to be with her until the evenings when they are off work. Chaplain offered various ways of supporting her through this time and the patient thought prayer was a good idea for today. Chaplain prayed with patient. Chaplain will continue to provide emotional and spiritual support for patient as needed.  Gar Ponto, Chaplain  3:55 PM

## 2014-06-30 NOTE — Telephone Encounter (Signed)
Call to Social Worker,  discussed Md's responses, no further questions at this time.

## 2014-06-30 NOTE — Progress Notes (Signed)
I called and spoke with Alexa Stephens to advise fmla forms are ready. He wants a copy mailed and ok to fax  [phone].

## 2014-06-30 NOTE — Telephone Encounter (Signed)
Message on vm from [place], who is working on placement for pt to SNF.  1. Will pt's PAC remain in patient or be removed prior to SNF placement? 2.  Will patient continue on Chemotherapy IV or PO and or Radiation while in SNF? How often will treatments be? 3.  Will pt have any injections during stay in SNF?  Questions forward to MD for review.

## 2014-06-30 NOTE — Telephone Encounter (Signed)
PAC needs to stay in for future chemo unless the patient and her family made a decision to proceed with hospice and no further treatment. No injection ordered from [place] at [facility].

## 2014-07-01 ENCOUNTER — Encounter (HOSPITAL_COMMUNITY): Payer: Self-pay | Admitting: Radiology

## 2014-07-01 ENCOUNTER — Inpatient Hospital Stay (HOSPITAL_COMMUNITY): Payer: Medicare Other

## 2014-07-01 ENCOUNTER — Ambulatory Visit: Payer: Medicare Other

## 2014-07-01 DIAGNOSIS — Z515 Encounter for palliative care: Secondary | ICD-10-CM

## 2014-07-01 DIAGNOSIS — R0600 Dyspnea, unspecified: Secondary | ICD-10-CM

## 2014-07-01 DIAGNOSIS — R531 Weakness: Secondary | ICD-10-CM

## 2014-07-01 LAB — GLUCOSE, CAPILLARY
GLUCOSE-CAPILLARY: 156 mg/dL — AB (ref 70–99)
GLUCOSE-CAPILLARY: 234 mg/dL — AB (ref 70–99)
Glucose-Capillary: 240 mg/dL — ABNORMAL HIGH (ref 70–99)
Glucose-Capillary: 160 mg/dL — ABNORMAL HIGH (ref 70–99)

## 2014-07-01 LAB — VANCOMYCIN, TROUGH

## 2014-07-01 MED ORDER — VANCOMYCIN HCL IN DEXTROSE 1-5 GM/200ML-% IV SOLN
1000.0000 mg | Freq: Two times a day (BID) | INTRAVENOUS | Status: AC
  Administered 2014-07-01 – 2014-07-03 (×5): 1000 mg via INTRAVENOUS
  Filled 2014-07-01 (×6): qty 200

## 2014-07-01 MED ORDER — MORPHINE SULFATE (CONCENTRATE) 10 MG/0.5ML PO SOLN
5.0000 mg | ORAL | Status: DC | PRN
  Administered 2014-07-01 – 2014-07-06 (×4): 5 mg via ORAL
  Filled 2014-07-01 (×4): qty 0.5

## 2014-07-01 MED ORDER — FUROSEMIDE 10 MG/ML IJ SOLN
40.0000 mg | Freq: Two times a day (BID) | INTRAMUSCULAR | Status: AC
  Administered 2014-07-01 – 2014-07-02 (×3): 40 mg via INTRAVENOUS
  Filled 2014-07-01 (×3): qty 4

## 2014-07-01 MED ORDER — IOHEXOL 300 MG/ML  SOLN
25.0000 mL | INTRAMUSCULAR | Status: AC
  Administered 2014-07-01 (×2): 25 mL via ORAL

## 2014-07-01 MED ORDER — IOHEXOL 300 MG/ML  SOLN
100.0000 mL | Freq: Once | INTRAMUSCULAR | Status: AC | PRN
  Administered 2014-07-01: 100 mL via INTRAVENOUS

## 2014-07-01 NOTE — Progress Notes (Signed)
ANTIBIOTIC CONSULT NOTE - FOLLOW UP  Pharmacy Consult for Vancomycin + Zosyn Indication: pneumonia  Allergies  Allergen Reactions  . Benadryl [Diphenhydramine]     hyperactive  . Compazine [Prochlorperazine Edisylate] Other (See Comments)    Didn't work for her  . Excedrin Extra Strength [Aspirin-Acetaminophen-Caffeine] Other (See Comments)    "kidney aches"  . Flexeril [Cyclobenzaprine] Other (See Comments)    SKIN NUMB  . Lexapro [Escitalopram] Other (See Comments)    Gi upset  . Naproxen Other (See Comments)    High BP  . Skelaxin [Metaxalone] Other (See Comments)    DIDN'T WORK  . Tylenol [Acetaminophen] Other (See Comments)    Difficulty breathing    Patient Measurements: Height: '5\' 1"'$  (154.9 cm) Weight: 154 lb 8.7 oz (70.1 kg) IBW/kg (Calculated) : 47.8  Vital Signs: Temp: 98.3 F (36.8 C) (05/04 0507) Temp Source: Oral (05/04 0507) BP: 147/85 mmHg (05/04 0507) Pulse Rate: 101 (05/04 0507) Intake/Output from previous day: 05/03 0701 - 05/04 0700 In: 170 [P.O.:120; IV Piggyback:50] Out: -  Intake/Output from this shift:    Labs:  Recent Labs  06/29/14 1130 06/30/14 0532  WBC 35.9* 30.9*  HGB 9.6* 9.6*  PLT 165 138*  CREATININE 0.46 0.41*   Estimated Creatinine Clearance: 62.8 mL/min (by C-G formula based on Cr of 0.41).  Recent Labs  07/01/14 0525  [place] <4*     Microbiology: Recent Results (from the past 720 hour(s))  Culture, blood (routine x 2)     Status: None   Collection Time: 06/23/14 11:40 PM  Result Value Ref Range Status   Specimen Description BLOOD RIGHT ANTECUBITAL  Final   Special Requests BOTTLES DRAWN AEROBIC AND ANAEROBIC 5CC EACH  Final   Culture   Final    NO GROWTH 5 DAYS Performed at [facility]    Report Status 06/30/2014 FINAL  Final  Culture, blood (routine x 2)     Status: None   Collection Time: 06/23/14 11:50 PM  Result Value Ref Range Status   Specimen Description BLOOD RIGHT WRIST  Final    Special Requests BOTTLES DRAWN AEROBIC AND ANAEROBIC 5CC EACH  Final   Culture   Final    NO GROWTH 5 DAYS Performed at [facility]    Report Status 06/30/2014 FINAL  Final  Urine culture     Status: None   Collection Time: 06/24/14  2:08 AM  Result Value Ref Range Status   Specimen Description URINE, CLEAN CATCH  Final   Special Requests NONE  Final   Colony Count   Final    70,000 COLONIES/ML Performed at [facility]    Culture   Final    Multiple bacterial morphotypes present, none predominant. Suggest appropriate recollection if clinically indicated. Performed at [facility]    Report Status 06/26/2014 FINAL  Final  MRSA PCR Screening     Status: Abnormal   Collection Time: 06/24/14  2:05 PM  Result Value Ref Range Status   MRSA by PCR POSITIVE (A) NEGATIVE Final    Comment:        The GeneXpert MRSA Assay (FDA approved for NASAL specimens only), is one component of a comprehensive MRSA colonization surveillance program. It is not intended to diagnose MRSA infection nor to guide or monitor treatment for MRSA infections. RESULT CALLED TO, READ BACK BY AND VERIFIED WITH: A. BARLOW RN 17:00 06/24/14 ([email or web address])       Assessment: 64 yo f admitted for SOB.  Patient  is actively on chemo for stage IV SCLC with mets to brain, bone and liver. On vanc/zosyn day # 7 for HCAP.  Vancomycin dose changed from 500 q12h to 1 gm q24 on Sat 4/30, 4th dose will be Wed 5/4 morning early. Afebrile.  Creat 0.41, WBC up to 30.9 on decadron,   5/1 CXR: mild LLL infiltrate, no change  5/4 Vancomycin trough < 4 at 0525 am on 1 gm q24, trough below goal.   Vanc 4/27>> Zosyn 4/27>> Azith x 1 4/27 Rocephin x 1 4/27  4/27 Ucx F 4/26 BC x2 ng F   Goal of Therapy:  Vancomycin trough level 15-20 mcg/ml  Plan:  increase vancomycin 1,000 mg IV q24h to vancomycin 1 gm q12h Continue Zosyn 3.375 gm IV q8h (4 hr infusion) Monitor cx, renal fx, CBC, clinical  course  Eudelia Bunch, Pharm.D. [phone] 07/01/2014 9:09 AM

## 2014-07-01 NOTE — Progress Notes (Signed)
[facility]                                                                              Patient Demographics  Alexa Stephens, is a 64 y.o. female, DOB - Oct 07, 1950, EOF:[id number]  Admit date - 06/23/2014   Admitting Physician [name], MD  Outpatient Primary MD for the patient is [name], DO  LOS - 7   Chief Complaint  Patient presents with  . Shortness of Breath       Brief HPI   64 y.o. female with history of stage 4 small cell lung cancer, chronic resp failure on 3-4L O2 at home, COPD, HTN, and anxietywho presented with acute on chronic resp failure with hypoxia. Pt reported the acute onset of cough over a few days with progressive SOB. Actively receiving chemotherapy for small cell lung cancer. Followed by pulm outpt and [facility], Dr. [name].In the ER T 98.4 heart rate in the 110s to 120s, respirations 20s, blood pressure in the 120s to 160s. Satting in the mid 90s with nonrebreather. White blood cell count 7.1, hemoglobin 11.4, creatinine 0.36. Chest x-ray noted mild left basilar airspace opacity concerning for pneumonia.    Assessment & Plan    Principal Problem:   Acute on chronic respiratory failure with hypoxia likely due to left lung pneumonia in the setting of lung cancer, stage IV - Continue O2 via nasal cannula - Long discussion with patient's family and palliative medicine, they requested to obtain CT of the chest, abdomen and pelvis, MRI of the brain as was recommended by Dr. [name]. She was to have these imaging on Friday, 4/29 however patient was admitted on 4/27. Patient's son requested not to make any decisions regarding palliative care until the imaging results are available if there is any hope for chemotherapy or further treatment. 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 [name] last week but patient was unable to do so due to her admission - Patient's son requested no further decisions regarding palliative care until the imaging results. Will discuss with Dr. [name] after the imaging results.   Code Status: DO NOT RESUSCITATE  Family Communication: Discussed in detail with the patient, all imaging results, lab results explained to the patient and patient's family including her son.     Disposition Plan: Not medically ready   Time Spent in minutes  25  minutes  Procedures  MRI brain  Consults   none  DVT Prophylaxis  heparin subcutaneous  Medications  Scheduled Meds: . dexamethasone  4 mg Oral BID  . furosemide  40 mg Intravenous Q12H  . heparin  5,000 Units Subcutaneous 3 times per day  . insulin aspart  0-5 Units Subcutaneous QHS  . insulin aspart  0-9 Units Subcutaneous TID WC  . ipratropium-albuterol  3 mL Inhalation TID  . metoprolol tartrate  25 mg Oral BID  . pantoprazole  40 mg Oral Daily  . piperacillin-tazobactam (ZOSYN)  IV  3.375 g Intravenous Q8H  . polyethylene glycol  17 g Oral BID  . salmeterol  1 puff Inhalation BID  . senna-docusate  1 tablet Oral BID  . simvastatin  20 mg Oral Daily  . vancomycin  1,000 mg Intravenous Q12H   Continuous Infusions:  PRN Meds:.LORazepam, morphine CONCENTRATE, oxyCODONE   Antibiotics   Anti-infectives    Start     Dose/Rate Route Frequency Ordered Stop   07/01/14 2000  vancomycin (VANCOCIN) IVPB 1000 mg/200 mL premix      1,000 mg 200 mL/hr over 60 Minutes Intravenous Every 12 hours 07/01/14 0912     06/28/14 0526  vancomycin (VANCOCIN) IVPB 1000 mg/200 mL premix  Status:  Discontinued     1,000 mg 200 mL/hr over 60 Minutes Intravenous Every 24 hours 06/27/14 1316 07/01/14 0912   06/24/14 1200  piperacillin-tazobactam (ZOSYN) IVPB 3.375 g     3.375 g 12.5 mL/hr over 240 Minutes Intravenous Every 8 hours 06/24/14 0530     06/24/14 0600  vancomycin (VANCOCIN) 500 mg in sodium chloride 0.9 % 100 mL IVPB  Status:  Discontinued     500 mg 100 mL/hr over 60 Minutes Intravenous Every 12 hours 06/24/14 0530 06/27/14 1316   06/24/14 0545  piperacillin-tazobactam (ZOSYN) IVPB 3.375 g     3.375 g 100 mL/hr over 30 Minutes Intravenous  Once 06/24/14 0530 06/24/14 0646   06/24/14 0215  cefTRIAXone (ROCEPHIN) 1 g in dextrose 5 % 50 mL IVPB  Status:  Discontinued     1 g 100 mL/hr over 30 Minutes Intravenous Every 24 hours 06/24/14 0202 06/24/14 0514   06/24/14 0215  azithromycin (ZITHROMAX) 500 mg in dextrose 5 % 250 mL IVPB  Status:  Discontinued     500 mg 250 mL/hr over 60 Minutes Intravenous Every 24 hours 06/24/14 0202 06/24/14 0514   06/24/14 0045  cefTRIAXone (ROCEPHIN) 1 g in dextrose 5 % 50 mL IVPB     1 g 100 mL/hr over 30 Minutes Intravenous  Once 06/24/14 0042 06/24/14 0214   06/24/14 0045  azithromycin (ZITHROMAX) 500 mg in dextrose 5 % 250 mL IVPB     500 mg 250 mL/hr over 60 Minutes Intravenous  Once 06/24/14 0042 06/24/14 0214        Subjective:   Ruchi Stoney was seen and examined today. Patient denies dizziness, chest pain, new weakness, numbess, tingling. Feels significantly short of breath, abdominal distention, afebrile.  Objective:   Blood pressure 147/85, pulse 101, temperature 98.3 F (36.8 C), temperature source Oral, resp. rate 18, height '5\' 1"'$  (1.549 m), weight 70.1 kg (154 lb 8.7 oz), SpO2 98 %.  Wt Readings from Last 3 Encounters:  06/27/14 70.1 kg (154 lb 8.7 oz)  06/01/14  46.947 kg (103 lb 8 oz)  05/14/14 45.813 kg (101 lb)    No intake or output data in the 24 hours ending 07/01/14 1410  Exam  General: Alert and oriented x 3, NAD  HEENT:  PERRLA, EOMI, Anicteic Sclera, mucous membranes moist.   Neck:  Supple, no JVD, no masses  CVS: S1 S2 auscultated,tachycardia, no MRG.  Respiratory:Decreased breath sounds at the bases, no rhonchi   Abdomen: Soft, nontender, distended, + bowel sounds  Ext: no cyanosis clubbing or edema  Neuro: AAOx3, Cr N's II- XII. Strength 5/5 upper and lower extremities bilaterally  Skin: No rashes  Psych: Normal affect and demeanor, alert and oriented x3    Data Review   Micro Results Recent Results (from the past 240 hour(s))  Culture, blood (routine x 2)     Status: None   Collection Time: 06/23/14 11:40 PM  Result Value Ref Range Status   Specimen Description BLOOD RIGHT ANTECUBITAL  Final   Special Requests BOTTLES DRAWN AEROBIC AND ANAEROBIC 5CC EACH  Final   Culture   Final    NO GROWTH 5 DAYS Performed at [facility]    Report Status 06/30/2014 FINAL  Final  Culture, blood (routine x 2)     Status: None   Collection Time: 06/23/14 11:50 PM  Result Value Ref Range Status   Specimen Description BLOOD RIGHT WRIST  Final   Special Requests BOTTLES DRAWN AEROBIC AND ANAEROBIC 5CC EACH  Final   Culture   Final    NO GROWTH 5 DAYS Performed at [facility]    Report Status 06/30/2014 FINAL  Final  Urine culture     Status: None   Collection Time: 06/24/14  2:08 AM  Result Value Ref Range Status   Specimen Description URINE, CLEAN CATCH  Final   Special Requests NONE  Final   Colony Count   Final    70,000 COLONIES/ML Performed at [facility]    Culture   Final    Multiple bacterial morphotypes present, none predominant. Suggest appropriate recollection if clinically indicated. Performed at [facility]    Report Status 06/26/2014 FINAL  Final  MRSA PCR Screening      Status: Abnormal   Collection Time: 06/24/14  2:05 PM  Result Value Ref Range Status   MRSA by PCR POSITIVE (A) NEGATIVE Final    Comment:        The GeneXpert MRSA Assay (FDA approved for NASAL specimens only), is one component of a comprehensive MRSA colonization surveillance program. It is not intended to diagnose MRSA infection nor to guide or monitor treatment for MRSA infections. RESULT CALLED TO, READ BACK BY AND VERIFIED WITH: A. BARLOW RN 17:00 06/24/14 ([email or web address])     Radiology Reports Mr Brain Wo Contrast  07/01/2014   CLINICAL DATA:  History of stage IV small-cell lung cancer with brain metastases. Admitted for hypoxic respiratory failure.  EXAM: MRI HEAD WITHOUT CONTRAST  TECHNIQUE: Multiplanar, multiecho pulse sequences of the brain and surrounding structures were obtained without intravenous contrast.  COMPARISON:  04/24/2014  FINDINGS: Calvarium and upper cervical spine: No visible skeletal metastases.  Orbits: Bilateral cataract resection. No orbital mass or acute finding.  Sinuses: Anterior nasal septal perforation. There is mucosal thickening and effusions which have developed bilaterally in the mastoid cavities. No sinus fluid level.  Brain: Known brain metastases are not clearly visualized due to lack of intravenous contrast. Previously seen edema around metastases in the bilateral cerebellum has resolved (patient is on chemotherapy and steroids). There is no evidence of interval hemorrhage. Advanced chronic small vessel disease with ischemic gliosis throughout the bilateral cerebral white matter and diffusely in the pons. Punctate diffusion hyperintensity below the mid right sylvian fissure is chronic compared to the previous study and represents a  metastasis. The diffusion signal abnormality is decreased in size, of indeterminate clinical significance. A faint punctate diffusion hyperintensity in the left frontal white matter does not correlate with previously seen  enhancing foci and is indeterminate.  No convincing infarct. No hemorrhage, hydrocephalus, or evidence of major vessel occlusion.  IMPRESSION: 1. No acute intracranial findings. 2. Favorable resolution of FLAIR signal abnormality at bilateral cerebellar metastases. Cannot re-stage brain metastases without intravenous contrast. 3. Bilateral mastoiditis, new from February 2016.   Electronically Signed   By: Monte Fantasia M.D.   On: 07/01/2014 11:55   Ct Abdomen Pelvis W Contrast  06/24/2014   CLINICAL DATA:  Metastatic lung cancer. New onset of abdominal distention. Increasing shortness of breath.  EXAM: CT ABDOMEN AND PELVIS WITH CONTRAST  TECHNIQUE: Multidetector CT imaging of the abdomen and pelvis was performed using the standard protocol following bolus administration of intravenous contrast.  CONTRAST:  80 cc Omnipaque 300  COMPARISON:  Multiple exams, including 04/30/2014  FINDINGS: Lower chest: Consolidation of much of the left lower lobe and parts of the posterior basal segment right lower lobe concerning for pneumonia or pulmonary hemorrhage. Severe emphysema. Mild distal esophageal wall thickening.  Hepatobiliary: Hypodense metastatic lesions along the falciform ligament and inferiorly in the right hepatic lobe. Index lesion along the falciform ligament measures approximately 2.5 by 1.8 cm. No new liver lesions are identified. The gallbladder is mildly distended. No biliary dilatation observed.  Pancreas: Unremarkable  Spleen: Unremarkable  Adrenals/Urinary Tract: Unremarkable  Stomach/Bowel: There is prominence of stool in the proximal half of the colon but not the distal half. Sigmoid colon diverticulosis. Appendix unremarkable.  Vascular/Lymphatic: Aortoiliac atherosclerotic vascular disease.  Reproductive: Unremarkable  Other: No supplemental non-categorized findings.  Musculoskeletal: Dextroconvex rotary scoliosis. The patient has known osseous metastatic disease for example in the left femur  greater trochanter. Bony demineralization is present.  IMPRESSION: 1. Consolidation and much of the left lower lobe and in the posterior basal segment right lower lobe, favoring pneumonia or pulmonary hemorrhage. 2. Hepatic masses compatible with hepatic metastatic disease is, reduced in size over the past 2 months. 3. The patient has known osseous metastatic disease to the left proximal femur. 4. Mild prominence of stool in the proximal half of the colon without obstruction identified. This could reflect low grade constipation. 5. Severe emphysema. 6. Mild distal esophageal wall thickening -esophagitis not excluded. 7.  Aortoiliac atherosclerotic vascular disease.   Electronically Signed   By: Van Clines M.D.   On: 06/24/2014 07:37   Dg Chest Port 1 View  06/28/2014   CLINICAL DATA:  Acute on chronic respiratory failure with hypoxia. Healthcare associated pneumonia. Metastatic lung carcinoma.  EXAM: PORTABLE CHEST - 1 VIEW  COMPARISON:  06/25/2014  FINDINGS: Right-sided power port remains in appropriate position. Left lower lung infiltrate shows no significant change. Right lung remains clear. Heart size is within normal limits. 6C  IMPRESSION: Mild left lower lobe infiltrate, without significant change.   Electronically Signed   By: Earle Gell M.D.   On: 06/28/2014 09:25   Dg Chest Port 1 View  06/25/2014   CLINICAL DATA:  Shortness of breath.  History of lung cancer.  EXAM: PORTABLE CHEST - 1 VIEW  COMPARISON:  CT chest 04/30/2014. Plain film of the chest 06/24/2014.  FINDINGS: The lungs are emphysematous. Left basilar airspace disease seen on yesterday's examination has improved. The right lung appears clear. Heart size is normal. Port-A-Cath is in place.  IMPRESSION: Improved left basilar airspace disease.  Emphysema.  Electronically Signed   By: Inge Rise M.D.   On: 06/25/2014 07:19   Dg Chest Port 1 View  06/24/2014   CLINICAL DATA:  Acute onset of shortness of breath. Initial  encounter.  EXAM: PORTABLE CHEST - 1 VIEW  COMPARISON:  PET/CT performed 04/30/2014, and CT of the chest performed 04/03/2014  FINDINGS: The lungs are well-aerated. Milder left basilar airspace opacity raises concern for pneumonia. There is no evidence of pleural effusion or pneumothorax.  Known metastatic disease is not well characterized on radiograph. The cardiomediastinal silhouette is within normal limits. No acute osseous abnormalities are seen. A right-sided chest port is noted ending about the distal SVC.  IMPRESSION: Mild left basilar airspace opacity raises concern for pneumonia.   Electronically Signed   By: Garald Balding M.D.   On: 06/24/2014 00:57    CBC  Recent Labs Lab 06/25/14 0315 06/27/14 0241 06/29/14 1130 06/30/14 0532  WBC 2.5* 14.7* 35.9* 30.9*  HGB 8.5* 8.1* 9.6* 9.6*  HCT 26.0* 25.2* 29.3* 30.5*  PLT 211 161 165 138*  MCV 96.7 101.2* 99.7 100.7*  MCH 31.6 32.5 32.7 31.7  MCHC 32.7 32.1 32.8 31.5  RDW 19.5* 19.5* 19.9* 20.5*    Chemistries   Recent Labs Lab 06/25/14 0315 06/25/14 2121 06/27/14 0241 06/29/14 1130 06/30/14 0532  NA 137 137 139 137 139  K 4.3 4.0 4.8 5.6* 4.1  CL 95* 96 96 88* 89*  CO2 37* 34* 35* 39* 39*  GLUCOSE 246* 300* 132* 187* 227*  BUN '8 11 15 13 13  '$ CREATININE 0.38* 0.50 0.34* 0.46 0.41*  CALCIUM 8.7 8.5 8.4 9.0 8.4*  AST '21 19 25  '$ --   --   ALT 39* 40* 39*  --   --   ALKPHOS 83 97 113  --   --   BILITOT 0.4 0.3 0.6  --   --    ------------------------------------------------------------------------------------------------------------------ estimated creatinine clearance is 62.8 mL/min (by C-G formula based on Cr of 0.41). ------------------------------------------------------------------------------------------------------------------ No results for input(s): HGBA1C in the last 72 hours. ------------------------------------------------------------------------------------------------------------------ No results for  input(s): CHOL, HDL, LDLCALC, TRIG, CHOLHDL, LDLDIRECT in the last 72 hours. ------------------------------------------------------------------------------------------------------------------ No results for input(s): TSH, T4TOTAL, T3FREE, THYROIDAB in the last 72 hours.  Invalid input(s): FREET3 ------------------------------------------------------------------------------------------------------------------ No results for input(s): VITAMINB12, FOLATE, FERRITIN, TIBC, IRON, RETICCTPCT in the last 72 hours.  Coagulation profile No results for input(s): INR, PROTIME in the last 168 hours.  No results for input(s): DDIMER in the last 72 hours.  Cardiac Enzymes No results for input(s): CKMB, TROPONINI, MYOGLOBIN in the last 168 hours.  Invalid input(s): CK ------------------------------------------------------------------------------------------------------------------ Invalid input(s): [place]  06/30/14 0749 06/30/14 1204 06/30/14 1719 06/30/14 2217 07/01/14 0814 07/01/14 1244  GLUCAP 274* 149* 207* 276* 240* 156*     Zephaniah Lubrano M.D. [facility] 07/01/2014, 2:10 PM  Pager: [phone]   Between 7am to 7pm - call Pager - [phone]  After 7pm go to [email or web address] - password [email or web address]  Call night coverage person covering after 7pm

## 2014-07-01 NOTE — Progress Notes (Signed)
Pt c/o increased SOB.  O2 sat = 92 on 4LPM [place].  O2 inc to 5LPM.  RT notified to give neb treatment.  Pt given Ativan for anxiety.  Will monitor closely.

## 2014-07-01 NOTE — Consult Note (Signed)
Consultation Note Date: 07/01/2014   Patient Name: Alexa Stephens  DOB: Jul 27, 1950  MRN: [id number]  Age / Sex: 64 y.o., female   PCP: [name], DO Referring Physician: Mendel Corning, MD  Reason for Consultation: Disposition, Establishing goals of care, Non pain symptom management, Pain control and Psychosocial/spiritual support  Palliative Care Assessment and Plan Summary of Established Goals of Care and Medical Treatment Preferences    Palliative Care Discussion Held Today:   This NP Wadie Lessen reviewed medical records, received report from team, assessed the patient and then meet at the patient's bedside along with her son [name] and his SO Casimer Bilis  to discuss diagnosis prognosis, [place], EOL wishes disposition and options.   A detailed discussion was had today regarding advanced directives.  Concepts specific to code status, artifical feeding and hydration, continued IV antibiotics and rehospitalization was had.  The difference between a aggressive medical intervention path  and a palliative comfort care path for this patient at this time was had.  Values and goals of care important to patient and family were attempted to be elicited.  Concept of Hospice and Palliative Care were discussed  Natural trajectory and expectations at EOL were discussed.  Questions and concerns addressed.  Hard Choices booklet left for review. Family encouraged to call with questions or concerns.  PMT will continue to support holistically.  At this time family is requesting more diagnostic information to help with decesion making regarding continued chemotherapy vs comfort care.    Discussed with Dr Tana Coast need for  Head/Abd CT today for diagnostic purposes.  Will need continued conversation and support navigating treatment options and disposition options.     Contacts/Participants in Discussion: Primary Decision Maker: patient and her son [name]  HCPOA: yes    Code Status/Advance Care  Planning:  DNR/DNI  Symptom Management:   Dyspnea:  Roxanol 5 mg po/sl every 2 hrs prn  Psycho-social/Spiritual:   Support System:  Son [name] and his SO Casimer Bilis  Desire for further Chaplaincy support:yes  Prognosis: < 6 months  Discharge Planning:  Pending       Chief Complaint/History of Present Illness: weakness and dyspnea  Chief Complaint: acute on chronic resp failure w/ hypoxia, CAP History of Present Illness:This is a 64 y.o. year old female with significant past medical history of stage 4 small cell lung cancer, chronic resp failure on 3-4L at home, COPD, HTN, anxiety presenting with acute on chronic resp failure w/ hypoxia, COPD. Pt w/ acute onset of worsening cough over the past days. Progressive SOB. No fevers or chills. No nausea or vomiting. Wears 3-4 L at home chronically. Has been compliant w/ this. Non smoker. Actively receiving chemotherapy for small cell lung cancer. Followed by pulm outpt and [facility]. Pt also w/ worsening abd distension and pain per family.  Presents to the ER T 98.4 heart rate in the 110s to 120s, respirations and 20s, blood pressure in the 120s to 160s. Satting in the mid and 90s with nonrebreather at 57%. White blood cell count 7.1, hemoglobin 11.4, creatinine 0.36. Chest x-ray shows mild left basilar airspace opacity concerning for pneumonia. Started on Rocephin and azithromycin empirically. Assessment and Plan: CORALEIGH Stephens is a 18 y.o. year old female presenting with acute on chronic resp failure w/ hypoxia, CAP Active Problems:  Acute on chronic respiratory failure with hypoxia  Abdominal distension  Primary Diagnoses  Present on Admission:  . Acute on chronic respiratory failure with hypoxia . Abdominal distension .  HCAP (healthcare-associated pneumonia) . Respiratory acidosis . Metastatic lung cancer (metastasis from lung to other site) . Brain metastases . Metastases to the liver . Bone metastases .  Essential hypertension . Sinus tachycardia  Palliative Review of Systems: weakness, dyspnea, intermittent confustion  -weakness, dyspnea, weight loss I have reviewed the medical record, interviewed the patient and family, and examined the patient. The following aspects are pertinent.  Past Medical History  Diagnosis Date  . Anxiety disorder   . Asthma   . COPD (chronic obstructive pulmonary disease)     Not on home O2 yet but is prescribed.  . Headache(784.0)   . Hyperlipemia   . Hypertension   . Pneumonia Sept 2013  . Sepsis Sept 2013  . Cancer     lung cancer    History   Social History  . Marital Status: Single    Spouse Name: N/A  . Number of Children: N/A  . Years of Education: N/A   Occupational History  . retired    Social History Main Topics  . Smoking status: Former Smoker -- 0.02 packs/day for 40 years    Types: Cigarettes    Quit date: 12/28/2013  . Smokeless tobacco: Not on file     Comment: 1 pack per 3-4 days 06/11/13-- QUIT around November 2015  . Alcohol Use: No  . Drug Use: No  . Sexual Activity: Not on file   Other Topics Concern  . None   Social History Narrative   Family History  Problem Relation Age of Onset  . Lung cancer Maternal Grandfather   . Cancer Maternal Grandmother    Scheduled Meds: . dexamethasone  4 mg Oral BID  . furosemide  40 mg Intravenous Q12H  . heparin  5,000 Units Subcutaneous 3 times per day  . insulin aspart  0-5 Units Subcutaneous QHS  . insulin aspart  0-9 Units Subcutaneous TID WC  . ipratropium-albuterol  3 mL Inhalation TID  . metoprolol tartrate  25 mg Oral BID  . pantoprazole  40 mg Oral Daily  . piperacillin-tazobactam (ZOSYN)  IV  3.375 g Intravenous Q8H  . polyethylene glycol  17 g Oral BID  . salmeterol  1 puff Inhalation BID  . senna-docusate  1 tablet Oral BID  . simvastatin  20 mg Oral Daily  . vancomycin  1,000 mg Intravenous Q12H   Continuous Infusions:  PRN Meds:.LORazepam, morphine  CONCENTRATE, oxyCODONE Medications Prior to Admission:  Prior to Admission medications   Medication Sig Start Date End Date Taking? Authorizing Provider  albuterol (PROAIR HFA) 108 (90 BASE) MCG/ACT inhaler Inhale 2 puffs into the lungs every 6 (six) hours as needed for wheezing or shortness of breath. 05/11/14  Yes Laurie Panda, NP  albuterol (PROVENTIL) (2.5 MG/3ML) 0.083% nebulizer solution Inhale 3 mLs into the lungs every 6 (six) hours as needed for wheezing or shortness of breath.  01/07/14  Yes Historical Provider, MD  dexamethasone (DECADRON) 4 MG tablet Take 1 tablet (4 mg total) by mouth 2 (two) times daily. 06/17/14  Yes Curt Bears, MD  ipratropium (ATROVENT) 0.02 % nebulizer solution Take 2.5 mLs (500 mcg total) by nebulization every 6 (six) hours as needed (ICD10: J44.9). 12/23/13  Yes Chesley Mires, MD  Ipratropium-Albuterol (COMBIVENT RESPIMAT) 20-100 MCG/ACT AERS respimat INHALE 1 PUFF BY MOUTH EVERY 6 HOURS. 05/11/14  Yes Laurie Panda, NP  lisinopril (PRINIVIL,ZESTRIL) 20 MG tablet Take 1 tablet (20 mg total) by mouth daily. 02/03/14  Yes Sean Hommel, DO  pantoprazole (  PROTONIX) 40 MG tablet Take 1 tablet (40 mg total) by mouth daily. 04/30/14  Yes Curt Bears, MD  PRESCRIPTION MEDICATION She receives chemo treatments at the [facility] with Dr. [name]. She received Carboplatin '310mg'$  and Etoposide '130mg'$  on 04/20/14 and Etoposide '130mg'$  on 04/21/14.   Yes Historical Provider, MD  simvastatin (ZOCOR) 20 MG tablet Take 20 mg by mouth daily.   Yes Historical Provider, MD  clonazePAM (KLONOPIN) 1 MG tablet TAKE 1 TABLET BY MOUTH TWICE DAILY AS NEEDED FOR ANXIETY Patient not taking: Reported on 06/24/2014 02/23/14   [name], DO  fluconazole (DIFLUCAN) 100 MG tablet Take 1 tablet (100 mg total) by mouth daily. Patient not taking: Reported on 06/24/2014 05/13/14   Curt Bears, MD  Fluticasone Furoate-Vilanterol (BREO ELLIPTA) 100-25 MCG/INH AEPB Inhale 1 puff  into the lungs daily. Patient not taking: Reported on 06/24/2014 04/21/14   Tammy S Parrett, NP  HYDROcodone-homatropine (HYCODAN) 5-1.5 MG/5ML syrup Take 5 mLs by mouth every 6 (six) hours as needed for cough. Patient not taking: Reported on 06/24/2014 06/08/14   Curt Bears, MD  lidocaine-prilocaine (EMLA) cream Apply 1 application topically as needed. Patient not taking: Reported on 06/24/2014 05/11/14   Laurie Panda, NP  ondansetron (ZOFRAN) 8 MG tablet Take 1 tablet (8 mg total) by mouth every 8 (eight) hours as needed for nausea or vomiting. Patient not taking: Reported on 06/24/2014 04/21/14   Curt Bears, MD  oxyCODONE (OXY IR/ROXICODONE) 5 MG immediate release tablet Take 1 tablet (5 mg total) by mouth every 4 (four) hours as needed for severe pain. Patient not taking: Reported on 06/24/2014 05/06/14   Susanne Borders, NP  valACYclovir (VALTREX) 1000 MG tablet Take 1 tablet (1,000 mg total) by mouth 3 (three) times daily. Patient not taking: Reported on 06/24/2014 06/08/14   Curt Bears, MD   Allergies  Allergen Reactions  . Benadryl [Diphenhydramine]     hyperactive  . Compazine [Prochlorperazine Edisylate] Other (See Comments)    Didn't work for her  . Excedrin Extra Strength [Aspirin-Acetaminophen-Caffeine] Other (See Comments)    "kidney aches"  . Flexeril [Cyclobenzaprine] Other (See Comments)    SKIN NUMB  . Lexapro [Escitalopram] Other (See Comments)    Gi upset  . Naproxen Other (See Comments)    High BP  . Skelaxin [Metaxalone] Other (See Comments)    DIDN'T WORK  . Tylenol [Acetaminophen] Other (See Comments)    Difficulty breathing   CBC:    Component Value Date/Time   WBC 30.9* 06/30/2014 0532   WBC 26.8* 06/22/2014 1608   HGB 9.6* 06/30/2014 0532   HGB 10.1* 06/22/2014 1608   HCT 30.5* 06/30/2014 0532   HCT 32.1* 06/22/2014 1608   PLT 138* 06/30/2014 0532   PLT 226 06/22/2014 1608   MCV 100.7* 06/30/2014 0532   MCV 99.5 06/22/2014 1608    NEUTROABS 1.7 06/24/2014 0245   NEUTROABS 24.9* 06/22/2014 1608   LYMPHSABS 1.4 06/24/2014 0245   LYMPHSABS 0.9 06/22/2014 1608   MONOABS 0.1 06/24/2014 0245   MONOABS 1.0* 06/22/2014 1608   EOSABS 0.0 06/24/2014 0245   EOSABS 0.0 06/22/2014 1608   BASOSABS 0.0 06/24/2014 0245   BASOSABS 0.0 06/22/2014 1608   Comprehensive Metabolic Panel:    Component Value Date/Time   NA 139 06/30/2014 0532   NA 141 06/22/2014 1608   K 4.1 06/30/2014 0532   K 4.3 06/22/2014 1608   CL 89* 06/30/2014 0532   CO2 39* 06/30/2014 0532   CO2  34* 06/22/2014 1608   BUN 13 06/30/2014 0532   BUN 19.6 06/22/2014 1608   CREATININE 0.41* 06/30/2014 0532   CREATININE 0.5* 06/22/2014 1608   CREATININE 0.45* 04/02/2014 1642   GLUCOSE 227* 06/30/2014 0532   GLUCOSE 137 06/22/2014 1608   CALCIUM 8.4* 06/30/2014 0532   CALCIUM 8.6 06/22/2014 1608   AST 25 06/27/2014 0241   AST 17 06/22/2014 1608   ALT 39* 06/27/2014 0241   ALT 47 06/22/2014 1608   ALKPHOS 113 06/27/2014 0241   ALKPHOS 219* 06/22/2014 1608   BILITOT 0.6 06/27/2014 0241   BILITOT 0.30 06/22/2014 1608   PROT 5.1* 06/27/2014 0241   PROT 6.1* 06/22/2014 1608   ALBUMIN 2.3* 06/27/2014 0241   ALBUMIN 3.4* 06/22/2014 1608    Physical Exam: Vital Signs: BP 147/85 mmHg  Pulse 101  Temp(Src) 98.3 F (36.8 C) (Oral)  Resp 18  Ht '5\' 1"'$  (1.549 m)  Wt 70.1 kg (154 lb 8.7 oz)  BMI 29.22 kg/m2  SpO2 96% SpO2: SpO2: 96 % O2 Device: O2 Device: Nasal Cannula O2 Flow Rate: O2 Flow Rate (L/min): 5 L/min Intake/output summary: No intake or output data in the 24 hours ending 07/01/14 1621 LBM:   Baseline Weight: Weight: 52.164 kg (115 lb) Most recent weight: Weight: 70.1 kg (154 lb 8.7 oz)  Exam Findings:  General: ill appearing, dyspneic at rest HEENT: Skin: CVS Resp Extr:        Palliative Performance Scale: 30 % at best              Additional Data Reviewed: Recent Labs     06/29/14  1130  06/30/14  0532  WBC  35.9*  30.9*  HGB   9.6*  9.6*  PLT  165  138*  NA  137  139  BUN  13  13  CREATININE  0.46  0.41*     Time In: 0830 Time Out: 1000 Time Total:  90 minutes  Greater than 50%  of this time was spent counseling and coordinating care related to the above assessment and plan.  Signed by: Wadie Lessen, NP  Knox Royalty, NP  07/01/2014, 4:21 PM  Please contact Palliative Medicine Team phone at [phone] for questions and concerns.   Discussed with Dr Tana Coast

## 2014-07-02 ENCOUNTER — Ambulatory Visit: Payer: Medicare Other

## 2014-07-02 ENCOUNTER — Inpatient Hospital Stay (HOSPITAL_COMMUNITY): Payer: Medicare Other

## 2014-07-02 DIAGNOSIS — Z515 Encounter for palliative care: Secondary | ICD-10-CM

## 2014-07-02 DIAGNOSIS — Z66 Do not resuscitate: Secondary | ICD-10-CM

## 2014-07-02 DIAGNOSIS — R0600 Dyspnea, unspecified: Secondary | ICD-10-CM

## 2014-07-02 LAB — GLUCOSE, CAPILLARY
Glucose-Capillary: 215 mg/dL — ABNORMAL HIGH (ref 70–99)
Glucose-Capillary: 268 mg/dL — ABNORMAL HIGH (ref 70–99)
Glucose-Capillary: 256 mg/dL — ABNORMAL HIGH (ref 70–99)

## 2014-07-02 MED ORDER — FUROSEMIDE 10 MG/ML IJ SOLN
40.0000 mg | Freq: Two times a day (BID) | INTRAMUSCULAR | Status: DC
  Administered 2014-07-02 – 2014-07-04 (×4): 40 mg via INTRAVENOUS
  Filled 2014-07-02 (×4): qty 4

## 2014-07-02 MED ORDER — POLYETHYLENE GLYCOL 3350 17 G PO PACK: 17.0000 g | PACK | Freq: Every day | ORAL | Status: DC | PRN

## 2014-07-02 MED ORDER — GADOBENATE DIMEGLUMINE 529 MG/ML IV SOLN
15.0000 mL | Freq: Once | INTRAVENOUS | Status: AC | PRN
  Administered 2014-07-02: 15 mL via INTRAVENOUS

## 2014-07-02 NOTE — Progress Notes (Signed)
Inpatient Diabetes Program Recommendations  AACE/ADA: New Consensus Statement on Inpatient Glycemic Control (2013)  Target Ranges:  Prepandial:   less than 140 mg/dL      Peak postprandial:   less than 180 mg/dL (1-2 hours)      Critically ill patients:  140 - 180 mg/dL   Results for KEAMBER, MACFADDEN (MRN [id number]) as of 07/02/2014 14:05  Ref. Range 07/01/2014 22:18 07/02/2014 08:04 07/02/2014 13:06  Glucose-Capillary Latest Ref Range: 70-99 mg/dL 234 (H) 215 (H) 268 (H)   Blood sugars >200 mg/dL.  Consider increasing Novolog to moderate tidwc and hs.  Thank you. Lorenda Peck, RD, LDN, CDE Inpatient Diabetes Coordinator [phone]

## 2014-07-02 NOTE — Progress Notes (Signed)
OT Cancellation Note  Patient Details Name: ANAHI BELMAR MRN: [id number] DOB: [birth year]   Cancelled Treatment:    Reason Eval/Treat Not Completed: Fatigue/lethargy limiting ability to participate Attempted to see after PT. Pt too fatigued.  Will follow up tomorrow if possible.  [place], OTR/L  [phone] 07/02/2014 07/02/2014, 5:11 PM

## 2014-07-02 NOTE — Progress Notes (Signed)
Physical Therapy Treatment Patient Details Name: Alexa Stephens MRN: [id number] DOB: Jan 08, 1951 Today's Date: 07/02/2014    History of Present Illness 64 y.o. female with history of stage 4 small cell lung cancer, chronic resp failure on 3-4L O2 at home, COPD, HTN, and anxietywho presented with acute on chronic resp failure w/ hypoxia. Pt reported the acute onset of cough over a few days with progressive SOB. Chest x-ray noted mild left basilar airspace opacity concerning for pneumonia.     PT Comments    Patient with some progress towards PT goals today. Tolerated transfer training with min assist, balance training with moderate sway, and side stepping up to 5 feet. Remains very anxious upon standing. SpO2 96% on 5L supplemental O2 during training, HR noted at 130. Patient will continue to benefit from skilled physical therapy services to further improve independence with functional mobility.   Follow Up Recommendations  SNF;Supervision/Assistance - 24 hour     Equipment Recommendations   (TBD at next venue of care)    Recommendations for Other Services       Precautions / Restrictions Precautions Precautions: Fall Precaution Comments: monitor O2 Restrictions Weight Bearing Restrictions: No    Mobility  Bed Mobility Overal bed mobility: Needs Assistance Bed Mobility: Rolling;Sidelying to Sit Rolling: Supervision Sidelying to sit: Min assist       General bed mobility comments: Rolls without assist. VC for use of rail and min assist for UE to pull into seated position with use of bed pad to assist pt scoot to edge of bed.  Transfers Overall transfer level: Needs assistance Equipment used: 1 person hand held assist Transfers: Sit to/from [facility] Sit to Stand: Min assist Stand pivot transfers: Min assist       General transfer comment: Min assist for boost to stand from bed and to pivot to [facility]. Very anxious stating she cannot stand, but does. Moderate  sway noted, improves with tactile cues for support. Min assist to rise from [facility] with VC for hand placement.  Ambulation/Gait Ambulation/Gait assistance: Min assist Ambulation Distance (Feet): 5 Feet Assistive device: 1 person hand held assist Gait Pattern/deviations:  (Side steps, and backwards stepping.)     General Gait Details: Pt tolerated side stepping with hand held assist, and took several steps backwards towards chair. Mild loss of balance corrected with min assist. NO buckling noted however pt frequently stating "I'm going to fall" and "I cant stand up" while standing.   Stairs            Wheelchair Mobility    Modified Rankin (Stroke Patients Only)       Balance                                    Cognition Arousal/Alertness: Awake/alert Behavior During Therapy: Anxious Overall Cognitive Status: No family/caregiver present to determine baseline cognitive functioning                      Exercises General Exercises - Lower Extremity Ankle Circles/Pumps: AROM;Both;15 reps;Supine Quad Sets: Strengthening;Both;10 reps;Seated Gluteal Sets: Strengthening;Both;10 reps;Seated Long Arc Quad: Strengthening;Both;10 reps;Seated Hip Flexion/Marching: Strengthening;Both;10 reps;Seated Other Exercises Other Exercises: Tolerated standing balance activity x3 minutes before she became too fatigued and needed to sit.    General Comments General comments (skin integrity, edema, etc.): Pt urinated and had small BM in BSC. Practiced standing balance while performing perineal care. Pt  held IV pole for stability.      Pertinent Vitals/Pain Pain Assessment: No/denies pain    Home Living                      Prior Function            PT Goals (current goals can now be found in the care plan section) Acute Rehab PT Goals PT Goal Formulation: Patient unable to participate in goal setting Time For Goal Achievement: 07/12/14 Potential to  Achieve Goals: Fair Progress towards PT goals: Progressing toward goals    Frequency  Min 2X/week    PT Plan Current plan remains appropriate    Co-evaluation             End of Session Equipment Utilized During Treatment: Gait belt;Oxygen (5LO2 via [place]) Activity Tolerance: Patient limited by fatigue (limited by anxiety) Patient left: in chair;with call bell/phone within reach     Time: 2025-4270 PT Time Calculation (min) (ACUTE ONLY): 25 min  Charges:  $Therapeutic Exercise: 8-22 mins $Therapeutic Activity: 8-22 mins                    G Codes:      Alexa Stephens Jul 20, 2014, 4:14 PM Camille Bal [place], [place]

## 2014-07-02 NOTE — Progress Notes (Signed)
Patient had bath this am.

## 2014-07-02 NOTE — Progress Notes (Signed)
[facility]                                                                              Patient Demographics  Alexa Stephens, is a 64 y.o. female, DOB - [birth year], TLX:[id number]  Admit date - 06/23/2014   Admitting Physician [name], MD  Outpatient Primary MD for the patient is [name], DO  LOS - 8   Chief Complaint  Patient presents with  . Shortness of Breath       Brief HPI   64 y.o. female with history of stage 4 small cell lung cancer, chronic resp failure on 3-4L O2 at home, COPD, HTN, and anxietywho presented with acute on chronic resp failure with hypoxia. Pt reported the acute onset of cough over a few days with progressive SOB. Actively receiving chemotherapy for small cell lung cancer. Followed by pulm outpt and [facility], Dr. [name].In the ER T 98.4 heart rate in the 110s to 120s, respirations 20s, blood pressure in the 120s to 160s. Satting in the mid 90s with nonrebreather. White blood cell count 7.1, hemoglobin 11.4, creatinine 0.36. Chest x-ray noted mild left basilar airspace opacity concerning for pneumonia.    Assessment & Plan    Principal Problem:   Acute on chronic respiratory failure with hypoxia likely due to left lung pneumonia in the setting of lung cancer, stage IV - Continue O2 via nasal cannula - Long discussion with patient's family and palliative medicine, they requested to obtain CT of the chest, abdomen and pelvis, MRI of the brain as was recommended by Dr. [name]. She was to have these imaging on Friday, 4/29 however patient was admitted on 4/27. Patient's son requested not to make any decisions regarding palliative care until the imaging results are available if there is any hope for chemotherapy or further treatment. - Patient appears to have ascites, still 7.3L positive on I/O's, was placed on IV Lasix, still having difficulty breathing, placed on scheduled Lasix '40mg'$  q12 hrs, CT abdomen and pelvis  did not comment on ascites, will obtain abdominal ultrasound.  - Very short of breath and struggling, palliative medicine following however family wants to wait for the fall imaging results before any decisions regarding palliative care.   Active Problems:   Abdominal distension: Possibly ascites from hepatic metastasis from stage IV lung cancer -CT chest, abdomen and pelvis showed esophagitis, very small bilateral pleural effusions and pericardial effusion, hepatic metastatic deposits, metastatic deposit in the left greater trochanter.    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 MRI of the brain showed no acute intracranial findings, cannot restage brain metastasis without intravenous contrast, son requested MRI with contrast which I ordered. - Palliative medicine following    Code Status: DO NOT RESUSCITATE   Family Communication: Discussed in detail with the patient, all imaging results, lab results explained to the patient and patient's son yesterday   Disposition Plan: Not medically ready   Time Spent in minutes  25  minutes  Procedures  MRI brain  Consults   none  DVT Prophylaxis  heparin subcutaneous  Medications  Scheduled Meds: . dexamethasone  4 mg Oral BID  . furosemide  40 mg Intravenous Q12H  . heparin  5,000 Units Subcutaneous 3 times per day  . insulin aspart  0-5 Units Subcutaneous QHS  . insulin aspart  0-9 Units Subcutaneous TID WC  . ipratropium-albuterol  3 mL Inhalation TID  . metoprolol tartrate  25 mg Oral BID  . pantoprazole  40 mg Oral Daily  . piperacillin-tazobactam (ZOSYN)  IV  3.375 g Intravenous Q8H  . polyethylene glycol  17 g Oral BID  . salmeterol  1 puff Inhalation BID  . senna-docusate  1 tablet Oral BID  . simvastatin  20 mg Oral Daily  . vancomycin   1,000 mg Intravenous Q12H   Continuous Infusions:  PRN Meds:.LORazepam, morphine CONCENTRATE, oxyCODONE, polyethylene glycol   Antibiotics   Anti-infectives    Start     Dose/Rate Route Frequency Ordered Stop   07/01/14 2000  vancomycin (VANCOCIN) IVPB 1000 mg/200 mL premix     1,000 mg 200 mL/hr over 60 Minutes Intravenous Every 12 hours 07/01/14 0912     06/28/14 0526  vancomycin (VANCOCIN) IVPB 1000 mg/200 mL premix  Status:  Discontinued     1,000 mg 200 mL/hr over 60 Minutes Intravenous Every 24 hours 06/27/14 1316 07/01/14 0912   06/24/14 1200  piperacillin-tazobactam (ZOSYN) IVPB 3.375 g     3.375 g 12.5 mL/hr over 240 Minutes Intravenous Every 8 hours 06/24/14 0530     06/24/14 0600  vancomycin (VANCOCIN) 500 mg in sodium chloride 0.9 % 100 mL IVPB  Status:  Discontinued     500 mg 100 mL/hr over 60 Minutes Intravenous Every 12 hours 06/24/14 0530 06/27/14 1316   06/24/14 0545  piperacillin-tazobactam (ZOSYN) IVPB 3.375 g     3.375 g 100 mL/hr over 30 Minutes Intravenous  Once 06/24/14 0530 06/24/14 0646   06/24/14 0215  cefTRIAXone (ROCEPHIN) 1 g in dextrose 5 % 50 mL IVPB  Status:  Discontinued     1 g 100 mL/hr over 30 Minutes Intravenous Every 24 hours 06/24/14 0202 06/24/14 0514   06/24/14 0215  azithromycin (ZITHROMAX) 500 mg in dextrose 5 % 250 mL IVPB  Status:  Discontinued     500 mg 250 mL/hr over 60 Minutes Intravenous Every 24 hours 06/24/14 0202 06/24/14 0514   06/24/14 0045  cefTRIAXone (ROCEPHIN) 1 g in dextrose 5 % 50 mL IVPB     1 g 100 mL/hr over 30 Minutes Intravenous  Once 06/24/14 0042 06/24/14 0214   06/24/14 0045  azithromycin (ZITHROMAX) 500 mg in dextrose 5 % 250 mL IVPB     500 mg 250 mL/hr over 60 Minutes Intravenous  Once 06/24/14 0042 06/24/14 0214        Subjective:   Alexa Stephens was seen and examined today. Having difficulty breathing, O2 sats 94% on 5 L, denies any chest pain, nausea or vomiting, new weakness, numbness or tingling  any diarrhea. Has abdominal distention, afebrile.  Objective:   Blood pressure 117/68, pulse 106, temperature 97.7 F (36.5 C), temperature source Oral, resp. rate 20, height '5\' 1"'$  (1.549 m), weight 70.1 kg (154 lb 8.7 oz), SpO2 94 %.  Wt Readings from Last 3 Encounters:  06/27/14 70.1 kg (154 lb 8.7 oz)  06/01/14 46.947 kg (103 lb 8 oz)  05/14/14 45.813 kg (101 lb)  Intake/Output Summary (Last 24 hours) at 07/02/14 1246 Last data filed at 07/01/14 1600  Gross per 24 hour  Intake    200 ml  Output      0 ml  Net    200 ml    Exam  General: Alert and oriented x 3, NAD, uncomfortable  HEENT:  PERRLA, EOMI, Anicteic Sclera, mucous membranes moist.   Neck: Supple, no JVD, no masses  CVS: S1 S2 clear, no mrg  Respiratory:Dec BS at the bases, no rhonchi   Abdomen: Soft,NT, distended, + bowel sounds  Ext: no cyanosis clubbing or edema  Neuro: AAOx3, Cr N's II- XII. Strength 5/5 upper and lower extremities bilaterally  Skin: No rashes  Psych: Normal affect and demeanor, alert and oriented x3    Data Review   Micro Results Recent Results (from the past 240 hour(s))  Culture, blood (routine x 2)     Status: None   Collection Time: 06/23/14 11:40 PM  Result Value Ref Range Status   Specimen Description BLOOD RIGHT ANTECUBITAL  Final   Special Requests BOTTLES DRAWN AEROBIC AND ANAEROBIC 5CC EACH  Final   Culture   Final    NO GROWTH 5 DAYS Performed at [facility]    Report Status 06/30/2014 FINAL  Final  Culture, blood (routine x 2)     Status: None   Collection Time: 06/23/14 11:50 PM  Result Value Ref Range Status   Specimen Description BLOOD RIGHT WRIST  Final   Special Requests BOTTLES DRAWN AEROBIC AND ANAEROBIC 5CC EACH  Final   Culture   Final    NO GROWTH 5 DAYS Performed at [facility]    Report Status 06/30/2014 FINAL  Final  Urine culture     Status: None   Collection Time: 06/24/14  2:08 AM  Result Value Ref Range Status    Specimen Description URINE, CLEAN CATCH  Final   Special Requests NONE  Final   Colony Count   Final    70,000 COLONIES/ML Performed at [facility]    Culture   Final    Multiple bacterial morphotypes present, none predominant. Suggest appropriate recollection if clinically indicated. Performed at [facility]    Report Status 06/26/2014 FINAL  Final  MRSA PCR Screening     Status: Abnormal   Collection Time: 06/24/14  2:05 PM  Result Value Ref Range Status   MRSA by PCR POSITIVE (A) NEGATIVE Final    Comment:        The GeneXpert MRSA Assay (FDA approved for NASAL specimens only), is one component of a comprehensive MRSA colonization surveillance program. It is not intended to diagnose MRSA infection nor to guide or monitor treatment for MRSA infections. RESULT CALLED TO, READ BACK BY AND VERIFIED WITH: A. BARLOW RN 17:00 06/24/14 ([email or web address])     Radiology Reports Ct Chest W Contrast  07/01/2014   CLINICAL DATA:  Abdominal distention. History of metastatic lung carcinoma.  EXAM: CT CHEST, ABDOMEN, AND PELVIS WITH CONTRAST  TECHNIQUE: Multidetector CT imaging of the chest, abdomen and pelvis was performed following the standard protocol during bolus administration of intravenous contrast.  CONTRAST:  100 mL OMNIPAQUE IOHEXOL 300 MG/ML  SOLN  COMPARISON:  CT chest, abdomen and pelvis 04/03/2014. PET CT scan 04/29/2014.  FINDINGS: CT CHEST FINDINGS  Lymphadenopathy in the chest appears improved compared to the prior examinations. A supraclavicular node on the left which had measured 1.4 cm on the prior PET CT today measures 0.7  cm on image 4. A pretracheal node which had measured 1.0 cm on the prior PET CT scan today measures 0.9 cm on image 23. AP window node measuring 1.0 cm also on image 23 is decreased from 2.4 x 1.5 cm on the prior chest CT. No new or enlarging lymph nodes are identified.  The walls of the upper esophagus are thickened. Very small left pleural effusion  is noted. Trace amount of pleural fluid is seen on the right. There is also a trace amount of pericardial fluid. Heart size is normal.  Lungs demonstrate severe centrilobular emphysema as on prior examinations. Previously seen left lower lobe mass versus adenopathy in the left hilum is not visible on this examination. Similarly, a lobulated nodule in the posterior right lower lobe seen on the prior CT chest is no longer visible. Dependent airspace opacity in the lower lobes bilaterally likely reflects compressive atelectasis.  CT ABDOMEN AND PELVIS FINDINGS  Hepatic metastatic deposit which had measured 3.7 x 5.1 cm on the prior PET CT scan today measures 1.8 x 2.0 cm on image 58. A second lesion in the right hepatic lobe which had measured 3.7 x 3.2 cm today measures 1.6 x 1.8 cm on image 63. A third lesion in the inferior right hepatic lobe which had measured 2.9 x 2.8 cm today measures 1.4 x 1.4 cm on image 62. No new or enlarging liver lesions are identified.  The adrenal glands, spleen, pancreas and kidneys all appear normal. The gallbladder is unremarkable. Uterus, adnexa and urinary bladder appear normal. Sigmoid diverticulosis without diverticulitis is identified. Prominent stool burden in the colon is noted. The stomach, small bowel and appendix appear normal. Aortoiliac atherosclerosis without aneurysm is identified.  Metastatic deposit in the left greater trochanter is seen with small areas of cortical disruption along the medial margin of the greater trochanter. No other osseous metastatic disease is visible by CT scan.  IMPRESSION: Improved appearance metastatic lung carcinoma when decreased lymphadenopathy in the chest, improved hepatic metastases and nonvisualization of previously seen lung nodules. Metastatic deposit in left greater trochanter is again seen.  Thickening of the walls of the upper esophagus compatible with esophagitis.  Very small bilateral pleural effusions and pericardial effusion.   Dependent basilar atelectasis.  Extensive emphysematous disease.   Electronically Signed   By: Inge Rise M.D.   On: 07/01/2014 17:30   Mr Brain Wo Contrast  07/01/2014   CLINICAL DATA:  History of stage IV small-cell lung cancer with brain metastases. Admitted for hypoxic respiratory failure.  EXAM: MRI HEAD WITHOUT CONTRAST  TECHNIQUE: Multiplanar, multiecho pulse sequences of the brain and surrounding structures were obtained without intravenous contrast.  COMPARISON:  04/24/2014  FINDINGS: Calvarium and upper cervical spine: No visible skeletal metastases.  Orbits: Bilateral cataract resection. No orbital mass or acute finding.  Sinuses: Anterior nasal septal perforation. There is mucosal thickening and effusions which have developed bilaterally in the mastoid cavities. No sinus fluid level.  Brain: Known brain metastases are not clearly visualized due to lack of intravenous contrast. Previously seen edema around metastases in the bilateral cerebellum has resolved (patient is on chemotherapy and steroids). There is no evidence of interval hemorrhage. Advanced chronic small vessel disease with ischemic gliosis throughout the bilateral cerebral white matter and diffusely in the pons. Punctate diffusion hyperintensity below the mid right sylvian fissure is chronic compared to the previous study and represents a metastasis. The diffusion signal abnormality is decreased in size, of indeterminate clinical significance. A faint punctate  diffusion hyperintensity in the left frontal white matter does not correlate with previously seen enhancing foci and is indeterminate.  No convincing infarct. No hemorrhage, hydrocephalus, or evidence of major vessel occlusion.  IMPRESSION: 1. No acute intracranial findings. 2. Favorable resolution of FLAIR signal abnormality at bilateral cerebellar metastases. Cannot re-stage brain metastases without intravenous contrast. 3. Bilateral mastoiditis, new from February 2016.    Electronically Signed   By: Monte Fantasia M.D.   On: 07/01/2014 11:55   Ct Abdomen Pelvis W Contrast  07/01/2014   CLINICAL DATA:  Abdominal distention. History of metastatic lung carcinoma.  EXAM: CT CHEST, ABDOMEN, AND PELVIS WITH CONTRAST  TECHNIQUE: Multidetector CT imaging of the chest, abdomen and pelvis was performed following the standard protocol during bolus administration of intravenous contrast.  CONTRAST:  100 mL OMNIPAQUE IOHEXOL 300 MG/ML  SOLN  COMPARISON:  CT chest, abdomen and pelvis 04/03/2014. PET CT scan 04/29/2014.  FINDINGS: CT CHEST FINDINGS  Lymphadenopathy in the chest appears improved compared to the prior examinations. A supraclavicular node on the left which had measured 1.4 cm on the prior PET CT today measures 0.7 cm on image 4. A pretracheal node which had measured 1.0 cm on the prior PET CT scan today measures 0.9 cm on image 23. AP window node measuring 1.0 cm also on image 23 is decreased from 2.4 x 1.5 cm on the prior chest CT. No new or enlarging lymph nodes are identified.  The walls of the upper esophagus are thickened. Very small left pleural effusion is noted. Trace amount of pleural fluid is seen on the right. There is also a trace amount of pericardial fluid. Heart size is normal.  Lungs demonstrate severe centrilobular emphysema as on prior examinations. Previously seen left lower lobe mass versus adenopathy in the left hilum is not visible on this examination. Similarly, a lobulated nodule in the posterior right lower lobe seen on the prior CT chest is no longer visible. Dependent airspace opacity in the lower lobes bilaterally likely reflects compressive atelectasis.  CT ABDOMEN AND PELVIS FINDINGS  Hepatic metastatic deposit which had measured 3.7 x 5.1 cm on the prior PET CT scan today measures 1.8 x 2.0 cm on image 58. A second lesion in the right hepatic lobe which had measured 3.7 x 3.2 cm today measures 1.6 x 1.8 cm on image 63. A third lesion in the inferior  right hepatic lobe which had measured 2.9 x 2.8 cm today measures 1.4 x 1.4 cm on image 62. No new or enlarging liver lesions are identified.  The adrenal glands, spleen, pancreas and kidneys all appear normal. The gallbladder is unremarkable. Uterus, adnexa and urinary bladder appear normal. Sigmoid diverticulosis without diverticulitis is identified. Prominent stool burden in the colon is noted. The stomach, small bowel and appendix appear normal. Aortoiliac atherosclerosis without aneurysm is identified.  Metastatic deposit in the left greater trochanter is seen with small areas of cortical disruption along the medial margin of the greater trochanter. No other osseous metastatic disease is visible by CT scan.  IMPRESSION: Improved appearance metastatic lung carcinoma when decreased lymphadenopathy in the chest, improved hepatic metastases and nonvisualization of previously seen lung nodules. Metastatic deposit in left greater trochanter is again seen.  Thickening of the walls of the upper esophagus compatible with esophagitis.  Very small bilateral pleural effusions and pericardial effusion.  Dependent basilar atelectasis.  Extensive emphysematous disease.   Electronically Signed   By: Inge Rise M.D.   On: 07/01/2014 17:30  Ct Abdomen Pelvis W Contrast  06/24/2014   CLINICAL DATA:  Metastatic lung cancer. New onset of abdominal distention. Increasing shortness of breath.  EXAM: CT ABDOMEN AND PELVIS WITH CONTRAST  TECHNIQUE: Multidetector CT imaging of the abdomen and pelvis was performed using the standard protocol following bolus administration of intravenous contrast.  CONTRAST:  80 cc Omnipaque 300  COMPARISON:  Multiple exams, including 04/30/2014  FINDINGS: Lower chest: Consolidation of much of the left lower lobe and parts of the posterior basal segment right lower lobe concerning for pneumonia or pulmonary hemorrhage. Severe emphysema. Mild distal esophageal wall thickening.  Hepatobiliary:  Hypodense metastatic lesions along the falciform ligament and inferiorly in the right hepatic lobe. Index lesion along the falciform ligament measures approximately 2.5 by 1.8 cm. No new liver lesions are identified. The gallbladder is mildly distended. No biliary dilatation observed.  Pancreas: Unremarkable  Spleen: Unremarkable  Adrenals/Urinary Tract: Unremarkable  Stomach/Bowel: There is prominence of stool in the proximal half of the colon but not the distal half. Sigmoid colon diverticulosis. Appendix unremarkable.  Vascular/Lymphatic: Aortoiliac atherosclerotic vascular disease.  Reproductive: Unremarkable  Other: No supplemental non-categorized findings.  Musculoskeletal: Dextroconvex rotary scoliosis. The patient has known osseous metastatic disease for example in the left femur greater trochanter. Bony demineralization is present.  IMPRESSION: 1. Consolidation and much of the left lower lobe and in the posterior basal segment right lower lobe, favoring pneumonia or pulmonary hemorrhage. 2. Hepatic masses compatible with hepatic metastatic disease is, reduced in size over the past 2 months. 3. The patient has known osseous metastatic disease to the left proximal femur. 4. Mild prominence of stool in the proximal half of the colon without obstruction identified. This could reflect low grade constipation. 5. Severe emphysema. 6. Mild distal esophageal wall thickening -esophagitis not excluded. 7.  Aortoiliac atherosclerotic vascular disease.   Electronically Signed   By: Van Clines M.D.   On: 06/24/2014 07:37   Dg Chest Port 1 View  06/28/2014   CLINICAL DATA:  Acute on chronic respiratory failure with hypoxia. Healthcare associated pneumonia. Metastatic lung carcinoma.  EXAM: PORTABLE CHEST - 1 VIEW  COMPARISON:  06/25/2014  FINDINGS: Right-sided power port remains in appropriate position. Left lower lung infiltrate shows no significant change. Right lung remains clear. Heart size is within normal  limits. 6C  IMPRESSION: Mild left lower lobe infiltrate, without significant change.   Electronically Signed   By: Earle Gell M.D.   On: 06/28/2014 09:25   Dg Chest Port 1 View  06/25/2014   CLINICAL DATA:  Shortness of breath.  History of lung cancer.  EXAM: PORTABLE CHEST - 1 VIEW  COMPARISON:  CT chest 04/30/2014. Plain film of the chest 06/24/2014.  FINDINGS: The lungs are emphysematous. Left basilar airspace disease seen on yesterday's examination has improved. The right lung appears clear. Heart size is normal. Port-A-Cath is in place.  IMPRESSION: Improved left basilar airspace disease.  Emphysema.   Electronically Signed   By: Inge Rise M.D.   On: 06/25/2014 07:19   Dg Chest Port 1 View  06/24/2014   CLINICAL DATA:  Acute onset of shortness of breath. Initial encounter.  EXAM: PORTABLE CHEST - 1 VIEW  COMPARISON:  PET/CT performed 04/30/2014, and CT of the chest performed 04/03/2014  FINDINGS: The lungs are well-aerated. Milder left basilar airspace opacity raises concern for pneumonia. There is no evidence of pleural effusion or pneumothorax.  Known metastatic disease is not well characterized on radiograph. The cardiomediastinal silhouette is within normal  limits. No acute osseous abnormalities are seen. A right-sided chest port is noted ending about the distal SVC.  IMPRESSION: Mild left basilar airspace opacity raises concern for pneumonia.   Electronically Signed   By: Garald Balding M.D.   On: 06/24/2014 00:57    CBC  Recent Labs Lab 06/27/14 0241 06/29/14 1130 06/30/14 0532  WBC 14.7* 35.9* 30.9*  HGB 8.1* 9.6* 9.6*  HCT 25.2* 29.3* 30.5*  PLT 161 165 138*  MCV 101.2* 99.7 100.7*  MCH 32.5 32.7 31.7  MCHC 32.1 32.8 31.5  RDW 19.5* 19.9* 20.5*    Chemistries   Recent Labs Lab 06/25/14 2121 06/27/14 0241 06/29/14 1130 06/30/14 0532  NA 137 139 137 139  K 4.0 4.8 5.6* 4.1  CL 96 96 88* 89*  CO2 34* 35* 39* 39*  GLUCOSE 300* 132* 187* 227*  BUN '11 15 13 13   '$ CREATININE 0.50 0.34* 0.46 0.41*  CALCIUM 8.5 8.4 9.0 8.4*  AST 19 25  --   --   ALT 40* 39*  --   --   ALKPHOS 97 113  --   --   BILITOT 0.3 0.6  --   --    ------------------------------------------------------------------------------------------------------------------ estimated creatinine clearance is 62.8 mL/min (by C-G formula based on Cr of 0.41). ------------------------------------------------------------------------------------------------------------------ No results for input(s): HGBA1C in the last 72 hours. ------------------------------------------------------------------------------------------------------------------ No results for input(s): CHOL, HDL, LDLCALC, TRIG, CHOLHDL, LDLDIRECT in the last 72 hours. ------------------------------------------------------------------------------------------------------------------ No results for input(s): TSH, T4TOTAL, T3FREE, THYROIDAB in the last 72 hours.  Invalid input(s): FREET3 ------------------------------------------------------------------------------------------------------------------ No results for input(s): VITAMINB12, FOLATE, FERRITIN, TIBC, IRON, RETICCTPCT in the last 72 hours.  Coagulation profile No results for input(s): INR, PROTIME in the last 168 hours.  No results for input(s): DDIMER in the last 72 hours.  Cardiac Enzymes No results for input(s): CKMB, TROPONINI, MYOGLOBIN in the last 168 hours.  Invalid input(s): CK ------------------------------------------------------------------------------------------------------------------ Invalid input(s): [place]  06/30/14 2217 07/01/14 0814 07/01/14 1244 07/01/14 1720 07/01/14 2218 07/02/14 0804  GLUCAP 276* 240* 156* 160* 234* 215*     RAI,RIPUDEEP M.D. [facility] 07/02/2014, 12:46 PM  Pager: [phone]   Between 7am to 7pm - call Pager - [phone]  After 7pm go to [email or web address] - password [email or web address]  Call night coverage  person covering after 7pm

## 2014-07-03 DIAGNOSIS — Z66 Do not resuscitate: Secondary | ICD-10-CM

## 2014-07-03 DIAGNOSIS — C3491 Malignant neoplasm of unspecified part of right bronchus or lung: Secondary | ICD-10-CM

## 2014-07-03 DIAGNOSIS — C3490 Malignant neoplasm of unspecified part of unspecified bronchus or lung: Secondary | ICD-10-CM | POA: Insufficient documentation

## 2014-07-03 LAB — BASIC METABOLIC PANEL
ANION GAP: 15 (ref 5–15)
BUN: 23 mg/dL — ABNORMAL HIGH (ref 6–20)
CHLORIDE: 84 mmol/L — AB (ref 101–111)
CO2: 38 mmol/L — ABNORMAL HIGH (ref 22–32)
Calcium: 9.2 mg/dL (ref 8.9–10.3)
Creatinine, Ser: 0.5 mg/dL (ref 0.44–1.00)
GFR calc non Af Amer: >60 mL/min (ref >60)
GLUCOSE: 350 mg/dL — AB (ref 70–99)
Potassium: 3.7 mmol/L (ref 3.5–5.1)
SODIUM: 137 mmol/L (ref 135–145)

## 2014-07-03 LAB — CBC
HEMATOCRIT: 33.7 % — AB (ref 36.0–46.0)
Hemoglobin: 10.7 g/dL — ABNORMAL LOW (ref 12.0–15.0)
MCH: 31.9 pg (ref 26.0–34.0)
MCHC: 31.8 g/dL (ref 30.0–36.0)
MCV: 100.6 fL — AB (ref 78.0–100.0)
PLATELETS: 230 10*3/uL (ref 150–400)
RBC: 3.35 MIL/uL — ABNORMAL LOW (ref 3.87–5.11)
RDW: 21 % — ABNORMAL HIGH (ref 11.5–15.5)
WBC: 33.9 10*3/uL — ABNORMAL HIGH (ref 4.0–10.5)

## 2014-07-03 LAB — GLUCOSE, CAPILLARY
GLUCOSE-CAPILLARY: 113 mg/dL — AB (ref 70–99)
Glucose-Capillary: 183 mg/dL — ABNORMAL HIGH (ref 70–99)
Glucose-Capillary: 116 mg/dL — ABNORMAL HIGH (ref 70–99)
Glucose-Capillary: 288 mg/dL — ABNORMAL HIGH (ref 70–99)
Glucose-Capillary: 223 mg/dL — ABNORMAL HIGH (ref 70–99)

## 2014-07-03 MED ORDER — LEVOFLOXACIN 750 MG PO TABS
750.0000 mg | ORAL_TABLET | Freq: Every day | ORAL | Status: DC
  Administered 2014-07-04 – 2014-07-06 (×3): 750 mg via ORAL
  Filled 2014-07-03 (×3): qty 1

## 2014-07-03 MED ORDER — LORAZEPAM 1 MG PO TABS
1.0000 mg | ORAL_TABLET | Freq: Four times a day (QID) | ORAL | Status: DC | PRN
  Administered 2014-07-04 – 2014-07-05 (×3): 1 mg via ORAL
  Filled 2014-07-03 (×3): qty 1

## 2014-07-03 MED ORDER — BISACODYL 10 MG RE SUPP: 10.0000 mg | Freq: Every day | RECTAL | Status: DC | PRN

## 2014-07-03 NOTE — Progress Notes (Addendum)
ANTIBIOTIC CONSULT NOTE - FOLLOW UP  Pharmacy Consult for Vancomycin + Zosyn Indication: pneumonia  Allergies  Allergen Reactions  . Benadryl [Diphenhydramine]     hyperactive  . Compazine [Prochlorperazine Edisylate] Other (See Comments)    Didn't work for her  . Excedrin Extra Strength [Aspirin-Acetaminophen-Caffeine] Other (See Comments)    "kidney aches"  . Flexeril [Cyclobenzaprine] Other (See Comments)    SKIN NUMB  . Lexapro [Escitalopram] Other (See Comments)    Gi upset  . Naproxen Other (See Comments)    High BP  . Skelaxin [Metaxalone] Other (See Comments)    DIDN'T WORK  . Tylenol [Acetaminophen] Other (See Comments)    Difficulty breathing    Patient Measurements: Height: '5\' 1"'$  (154.9 cm) Weight: 154 lb 8.7 oz (70.1 kg) IBW/kg (Calculated) : 47.8  Vital Signs: Temp: 98.1 F (36.7 C) (05/06 1223) Temp Source: Oral (05/06 1223) BP: 107/64 mmHg (05/06 1223) Pulse Rate: 91 (05/06 1223) Intake/Output from previous day: 05/05 0701 - 05/06 0700 In: 170 [P.O.:120; IV Piggyback:50] Out: -  Intake/Output from this shift:    Labs:  Recent Labs  07/03/14 0412  WBC 33.9*  HGB 10.7*  PLT 230  CREATININE 0.50   Estimated Creatinine Clearance: 62.8 mL/min (by C-G formula based on Cr of 0.5).  Recent Labs  07/01/14 0525  [place] <4*     Microbiology: Recent Results (from the past 720 hour(s))  Culture, blood (routine x 2)     Status: None   Collection Time: 06/23/14 11:40 PM  Result Value Ref Range Status   Specimen Description BLOOD RIGHT ANTECUBITAL  Final   Special Requests BOTTLES DRAWN AEROBIC AND ANAEROBIC 5CC EACH  Final   Culture   Final    NO GROWTH 5 DAYS Performed at [facility]    Report Status 06/30/2014 FINAL  Final  Culture, blood (routine x 2)     Status: None   Collection Time: 06/23/14 11:50 PM  Result Value Ref Range Status   Specimen Description BLOOD RIGHT WRIST  Final   Special Requests BOTTLES DRAWN AEROBIC  AND ANAEROBIC 5CC EACH  Final   Culture   Final    NO GROWTH 5 DAYS Performed at [facility]    Report Status 06/30/2014 FINAL  Final  Urine culture     Status: None   Collection Time: 06/24/14  2:08 AM  Result Value Ref Range Status   Specimen Description URINE, CLEAN CATCH  Final   Special Requests NONE  Final   Colony Count   Final    70,000 COLONIES/ML Performed at [facility]    Culture   Final    Multiple bacterial morphotypes present, none predominant. Suggest appropriate recollection if clinically indicated. Performed at [facility]    Report Status 06/26/2014 FINAL  Final  MRSA PCR Screening     Status: Abnormal   Collection Time: 06/24/14  2:05 PM  Result Value Ref Range Status   MRSA by PCR POSITIVE (A) NEGATIVE Final    Comment:        The GeneXpert MRSA Assay (FDA approved for NASAL specimens only), is one component of a comprehensive MRSA colonization surveillance program. It is not intended to diagnose MRSA infection nor to guide or monitor treatment for MRSA infections. RESULT CALLED TO, READ BACK BY AND VERIFIED WITH: A. BARLOW RN 17:00 06/24/14 ([email or web address])       Assessment: 64 yo f admitted for SOB.  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 On vanc/zosyn day # 10 for HCAP. WBC= 33.9, afebrile, SCr= 0.5 and CrCl ~ 60. A vancomycin level was noted as < 4 on 5/4 however, blood cultures are negative.   -Spoke with Dr. Tana Coast. With today being day 10 of therapy will discontinue antibiotics after last dose today   Vanc 4/27>> Zosyn 4/27>> Azith x 1 4/27 Rocephin x 1 4/27  4/27 Ucx- multiple bacteria present 4/26 BC x2 ng F   Goal of Therapy:  Vancomycin trough level 15-20 mcg/ml  Plan:  -Discontinue zosyn and vancomycin after last doses today (Discussed with Dr. Tana Coast) -Will sign off. Please contact pharmacy with any further needs  Thank you, Hildred Laser, Pharm D 07/03/2014 2:20  PM

## 2014-07-03 NOTE — Progress Notes (Signed)
[facility]                                                                              Patient Demographics  Alexa Stephens, is a 64 y.o. female, DOB - Oct 04, 1950, PFX:[id number]  Admit date - 06/23/2014   Admitting Physician [name], MD  Outpatient Primary MD for the patient is [name], DO  LOS - 9   Chief Complaint  Patient presents with  . Shortness of Breath       Brief HPI   64 y.o. female with history of stage 4 small cell lung cancer, chronic resp failure on 3-4L O2 at home, COPD, HTN, and anxietywho presented with acute on chronic resp failure with hypoxia. Pt reported the acute onset of cough over a few days with progressive SOB. Actively receiving chemotherapy for small cell lung cancer. Followed by pulm outpt and [facility], Dr. [name].In the ER T 98.4 heart rate in the 110s to 120s, respirations 20s, blood pressure in the 120s to 160s. Satting in the mid 90s with nonrebreather. White blood cell count 7.1, hemoglobin 11.4, creatinine 0.36. Chest x-ray noted mild left basilar airspace opacity concerning for pneumonia.    Assessment & Plan    Principal Problem:   Acute on chronic respiratory failure with hypoxia likely due to left lung pneumonia in the setting of lung cancer, stage IV - Continue O2 via nasal cannula, patient reports she is on O2 at home 3-4 L, continue IV vancomycin and Zosyn - Long discussion with patient's family and palliative medicine, they requested to obtain CT of the chest, abdomen and pelvis, MRI of the brain as was recommended by Dr. [name]. She was to have these imaging on Friday, 4/29 however patient was admitted on 4/27. Patient's son requested not to make any decisions regarding palliative care until the imaging results are available if there is any hope for chemotherapy or further treatment. - No ascites on the abdominal ultrasound - MRI of the brain showed favorable response to treatment with  improvement in all previously visualized metastasis with no new lesions. Discussed in detail with patient's son, [name] in detail today, wants to continue current management, will continue chemo as planned once she has recovered from pneumonia. I was unable to get in touch with the Dr. [name], patient's oncologist, unavailable today.     Active Problems:   Abdominal distension:  -CT chest, abdomen and pelvis showed esophagitis, very small bilateral pleural effusions and pericardial effusion, hepatic metastatic deposits, metastatic deposit in the left greater trochanter.  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  MRI of the brain showed favorable response to treatment with improvement in all previously visualized metastasis and no new lesions  - Family wants to continue current treatment and once she is recovered follow-up with oncology for chemotherapy.  - Appreciate palliative medicine assistance    Code Status: DO  NOT RESUSCITATE   Family Communication: Discussed in detail with the patient, all imaging results, lab results explained to the patient and patient's son, [name]   Disposition Plan: Not medically ready , hopefully to skilled nursing facility on Monday  Time Spent in minutes  25  minutes  Procedures  MRI brain  Consults   none  DVT Prophylaxis  heparin subcutaneous  Medications  Scheduled Meds: . dexamethasone  4 mg Oral BID  . furosemide  40 mg Intravenous BID  . heparin  5,000 Units Subcutaneous 3 times per day  . insulin aspart  0-5 Units Subcutaneous QHS  . insulin aspart  0-9 Units Subcutaneous TID WC  . ipratropium-albuterol  3 mL Inhalation TID  . metoprolol tartrate  25 mg Oral BID  .  pantoprazole  40 mg Oral Daily  . piperacillin-tazobactam (ZOSYN)  IV  3.375 g Intravenous Q8H  . polyethylene glycol  17 g Oral BID  . salmeterol  1 puff Inhalation BID  . senna-docusate  1 tablet Oral BID  . simvastatin  20 mg Oral Daily  . vancomycin  1,000 mg Intravenous Q12H   Continuous Infusions:  PRN Meds:.LORazepam, morphine CONCENTRATE, oxyCODONE, polyethylene glycol   Antibiotics   Anti-infectives    Start     Dose/Rate Route Frequency Ordered Stop   07/01/14 2000  vancomycin (VANCOCIN) IVPB 1000 mg/200 mL premix     1,000 mg 200 mL/hr over 60 Minutes Intravenous Every 12 hours 07/01/14 0912     06/28/14 0526  vancomycin (VANCOCIN) IVPB 1000 mg/200 mL premix  Status:  Discontinued     1,000 mg 200 mL/hr over 60 Minutes Intravenous Every 24 hours 06/27/14 1316 07/01/14 0912   06/24/14 1200  piperacillin-tazobactam (ZOSYN) IVPB 3.375 g     3.375 g 12.5 mL/hr over 240 Minutes Intravenous Every 8 hours 06/24/14 0530     06/24/14 0600  vancomycin (VANCOCIN) 500 mg in sodium chloride 0.9 % 100 mL IVPB  Status:  Discontinued     500 mg 100 mL/hr over 60 Minutes Intravenous Every 12 hours 06/24/14 0530 06/27/14 1316   06/24/14 0545  piperacillin-tazobactam (ZOSYN) IVPB 3.375 g     3.375 g 100 mL/hr over 30 Minutes Intravenous  Once 06/24/14 0530 06/24/14 0646   06/24/14 0215  cefTRIAXone (ROCEPHIN) 1 g in dextrose 5 % 50 mL IVPB  Status:  Discontinued     1 g 100 mL/hr over 30 Minutes Intravenous Every 24 hours 06/24/14 0202 06/24/14 0514   06/24/14 0215  azithromycin (ZITHROMAX) 500 mg in dextrose 5 % 250 mL IVPB  Status:  Discontinued     500 mg 250 mL/hr over 60 Minutes Intravenous Every 24 hours 06/24/14 0202 06/24/14 0514   06/24/14 0045  cefTRIAXone (ROCEPHIN) 1 g in dextrose 5 % 50 mL IVPB     1 g 100 mL/hr over 30 Minutes Intravenous  Once 06/24/14 0042 06/24/14 0214   06/24/14 0045  azithromycin (ZITHROMAX) 500 mg in dextrose 5 % 250 mL IVPB     500 mg 250 mL/hr  over 60 Minutes Intravenous  Once 06/24/14 0042 06/24/14 0214        Subjective:   Alexa Stephens was seen and examined today. Appears somewhat better today, still short of breath, no chest pain, no nausea, vomiting or abdominal pain. O2 sats 99% on 5 L, patient reports that she uses about 4 L at home  Objective:   Blood pressure 120/70, pulse 107, temperature 97.6 F (36.4 C), temperature source  Oral, resp. rate 20, height '5\' 1"'$  (1.549 m), weight 70.1 kg (154 lb 8.7 oz), SpO2 99 %.  Wt Readings from Last 3 Encounters:  06/27/14 70.1 kg (154 lb 8.7 oz)  06/01/14 46.947 kg (103 lb 8 oz)  05/14/14 45.813 kg (101 lb)     Intake/Output Summary (Last 24 hours) at 07/03/14 1146 Last data filed at 07/03/14 0528  Gross per 24 hour  Intake    170 ml  Output      0 ml  Net    170 ml    Exam  General: Alert and oriented x 3, NAD  HEENT:  PERRLA, EOMI,  Neck: Supple, no JVD, no masses  CVS: S1 S2 clear, no mrg  Respiratory: Decreased breath sound at the bases  Abdomen: Soft, NT, normal bowel sounds  Ext: no cyanosis clubbing or edema  Neuro; no new deficits  Skin: No rashes  Psych: Normal affect and demeanor, alert and oriented x3    Data Review   Micro Results Recent Results (from the past 240 hour(s))  Culture, blood (routine x 2)     Status: None   Collection Time: 06/23/14 11:40 PM  Result Value Ref Range Status   Specimen Description BLOOD RIGHT ANTECUBITAL  Final   Special Requests BOTTLES DRAWN AEROBIC AND ANAEROBIC 5CC EACH  Final   Culture   Final    NO GROWTH 5 DAYS Performed at [facility]    Report Status 06/30/2014 FINAL  Final  Culture, blood (routine x 2)     Status: None   Collection Time: 06/23/14 11:50 PM  Result Value Ref Range Status   Specimen Description BLOOD RIGHT WRIST  Final   Special Requests BOTTLES DRAWN AEROBIC AND ANAEROBIC 5CC EACH  Final   Culture   Final    NO GROWTH 5 DAYS Performed at [facility]     Report Status 06/30/2014 FINAL  Final  Urine culture     Status: None   Collection Time: 06/24/14  2:08 AM  Result Value Ref Range Status   Specimen Description URINE, CLEAN CATCH  Final   Special Requests NONE  Final   Colony Count   Final    70,000 COLONIES/ML Performed at [facility]    Culture   Final    Multiple bacterial morphotypes present, none predominant. Suggest appropriate recollection if clinically indicated. Performed at [facility]    Report Status 06/26/2014 FINAL  Final  MRSA PCR Screening     Status: Abnormal   Collection Time: 06/24/14  2:05 PM  Result Value Ref Range Status   MRSA by PCR POSITIVE (A) NEGATIVE Final    Comment:        The GeneXpert MRSA Assay (FDA approved for NASAL specimens only), is one component of a comprehensive MRSA colonization surveillance program. It is not intended to diagnose MRSA infection nor to guide or monitor treatment for MRSA infections. RESULT CALLED TO, READ BACK BY AND VERIFIED WITH: A. BARLOW RN 17:00 06/24/14 ([email or web address])     Radiology Reports Ct Chest W Contrast  07/01/2014   CLINICAL DATA:  Abdominal distention. History of metastatic lung carcinoma.  EXAM: CT CHEST, ABDOMEN, AND PELVIS WITH CONTRAST  TECHNIQUE: Multidetector CT imaging of the chest, abdomen and pelvis was performed following the standard protocol during bolus administration of intravenous contrast.  CONTRAST:  100 mL OMNIPAQUE IOHEXOL 300 MG/ML  SOLN  COMPARISON:  CT chest, abdomen and pelvis 04/03/2014. PET CT scan 04/29/2014.  FINDINGS: CT CHEST FINDINGS  Lymphadenopathy in the chest appears improved compared to the prior examinations. A supraclavicular node on the left which had measured 1.4 cm on the prior PET CT today measures 0.7 cm on image 4. A pretracheal node which had measured 1.0 cm on the prior PET CT scan today measures 0.9 cm on image 23. AP window node measuring 1.0 cm also on image 23 is decreased from 2.4 x 1.5 cm on the  prior chest CT. No new or enlarging lymph nodes are identified.  The walls of the upper esophagus are thickened. Very small left pleural effusion is noted. Trace amount of pleural fluid is seen on the right. There is also a trace amount of pericardial fluid. Heart size is normal.  Lungs demonstrate severe centrilobular emphysema as on prior examinations. Previously seen left lower lobe mass versus adenopathy in the left hilum is not visible on this examination. Similarly, a lobulated nodule in the posterior right lower lobe seen on the prior CT chest is no longer visible. Dependent airspace opacity in the lower lobes bilaterally likely reflects compressive atelectasis.  CT ABDOMEN AND PELVIS FINDINGS  Hepatic metastatic deposit which had measured 3.7 x 5.1 cm on the prior PET CT scan today measures 1.8 x 2.0 cm on image 58. A second lesion in the right hepatic lobe which had measured 3.7 x 3.2 cm today measures 1.6 x 1.8 cm on image 63. A third lesion in the inferior right hepatic lobe which had measured 2.9 x 2.8 cm today measures 1.4 x 1.4 cm on image 62. No new or enlarging liver lesions are identified.  The adrenal glands, spleen, pancreas and kidneys all appear normal. The gallbladder is unremarkable. Uterus, adnexa and urinary bladder appear normal. Sigmoid diverticulosis without diverticulitis is identified. Prominent stool burden in the colon is noted. The stomach, small bowel and appendix appear normal. Aortoiliac atherosclerosis without aneurysm is identified.  Metastatic deposit in the left greater trochanter is seen with small areas of cortical disruption along the medial margin of the greater trochanter. No other osseous metastatic disease is visible by CT scan.  IMPRESSION: Improved appearance metastatic lung carcinoma when decreased lymphadenopathy in the chest, improved hepatic metastases and nonvisualization of previously seen lung nodules. Metastatic deposit in left greater trochanter is again  seen.  Thickening of the walls of the upper esophagus compatible with esophagitis.  Very small bilateral pleural effusions and pericardial effusion.  Dependent basilar atelectasis.  Extensive emphysematous disease.   Electronically Signed   By: Inge Rise M.D.   On: 07/01/2014 17:30   Mr Brain Wo Contrast  07/01/2014   CLINICAL DATA:  History of stage IV small-cell lung cancer with brain metastases. Admitted for hypoxic respiratory failure.  EXAM: MRI HEAD WITHOUT CONTRAST  TECHNIQUE: Multiplanar, multiecho pulse sequences of the brain and surrounding structures were obtained without intravenous contrast.  COMPARISON:  04/24/2014  FINDINGS: Calvarium and upper cervical spine: No visible skeletal metastases.  Orbits: Bilateral cataract resection. No orbital mass or acute finding.  Sinuses: Anterior nasal septal perforation. There is mucosal thickening and effusions which have developed bilaterally in the mastoid cavities. No sinus fluid level.  Brain: Known brain metastases are not clearly visualized due to lack of intravenous contrast. Previously seen edema around metastases in the bilateral cerebellum has resolved (patient is on chemotherapy and steroids). There is no evidence of interval hemorrhage. Advanced chronic small vessel disease with ischemic gliosis throughout the bilateral cerebral white matter and diffusely in the pons.  Punctate diffusion hyperintensity below the mid right sylvian fissure is chronic compared to the previous study and represents a metastasis. The diffusion signal abnormality is decreased in size, of indeterminate clinical significance. A faint punctate diffusion hyperintensity in the left frontal white matter does not correlate with previously seen enhancing foci and is indeterminate.  No convincing infarct. No hemorrhage, hydrocephalus, or evidence of major vessel occlusion.  IMPRESSION: 1. No acute intracranial findings. 2. Favorable resolution of FLAIR signal abnormality at  bilateral cerebellar metastases. Cannot re-stage brain metastases without intravenous contrast. 3. Bilateral mastoiditis, new from February 2016.   Electronically Signed   By: Monte Fantasia M.D.   On: 07/01/2014 11:55   Mr Jeri Cos Contrast  07/02/2014   CLINICAL DATA:  Evaluate for interval improvement in brain metastases, currently undergoing chemotherapy. Patient has lung cancer with both brain and bone metastases.  EXAM: MRI HEAD WITH CONTRAST  TECHNIQUE: Multiplanar, multiecho pulse sequences of the brain and surrounding structures were obtained with intravenous contrast.  COMPARISON:  Noncontrast MRI brain 07/01/2014. MR brain without and with contrast 04/24/2014.  CONTRAST:  92m MULTIHANCE GADOBENATE DIMEGLUMINE 529 MG/ML IV SOLN  FINDINGS: There is improvement in all previously visualized metastases, without new lesions. Furthermore, as noted on yesterday's report, there is favorable resolution of FLAIR signal abnormality in the BILATERAL cerebellar metastases.  Faint residual trace is of enhancement are observed, at the following locations, on postcontrast axial series 3:  - image 8, LEFT cerebellum.  - image 9 RIGHT ventral cerebellum.  - image 12, RIGHT lateral cerebellum.  - image 24, RIGHT superior temporal gyrus.  - image 29, RIGHT centrum semiovale.  IMPRESSION: Favorable response to treatment with improvement in all previously visualized metastases, without new lesions.   Electronically Signed   By: JRolla FlattenM.D.   On: 07/02/2014 14:19   Ct Abdomen Pelvis W Contrast  07/01/2014   CLINICAL DATA:  Abdominal distention. History of metastatic lung carcinoma.  EXAM: CT CHEST, ABDOMEN, AND PELVIS WITH CONTRAST  TECHNIQUE: Multidetector CT imaging of the chest, abdomen and pelvis was performed following the standard protocol during bolus administration of intravenous contrast.  CONTRAST:  100 mL OMNIPAQUE IOHEXOL 300 MG/ML  SOLN  COMPARISON:  CT chest, abdomen and pelvis 04/03/2014. PET CT scan  04/29/2014.  FINDINGS: CT CHEST FINDINGS  Lymphadenopathy in the chest appears improved compared to the prior examinations. A supraclavicular node on the left which had measured 1.4 cm on the prior PET CT today measures 0.7 cm on image 4. A pretracheal node which had measured 1.0 cm on the prior PET CT scan today measures 0.9 cm on image 23. AP window node measuring 1.0 cm also on image 23 is decreased from 2.4 x 1.5 cm on the prior chest CT. No new or enlarging lymph nodes are identified.  The walls of the upper esophagus are thickened. Very small left pleural effusion is noted. Trace amount of pleural fluid is seen on the right. There is also a trace amount of pericardial fluid. Heart size is normal.  Lungs demonstrate severe centrilobular emphysema as on prior examinations. Previously seen left lower lobe mass versus adenopathy in the left hilum is not visible on this examination. Similarly, a lobulated nodule in the posterior right lower lobe seen on the prior CT chest is no longer visible. Dependent airspace opacity in the lower lobes bilaterally likely reflects compressive atelectasis.  CT ABDOMEN AND PELVIS FINDINGS  Hepatic metastatic deposit which had measured 3.7 x 5.1 cm on  the prior PET CT scan today measures 1.8 x 2.0 cm on image 58. A second lesion in the right hepatic lobe which had measured 3.7 x 3.2 cm today measures 1.6 x 1.8 cm on image 63. A third lesion in the inferior right hepatic lobe which had measured 2.9 x 2.8 cm today measures 1.4 x 1.4 cm on image 62. No new or enlarging liver lesions are identified.  The adrenal glands, spleen, pancreas and kidneys all appear normal. The gallbladder is unremarkable. Uterus, adnexa and urinary bladder appear normal. Sigmoid diverticulosis without diverticulitis is identified. Prominent stool burden in the colon is noted. The stomach, small bowel and appendix appear normal. Aortoiliac atherosclerosis without aneurysm is identified.  Metastatic deposit in  the left greater trochanter is seen with small areas of cortical disruption along the medial margin of the greater trochanter. No other osseous metastatic disease is visible by CT scan.  IMPRESSION: Improved appearance metastatic lung carcinoma when decreased lymphadenopathy in the chest, improved hepatic metastases and nonvisualization of previously seen lung nodules. Metastatic deposit in left greater trochanter is again seen.  Thickening of the walls of the upper esophagus compatible with esophagitis.  Very small bilateral pleural effusions and pericardial effusion.  Dependent basilar atelectasis.  Extensive emphysematous disease.   Electronically Signed   By: Inge Rise M.D.   On: 07/01/2014 17:30   Ct Abdomen Pelvis W Contrast  06/24/2014   CLINICAL DATA:  Metastatic lung cancer. New onset of abdominal distention. Increasing shortness of breath.  EXAM: CT ABDOMEN AND PELVIS WITH CONTRAST  TECHNIQUE: Multidetector CT imaging of the abdomen and pelvis was performed using the standard protocol following bolus administration of intravenous contrast.  CONTRAST:  80 cc Omnipaque 300  COMPARISON:  Multiple exams, including 04/30/2014  FINDINGS: Lower chest: Consolidation of much of the left lower lobe and parts of the posterior basal segment right lower lobe concerning for pneumonia or pulmonary hemorrhage. Severe emphysema. Mild distal esophageal wall thickening.  Hepatobiliary: Hypodense metastatic lesions along the falciform ligament and inferiorly in the right hepatic lobe. Index lesion along the falciform ligament measures approximately 2.5 by 1.8 cm. No new liver lesions are identified. The gallbladder is mildly distended. No biliary dilatation observed.  Pancreas: Unremarkable  Spleen: Unremarkable  Adrenals/Urinary Tract: Unremarkable  Stomach/Bowel: There is prominence of stool in the proximal half of the colon but not the distal half. Sigmoid colon diverticulosis. Appendix unremarkable.   Vascular/Lymphatic: Aortoiliac atherosclerotic vascular disease.  Reproductive: Unremarkable  Other: No supplemental non-categorized findings.  Musculoskeletal: Dextroconvex rotary scoliosis. The patient has known osseous metastatic disease for example in the left femur greater trochanter. Bony demineralization is present.  IMPRESSION: 1. Consolidation and much of the left lower lobe and in the posterior basal segment right lower lobe, favoring pneumonia or pulmonary hemorrhage. 2. Hepatic masses compatible with hepatic metastatic disease is, reduced in size over the past 2 months. 3. The patient has known osseous metastatic disease to the left proximal femur. 4. Mild prominence of stool in the proximal half of the colon without obstruction identified. This could reflect low grade constipation. 5. Severe emphysema. 6. Mild distal esophageal wall thickening -esophagitis not excluded. 7.  Aortoiliac atherosclerotic vascular disease.   Electronically Signed   By: Van Clines M.D.   On: 06/24/2014 07:37   US Abdomen Limited  07/02/2014   CLINICAL DATA:  Abdominal distension, suspect ascites.  EXAM: LIMITED ABDOMEN ULTRASOUND FOR ASCITES  TECHNIQUE: Limited ultrasound survey for ascites was performed in all four  abdominal quadrants.  COMPARISON:  CT scan dated Jul 01, 2014  FINDINGS: Interrogation of all 4 quadrants of the abdomen reveals no ascites. A small left pleural effusion is demonstrated.  IMPRESSION: No ascites is demonstrated.  There is a small left pleural effusion.   Electronically Signed   By: David  Martinique M.D.   On: 07/02/2014 16:16   Dg Chest Port 1 View  06/28/2014   CLINICAL DATA:  Acute on chronic respiratory failure with hypoxia. Healthcare associated pneumonia. Metastatic lung carcinoma.  EXAM: PORTABLE CHEST - 1 VIEW  COMPARISON:  06/25/2014  FINDINGS: Right-sided power port remains in appropriate position. Left lower lung infiltrate shows no significant change. Right lung remains clear.  Heart size is within normal limits. 6C  IMPRESSION: Mild left lower lobe infiltrate, without significant change.   Electronically Signed   By: Earle Gell M.D.   On: 06/28/2014 09:25   Dg Chest Port 1 View  06/25/2014   CLINICAL DATA:  Shortness of breath.  History of lung cancer.  EXAM: PORTABLE CHEST - 1 VIEW  COMPARISON:  CT chest 04/30/2014. Plain film of the chest 06/24/2014.  FINDINGS: The lungs are emphysematous. Left basilar airspace disease seen on yesterday's examination has improved. The right lung appears clear. Heart size is normal. Port-A-Cath is in place.  IMPRESSION: Improved left basilar airspace disease.  Emphysema.   Electronically Signed   By: Inge Rise M.D.   On: 06/25/2014 07:19   Dg Chest Port 1 View  06/24/2014   CLINICAL DATA:  Acute onset of shortness of breath. Initial encounter.  EXAM: PORTABLE CHEST - 1 VIEW  COMPARISON:  PET/CT performed 04/30/2014, and CT of the chest performed 04/03/2014  FINDINGS: The lungs are well-aerated. Milder left basilar airspace opacity raises concern for pneumonia. There is no evidence of pleural effusion or pneumothorax.  Known metastatic disease is not well characterized on radiograph. The cardiomediastinal silhouette is within normal limits. No acute osseous abnormalities are seen. A right-sided chest port is noted ending about the distal SVC.  IMPRESSION: Mild left basilar airspace opacity raises concern for pneumonia.   Electronically Signed   By: Garald Balding M.D.   On: 06/24/2014 00:57    CBC  Recent Labs Lab 06/27/14 0241 06/29/14 1130 06/30/14 0532 07/03/14 0412  WBC 14.7* 35.9* 30.9* 33.9*  HGB 8.1* 9.6* 9.6* 10.7*  HCT 25.2* 29.3* 30.5* 33.7*  PLT 161 165 138* 230  MCV 101.2* 99.7 100.7* 100.6*  MCH 32.5 32.7 31.7 31.9  MCHC 32.1 32.8 31.5 31.8  RDW 19.5* 19.9* 20.5* 21.0*    Chemistries   Recent Labs Lab 06/27/14 0241 06/29/14 1130 06/30/14 0532 07/03/14 0412  NA 139 137 139 137  K 4.8 5.6* 4.1 3.7    CL 96 88* 89* 84*  CO2 35* 39* 39* 38*  GLUCOSE 132* 187* 227* 350*  BUN '15 13 13 '$ 23*  CREATININE 0.34* 0.46 0.41* 0.50  CALCIUM 8.4 9.0 8.4* 9.2  AST 25  --   --   --   ALT 39*  --   --   --   ALKPHOS 113  --   --   --   BILITOT 0.6  --   --   --    ------------------------------------------------------------------------------------------------------------------ estimated creatinine clearance is 62.8 mL/min (by C-G formula based on Cr of 0.5). ------------------------------------------------------------------------------------------------------------------ No results for input(s): HGBA1C in the last 72 hours. ------------------------------------------------------------------------------------------------------------------ No results for input(s): CHOL, HDL, LDLCALC, TRIG, CHOLHDL, LDLDIRECT in the last 72 hours. ------------------------------------------------------------------------------------------------------------------  No results for input(s): TSH, T4TOTAL, T3FREE, THYROIDAB in the last 72 hours.  Invalid input(s): FREET3 ------------------------------------------------------------------------------------------------------------------ No results for input(s): VITAMINB12, FOLATE, FERRITIN, TIBC, IRON, RETICCTPCT in the last 72 hours.  Coagulation profile No results for input(s): INR, PROTIME in the last 168 hours.  No results for input(s): DDIMER in the last 72 hours.  Cardiac Enzymes No results for input(s): CKMB, TROPONINI, MYOGLOBIN in the last 168 hours.  Invalid input(s): CK ------------------------------------------------------------------------------------------------------------------ Invalid input(s): [place]  07/01/14 2218 07/02/14 0804 07/02/14 1306 07/02/14 1711 07/02/14 2218 07/03/14 0822  GLUCAP 234* 215* 268* 256* 113* 183*     Nakeya Adinolfi M.D. [facility] 07/03/2014, 11:46 AM  Pager: [phone]   Between 7am to 7pm - call  Pager - [phone]  After 7pm go to [email or web address] - password [email or web address]  Call night coverage person covering after 7pm

## 2014-07-03 NOTE — Progress Notes (Signed)
Daily Progress Note   Patient Name: Alexa Stephens       Date: 07/03/2014 DOB: [birth year]  Age: 64 y.o. MRN#: [id number] Attending Physician: Mendel Corning, MD Primary Care Physician: [name], DO Admit Date: 06/23/2014  Reason for Consultation/Follow-up: Disposition, Establishing goals of care, Non pain symptom management and Psychosocial/spiritual support  Subjective: Spoke by telephone with son [name] discussion had regarding diagnosis, prognosis, scan results, GOC and treatment and disposition options.  Plan at this time is hopeful for dc to SNF for rehabilitation and continued treatment for her cancer diagnosis.  Interval Events: Head Ct -- Favorable response to treatment with improvement in all previously visualized metastases, without new lesions.  Length of Stay: 9 days  Current Medications: Scheduled Meds:  . dexamethasone  4 mg Oral BID  . furosemide  40 mg Intravenous BID  . heparin  5,000 Units Subcutaneous 3 times per day  . insulin aspart  0-5 Units Subcutaneous QHS  . insulin aspart  0-9 Units Subcutaneous TID WC  . ipratropium-albuterol  3 mL Inhalation TID  . metoprolol tartrate  25 mg Oral BID  . pantoprazole  40 mg Oral Daily  . piperacillin-tazobactam (ZOSYN)  IV  3.375 g Intravenous Q8H  . polyethylene glycol  17 g Oral BID  . salmeterol  1 puff Inhalation BID  . senna-docusate  1 tablet Oral BID  . simvastatin  20 mg Oral Daily  . vancomycin  1,000 mg Intravenous Q12H    Continuous Infusions:    PRN Meds: LORazepam, morphine CONCENTRATE, oxyCODONE, polyethylene glycol  Palliative Performance Scale: 30 % at best     Vital Signs: BP 120/70 mmHg  Pulse 107  Temp(Src) 97.6 F (36.4 C) (Oral)  Resp 20  Ht '5\' 1"'$  (1.549 m)  Wt 70.1 kg (154 lb 8.7 oz)  BMI 29.22 kg/m2  SpO2 99% SpO2: SpO2: 99 % O2 Device: O2 Device: Nasal Cannula O2 Flow Rate: O2 Flow Rate (L/min): 5 L/min  Intake/output summary:  Intake/Output Summary (Last 24 hours) at  07/03/14 1040 Last data filed at 07/03/14 0528  Gross per 24 hour  Intake    170 ml  Output      0 ml  Net    170 ml   LBM:   Baseline Weight: Weight: 52.164 kg (115 lb) Most recent weight: Weight: 70.1 kg (154 lb 8.7 oz)  Physical Exam: General: chronically ill appearing, NAD HEENT: moist buccal membranes, no exudate RJJ:[id number] Resp:decreased in bases ABD: no distention noted, soft +BS            Additional Data Reviewed: Recent Labs     07/03/14  0412  WBC  33.9*  HGB  10.7*  PLT  230  NA  137  BUN  23*  CREATININE  0.50     Problem List:  Patient Active Problem List   Diagnosis Date Noted  . Palliative care encounter 07/02/2014  . Dyspnea 07/02/2014  . DNR (do not resuscitate) 07/02/2014  . Lung cancer, main bronchus   . Weakness generalized   . DNR (do not resuscitate) discussion   . HCAP (healthcare-associated pneumonia)   . Respiratory acidosis   . Metastatic lung cancer (metastasis from lung to other site)   . Brain metastases   . Metastases to the liver   . Bone metastases   . Essential hypertension   . Sinus tachycardia   . Acute on chronic respiratory failure with hypoxia 06/24/2014  . Abdominal distension 06/24/2014  . Pain  in the abdomen 06/24/2014  . Weakness 06/24/2014  . Cancer associated pain 06/24/2014  . Edema 06/24/2014  . Shingles 05/06/2014  . Sinusitis 04/22/2014  . 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 Liver masses 03/27/2014  . Rapid weight loss 12/25/2013  . Anxiety and depression 09/02/2013  . Tremor 06/11/2013  . Constipation 06/11/2013  . Anemia 06/11/2013  . Migraine headache 04/09/2012  . HTN (hypertension) 11/22/2011  . HLD (hyperlipidemia) 11/22/2011  . Chronic obstructive pulmonary disease (COPD) 11/08/2011  . COPD exacerbation 11/01/2011  . Chronic respiratory failure with hypoxia 11/01/2011  . Tobacco abuse 11/01/2011  . Health care maintenance 11/01/2011     Palliative Care Assessment & Plan     Code Status:  DNR  Goals of Care:  Patient and family remain hopeful for improvement.  Plan is to dc to SNF for rehabilitation and continue to pursue offered and available medical interventions  For cancer treatment.   Desire for further Chaplaincy support:yes  3. Symptom Management:  Dyspnea: Roxanol  5 mg po/sl every 2 hrs prn  Anxiety: Ativan 1 mg po every 6 hrs prn  4. Palliative Prophylaxis:  Stool Softner: Dulcolax supp prn daily  5. Prognosis: < 6 months  5. Discharge Planning: [facility] for rehab with Palliative care service follow-up   Care plan was discussed with patient and her son  Thank you for allowing the Palliative Medicine Team to assist in the care of this patient.   Time In:  1300 Time Out: 1335 Total Time 35 min Prolonged Time Billed  no     Greater than 50%  of this time was spent counseling and coordinating care related to the above assessment and plan.   Knox Royalty, NP  07/03/2014, 10:40 AM  Please contact Palliative Medicine Team phone at [phone] for questions and concerns.   Discussed  With  Kerby Moors

## 2014-07-03 NOTE — Clinical Social Work Note (Signed)
CSW spoke to patient's son [name] who is the health care power of attorney per request from patient about SNF placement.  John was asking questions about the difference between hospice, SNF rehab, hospice at home, SNF followed by palliative.  CSW explained to patient's son about how medicare covers payment for patient to go to SNF.  Patient's son was given bed offers and informed the physician is thinking maybe early part of next week he will be ready to discharge.  Patient's son stated he will look at the different facilities over today and over the weekend to find out which one patient can go to.  Patient's son stated he is wanting to have patient go to a SNF for short term rehab and to continue her chemo treatments.  Patient's son spoke to palliative nurse as well to discuss options for patient, and he is interested in his mom going to SNF for rehab, and if she deteriorates they are open to having palliative at nursing home follow patient.  Patient's son expressed how he is hopeful that she will improve but is aware that patient's health may deteriorate more and she will need hospice or palliative services to follow her once she is not making any more progress.  Patient's son did not have any other questions, he was given CSW phone number and will call if he has any other questions.  Jones Broom. [place], MSW, [place] 07/03/2014 3:59 PM

## 2014-07-04 LAB — CBC
HCT: 33.4 % — ABNORMAL LOW (ref 36.0–46.0)
Hemoglobin: 10.5 g/dL — ABNORMAL LOW (ref 12.0–15.0)
MCH: 31.5 pg (ref 26.0–34.0)
MCHC: 31.4 g/dL (ref 30.0–36.0)
MCV: 100.3 fL — ABNORMAL HIGH (ref 78.0–100.0)
Platelets: 256 10*3/uL (ref 150–400)
RBC: 3.33 MIL/uL — ABNORMAL LOW (ref 3.87–5.11)
RDW: 20.5 % — AB (ref 11.5–15.5)
WBC: 33.8 10*3/uL — ABNORMAL HIGH (ref 4.0–10.5)

## 2014-07-04 LAB — BASIC METABOLIC PANEL
Anion gap: 10 (ref 5–15)
BUN: 20 mg/dL (ref 6–20)
CHLORIDE: 83 mmol/L — AB (ref 101–111)
CO2: 42 mmol/L — AB (ref 22–32)
Calcium: 9 mg/dL (ref 8.9–10.3)
Creatinine, Ser: 0.51 mg/dL (ref 0.44–1.00)
GFR calc non Af Amer: >60 mL/min (ref >60)
Glucose, Bld: 180 mg/dL — ABNORMAL HIGH (ref 70–99)
POTASSIUM: 3.7 mmol/L (ref 3.5–5.1)
Sodium: 135 mmol/L (ref 135–145)

## 2014-07-04 LAB — BRAIN NATRIURETIC PEPTIDE: B Natriuretic Peptide: 335 pg/mL — ABNORMAL HIGH (ref 0.0–100.0)

## 2014-07-04 LAB — GLUCOSE, CAPILLARY
GLUCOSE-CAPILLARY: 189 mg/dL — AB (ref 70–99)
GLUCOSE-CAPILLARY: 229 mg/dL — AB (ref 70–99)
Glucose-Capillary: 180 mg/dL — ABNORMAL HIGH (ref 70–99)
Glucose-Capillary: 116 mg/dL — ABNORMAL HIGH (ref 70–99)

## 2014-07-04 LAB — ALBUMIN: ALBUMIN: 3.3 g/dL — AB (ref 3.5–5.0)

## 2014-07-04 MED ORDER — SENNA 8.6 MG PO TABS
1.0000 | ORAL_TABLET | Freq: Two times a day (BID) | ORAL | Status: DC
  Administered 2014-07-04 – 2014-07-06 (×3): 8.6 mg via ORAL
  Filled 2014-07-04 (×3): qty 1

## 2014-07-04 MED ORDER — FUROSEMIDE 10 MG/ML IJ SOLN
60.0000 mg | Freq: Two times a day (BID) | INTRAMUSCULAR | Status: DC
Start: 2014-07-04 — End: 2014-07-06
  Administered 2014-07-04 – 2014-07-05 (×3): 60 mg via INTRAVENOUS
  Filled 2014-07-04 (×4): qty 6

## 2014-07-04 MED ORDER — DOCUSATE SODIUM 100 MG PO CAPS
200.0000 mg | ORAL_CAPSULE | Freq: Two times a day (BID) | ORAL | Status: DC
  Administered 2014-07-04 – 2014-07-06 (×4): 200 mg via ORAL
  Filled 2014-07-04 (×4): qty 2

## 2014-07-04 MED ORDER — LORATADINE 10 MG PO TABS
10.0000 mg | ORAL_TABLET | Freq: Once | ORAL | Status: AC
  Administered 2014-07-04: 10 mg via ORAL
  Filled 2014-07-04: qty 1

## 2014-07-04 MED ORDER — FUROSEMIDE 10 MG/ML IJ SOLN
20.0000 mg | Freq: Once | INTRAMUSCULAR | Status: AC
  Administered 2014-07-04: 20 mg via INTRAVENOUS
  Filled 2014-07-04: qty 2

## 2014-07-04 NOTE — Progress Notes (Addendum)
[facility]                                                                              Patient Demographics  Alexa Stephens, is a 64 y.o. female, DOB - 1950/03/29, ZOX:[id number]  Admit date - 06/23/2014   Admitting Physician [name], MD  Outpatient Primary MD for the patient is [name], DO  LOS - 10   Chief Complaint  Patient presents with  . Shortness of Breath       Brief HPI   64 y.o. female with history of stage 4 small cell lung cancer, chronic resp failure on 3-4L O2 at home, COPD, HTN, and anxietywho presented with acute on chronic resp failure with hypoxia. Pt reported the acute onset of cough over a few days with progressive SOB. Actively receiving chemotherapy for small cell lung cancer. Followed by pulm outpt and [facility], Dr. [name].In the ER T 98.4 heart rate in the 110s to 120s, respirations 20s, blood pressure in the 120s to 160s. Satting in the mid 90s with nonrebreather. White blood cell count 7.1, hemoglobin 11.4, creatinine 0.36. Chest x-ray noted mild left basilar airspace opacity concerning for pneumonia.    Assessment & Plan    Principal Problem:   Acute on chronic respiratory failure with hypoxia likely due to left lung pneumonia in the setting of lung cancer, stage IV - Slightly better today, sitting up in the chair, O2 sats 94% on 4 L, discontinued IV vancomycin and Zosyn yesterday, completed 10 days, for now placed on oral levofloxacin for total of 4 more days - Long discussion with patient's family and palliative medicine, they requested to obtain CT of the chest, abdomen and pelvis, MRI of the brain as was recommended by Dr. [name]. She was to have these imaging on 4/29 however patient was admitted on 4/27. Patient's son requested not to make any decisions regarding palliative care until the imaging results are available if there is any hope for chemotherapy or further treatment. - No ascites on the  abdominal ultrasound - MRI of the brain showed favorable response to treatment with improvement in all previously visualized metastasis with no new lesions. Per patient's family, son, Alexa Stephens, wishes to continue current management and chemotherapy, plan skilled nursing facility for rehab  - I's and O's shows 8 L positive balance, increased her Lasix to '60mg'$  twice a day, will also check albumin level , 2-D echo, BNP   Active Problems:   Abdominal distension: ? Constipation, patient couldn't remember her last BM -CT chest, abdomen and pelvis showed esophagitis, very small bilateral pleural effusions and pericardial effusion, hepatic metastatic deposits, metastatic deposit in the left greater trochanter.  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  MRI of the brain showed favorable response to treatment with improvement in all previously visualized metastasis and no new lesions  -  Family wants to continue current treatment and once she is recovered follow-up with oncology for chemotherapy.  - Appreciate palliative medicine assistance  Generalized debility - PT OT recommended skilled nursing facility    Code Status: DO NOT RESUSCITATE   Family Communication: Discussed in detail with the patient, all imaging results, lab results explained to the patient and patient's son, Alexa Stephens yesterday   Disposition Plan: Not medically ready , hopefully to skilled nursing facility on Monday  Time Spent in minutes  25  minutes  Procedures  MRI brain  Consults   none  DVT Prophylaxis  heparin subcutaneous  Medications  Scheduled Meds: . dexamethasone  4 mg Oral BID  . furosemide  40 mg Intravenous BID  .  heparin  5,000 Units Subcutaneous 3 times per day  . insulin aspart  0-5 Units Subcutaneous QHS  . insulin aspart  0-9 Units Subcutaneous TID WC  . ipratropium-albuterol  3 mL Inhalation TID  . levofloxacin  750 mg Oral Daily  . metoprolol tartrate  25 mg Oral BID  . pantoprazole  40 mg Oral Daily  . polyethylene glycol  17 g Oral BID  . salmeterol  1 puff Inhalation BID  . senna-docusate  1 tablet Oral BID  . simvastatin  20 mg Oral Daily   Continuous Infusions:  PRN Meds:.bisacodyl, LORazepam, morphine CONCENTRATE, oxyCODONE, polyethylene glycol   Antibiotics   Anti-infectives    Start     Dose/Rate Route Frequency Ordered Stop   07/04/14 1000  levofloxacin (LEVAQUIN) tablet 750 mg     750 mg Oral Daily 07/03/14 1734     07/01/14 2000  vancomycin (VANCOCIN) IVPB 1000 mg/200 mL premix     1,000 mg 200 mL/hr over 60 Minutes Intravenous Every 12 hours 07/01/14 0912 07/03/14 2151   06/28/14 0526  vancomycin (VANCOCIN) IVPB 1000 mg/200 mL premix  Status:  Discontinued     1,000 mg 200 mL/hr over 60 Minutes Intravenous Every 24 hours 06/27/14 1316 07/01/14 0912   06/24/14 1200  piperacillin-tazobactam (ZOSYN) IVPB 3.375 g     3.375 g 12.5 mL/hr over 240 Minutes Intravenous Every 8 hours 06/24/14 0530 07/04/14 0051   06/24/14 0600  vancomycin (VANCOCIN) 500 mg in sodium chloride 0.9 % 100 mL IVPB  Status:  Discontinued     500 mg 100 mL/hr over 60 Minutes Intravenous Every 12 hours 06/24/14 0530 06/27/14 1316   06/24/14 0545  piperacillin-tazobactam (ZOSYN) IVPB 3.375 g     3.375 g 100 mL/hr over 30 Minutes Intravenous  Once 06/24/14 0530 06/24/14 0646   06/24/14 0215  cefTRIAXone (ROCEPHIN) 1 g in dextrose 5 % 50 mL IVPB  Status:  Discontinued     1 g 100 mL/hr over 30 Minutes Intravenous Every 24 hours 06/24/14 0202 06/24/14 0514   06/24/14 0215  azithromycin (ZITHROMAX) 500 mg in dextrose 5 % 250 mL IVPB  Status:  Discontinued     500 mg 250 mL/hr over 60 Minutes Intravenous  Every 24 hours 06/24/14 0202 06/24/14 0514   06/24/14 0045  cefTRIAXone (ROCEPHIN) 1 g in dextrose 5 % 50 mL IVPB     1 g 100 mL/hr over 30 Minutes Intravenous  Once 06/24/14 0042 06/24/14 0214   06/24/14 0045  azithromycin (ZITHROMAX) 500 mg in dextrose 5 % 250 mL IVPB     500 mg 250 mL/hr over 60 Minutes Intravenous  Once 06/24/14 0042 06/24/14 0214        Subjective:   Charliegh Vasudevan was seen and  examined today. Appears somewhat better today, sitting up in the chair, shortness of breath not any worse per patient. No abdominal pain, nausea or vomiting however appears to be constipated. O2 sats 94% on 4 L, patient is on 4 L at home also   Objective:   Blood pressure 123/65, pulse 97, temperature 97.5 F (36.4 C), temperature source Oral, resp. rate 20, height '5\' 1"'$  (1.549 m), weight 70.1 kg (154 lb 8.7 oz), SpO2 94 %.  Wt Readings from Last 3 Encounters:  06/27/14 70.1 kg (154 lb 8.7 oz)  06/01/14 46.947 kg (103 lb 8 oz)  05/14/14 45.813 kg (101 lb)     Intake/Output Summary (Last 24 hours) at 07/04/14 1042 Last data filed at 07/04/14 0600  Gross per 24 hour  Intake   1460 ml  Output      0 ml  Net   1460 ml    Exam  General: Alert and oriented x 3, NAD  HEENT:  PERRLA, EOMI,  Neck: Supple, no JVD,   CVS: S1 S2 clear, no mrg  Respiratory: No wheezing or rhonchi, decreased breath sounds at the bases  Abdomen: Soft, NT, mildly distended, normal bowel sounds  Ext: no cyanosis clubbing or edema  Neuro; no new deficits  Skin: No rashes  Psych: Normal affect and demeanor, alert and oriented x3    Data Review   Micro Results Recent Results (from the past 240 hour(s))  MRSA PCR Screening     Status: Abnormal   Collection Time: 06/24/14  2:05 PM  Result Value Ref Range Status   MRSA by PCR POSITIVE (A) NEGATIVE Final    Comment:        The GeneXpert MRSA Assay (FDA approved for NASAL specimens only), is one component of a comprehensive MRSA  colonization surveillance program. It is not intended to diagnose MRSA infection nor to guide or monitor treatment for MRSA infections. RESULT CALLED TO, READ BACK BY AND VERIFIED WITH: A. BARLOW RN 17:00 06/24/14 ([email or web address])     Radiology Reports Ct Chest W Contrast  07/01/2014   CLINICAL DATA:  Abdominal distention. History of metastatic lung carcinoma.  EXAM: CT CHEST, ABDOMEN, AND PELVIS WITH CONTRAST  TECHNIQUE: Multidetector CT imaging of the chest, abdomen and pelvis was performed following the standard protocol during bolus administration of intravenous contrast.  CONTRAST:  100 mL OMNIPAQUE IOHEXOL 300 MG/ML  SOLN  COMPARISON:  CT chest, abdomen and pelvis 04/03/2014. PET CT scan 04/29/2014.  FINDINGS: CT CHEST FINDINGS  Lymphadenopathy in the chest appears improved compared to the prior examinations. A supraclavicular node on the left which had measured 1.4 cm on the prior PET CT today measures 0.7 cm on image 4. A pretracheal node which had measured 1.0 cm on the prior PET CT scan today measures 0.9 cm on image 23. AP window node measuring 1.0 cm also on image 23 is decreased from 2.4 x 1.5 cm on the prior chest CT. No new or enlarging lymph nodes are identified.  The walls of the upper esophagus are thickened. Very small left pleural effusion is noted. Trace amount of pleural fluid is seen on the right. There is also a trace amount of pericardial fluid. Heart size is normal.  Lungs demonstrate severe centrilobular emphysema as on prior examinations. Previously seen left lower lobe mass versus adenopathy in the left hilum is not visible on this examination. Similarly, a lobulated nodule in the posterior right lower lobe seen on the prior CT chest is no longer  visible. Dependent airspace opacity in the lower lobes bilaterally likely reflects compressive atelectasis.  CT ABDOMEN AND PELVIS FINDINGS  Hepatic metastatic deposit which had measured 3.7 x 5.1 cm on the prior PET CT scan today measures  1.8 x 2.0 cm on image 58. A second lesion in the right hepatic lobe which had measured 3.7 x 3.2 cm today measures 1.6 x 1.8 cm on image 63. A third lesion in the inferior right hepatic lobe which had measured 2.9 x 2.8 cm today measures 1.4 x 1.4 cm on image 62. No new or enlarging liver lesions are identified.  The adrenal glands, spleen, pancreas and kidneys all appear normal. The gallbladder is unremarkable. Uterus, adnexa and urinary bladder appear normal. Sigmoid diverticulosis without diverticulitis is identified. Prominent stool burden in the colon is noted. The stomach, small bowel and appendix appear normal. Aortoiliac atherosclerosis without aneurysm is identified.  Metastatic deposit in the left greater trochanter is seen with small areas of cortical disruption along the medial margin of the greater trochanter. No other osseous metastatic disease is visible by CT scan.  IMPRESSION: Improved appearance metastatic lung carcinoma when decreased lymphadenopathy in the chest, improved hepatic metastases and nonvisualization of previously seen lung nodules. Metastatic deposit in left greater trochanter is again seen.  Thickening of the walls of the upper esophagus compatible with esophagitis.  Very small bilateral pleural effusions and pericardial effusion.  Dependent basilar atelectasis.  Extensive emphysematous disease.   Electronically Signed   By: Inge Rise M.D.   On: 07/01/2014 17:30   Mr Brain Wo Contrast  07/01/2014   CLINICAL DATA:  History of stage IV small-cell lung cancer with brain metastases. Admitted for hypoxic respiratory failure.  EXAM: MRI HEAD WITHOUT CONTRAST  TECHNIQUE: Multiplanar, multiecho pulse sequences of the brain and surrounding structures were obtained without intravenous contrast.  COMPARISON:  04/24/2014  FINDINGS: Calvarium and upper cervical spine: No visible skeletal metastases.  Orbits: Bilateral cataract resection. No orbital mass or acute finding.  Sinuses:  Anterior nasal septal perforation. There is mucosal thickening and effusions which have developed bilaterally in the mastoid cavities. No sinus fluid level.  Brain: Known brain metastases are not clearly visualized due to lack of intravenous contrast. Previously seen edema around metastases in the bilateral cerebellum has resolved (patient is on chemotherapy and steroids). There is no evidence of interval hemorrhage. Advanced chronic small vessel disease with ischemic gliosis throughout the bilateral cerebral white matter and diffusely in the pons. Punctate diffusion hyperintensity below the mid right sylvian fissure is chronic compared to the previous study and represents a metastasis. The diffusion signal abnormality is decreased in size, of indeterminate clinical significance. A faint punctate diffusion hyperintensity in the left frontal white matter does not correlate with previously seen enhancing foci and is indeterminate.  No convincing infarct. No hemorrhage, hydrocephalus, or evidence of major vessel occlusion.  IMPRESSION: 1. No acute intracranial findings. 2. Favorable resolution of FLAIR signal abnormality at bilateral cerebellar metastases. Cannot re-stage brain metastases without intravenous contrast. 3. Bilateral mastoiditis, new from February 2016.   Electronically Signed   By: Monte Fantasia M.D.   On: 07/01/2014 11:55   Mr Jeri Cos Contrast  07/02/2014   CLINICAL DATA:  Evaluate for interval improvement in brain metastases, currently undergoing chemotherapy. Patient has lung cancer with both brain and bone metastases.  EXAM: MRI HEAD WITH CONTRAST  TECHNIQUE: Multiplanar, multiecho pulse sequences of the brain and surrounding structures were obtained with intravenous contrast.  COMPARISON:  Noncontrast MRI  brain 07/01/2014. MR brain without and with contrast 04/24/2014.  CONTRAST:  44m MULTIHANCE GADOBENATE DIMEGLUMINE 529 MG/ML IV SOLN  FINDINGS: There is improvement in all previously  visualized metastases, without new lesions. Furthermore, as noted on yesterday's report, there is favorable resolution of FLAIR signal abnormality in the BILATERAL cerebellar metastases.  Faint residual trace is of enhancement are observed, at the following locations, on postcontrast axial series 3:  - image 8, LEFT cerebellum.  - image 9 RIGHT ventral cerebellum.  - image 12, RIGHT lateral cerebellum.  - image 24, RIGHT superior temporal gyrus.  - image 29, RIGHT centrum semiovale.  IMPRESSION: Favorable response to treatment with improvement in all previously visualized metastases, without new lesions.   Electronically Signed   By: JRolla FlattenM.D.   On: 07/02/2014 14:19   Ct Abdomen Pelvis W Contrast  07/01/2014   CLINICAL DATA:  Abdominal distention. History of metastatic lung carcinoma.  EXAM: CT CHEST, ABDOMEN, AND PELVIS WITH CONTRAST  TECHNIQUE: Multidetector CT imaging of the chest, abdomen and pelvis was performed following the standard protocol during bolus administration of intravenous contrast.  CONTRAST:  100 mL OMNIPAQUE IOHEXOL 300 MG/ML  SOLN  COMPARISON:  CT chest, abdomen and pelvis 04/03/2014. PET CT scan 04/29/2014.  FINDINGS: CT CHEST FINDINGS  Lymphadenopathy in the chest appears improved compared to the prior examinations. A supraclavicular node on the left which had measured 1.4 cm on the prior PET CT today measures 0.7 cm on image 4. A pretracheal node which had measured 1.0 cm on the prior PET CT scan today measures 0.9 cm on image 23. AP window node measuring 1.0 cm also on image 23 is decreased from 2.4 x 1.5 cm on the prior chest CT. No new or enlarging lymph nodes are identified.  The walls of the upper esophagus are thickened. Very small left pleural effusion is noted. Trace amount of pleural fluid is seen on the right. There is also a trace amount of pericardial fluid. Heart size is normal.  Lungs demonstrate severe centrilobular emphysema as on prior examinations. Previously seen  left lower lobe mass versus adenopathy in the left hilum is not visible on this examination. Similarly, a lobulated nodule in the posterior right lower lobe seen on the prior CT chest is no longer visible. Dependent airspace opacity in the lower lobes bilaterally likely reflects compressive atelectasis.  CT ABDOMEN AND PELVIS FINDINGS  Hepatic metastatic deposit which had measured 3.7 x 5.1 cm on the prior PET CT scan today measures 1.8 x 2.0 cm on image 58. A second lesion in the right hepatic lobe which had measured 3.7 x 3.2 cm today measures 1.6 x 1.8 cm on image 63. A third lesion in the inferior right hepatic lobe which had measured 2.9 x 2.8 cm today measures 1.4 x 1.4 cm on image 62. No new or enlarging liver lesions are identified.  The adrenal glands, spleen, pancreas and kidneys all appear normal. The gallbladder is unremarkable. Uterus, adnexa and urinary bladder appear normal. Sigmoid diverticulosis without diverticulitis is identified. Prominent stool burden in the colon is noted. The stomach, small bowel and appendix appear normal. Aortoiliac atherosclerosis without aneurysm is identified.  Metastatic deposit in the left greater trochanter is seen with small areas of cortical disruption along the medial margin of the greater trochanter. No other osseous metastatic disease is visible by CT scan.  IMPRESSION: Improved appearance metastatic lung carcinoma when decreased lymphadenopathy in the chest, improved hepatic metastases and nonvisualization of previously seen  lung nodules. Metastatic deposit in left greater trochanter is again seen.  Thickening of the walls of the upper esophagus compatible with esophagitis.  Very small bilateral pleural effusions and pericardial effusion.  Dependent basilar atelectasis.  Extensive emphysematous disease.   Electronically Signed   By: Inge Rise M.D.   On: 07/01/2014 17:30   Ct Abdomen Pelvis W Contrast  06/24/2014   CLINICAL DATA:  Metastatic lung cancer.  New onset of abdominal distention. Increasing shortness of breath.  EXAM: CT ABDOMEN AND PELVIS WITH CONTRAST  TECHNIQUE: Multidetector CT imaging of the abdomen and pelvis was performed using the standard protocol following bolus administration of intravenous contrast.  CONTRAST:  80 cc Omnipaque 300  COMPARISON:  Multiple exams, including 04/30/2014  FINDINGS: Lower chest: Consolidation of much of the left lower lobe and parts of the posterior basal segment right lower lobe concerning for pneumonia or pulmonary hemorrhage. Severe emphysema. Mild distal esophageal wall thickening.  Hepatobiliary: Hypodense metastatic lesions along the falciform ligament and inferiorly in the right hepatic lobe. Index lesion along the falciform ligament measures approximately 2.5 by 1.8 cm. No new liver lesions are identified. The gallbladder is mildly distended. No biliary dilatation observed.  Pancreas: Unremarkable  Spleen: Unremarkable  Adrenals/Urinary Tract: Unremarkable  Stomach/Bowel: There is prominence of stool in the proximal half of the colon but not the distal half. Sigmoid colon diverticulosis. Appendix unremarkable.  Vascular/Lymphatic: Aortoiliac atherosclerotic vascular disease.  Reproductive: Unremarkable  Other: No supplemental non-categorized findings.  Musculoskeletal: Dextroconvex rotary scoliosis. The patient has known osseous metastatic disease for example in the left femur greater trochanter. Bony demineralization is present.  IMPRESSION: 1. Consolidation and much of the left lower lobe and in the posterior basal segment right lower lobe, favoring pneumonia or pulmonary hemorrhage. 2. Hepatic masses compatible with hepatic metastatic disease is, reduced in size over the past 2 months. 3. The patient has known osseous metastatic disease to the left proximal femur. 4. Mild prominence of stool in the proximal half of the colon without obstruction identified. This could reflect low grade constipation. 5. Severe  emphysema. 6. Mild distal esophageal wall thickening -esophagitis not excluded. 7.  Aortoiliac atherosclerotic vascular disease.   Electronically Signed   By: Van Clines M.D.   On: 06/24/2014 07:37   US Abdomen Limited  07/02/2014   CLINICAL DATA:  Abdominal distension, suspect ascites.  EXAM: LIMITED ABDOMEN ULTRASOUND FOR ASCITES  TECHNIQUE: Limited ultrasound survey for ascites was performed in all four abdominal quadrants.  COMPARISON:  CT scan dated Jul 01, 2014  FINDINGS: Interrogation of all 4 quadrants of the abdomen reveals no ascites. A small left pleural effusion is demonstrated.  IMPRESSION: No ascites is demonstrated.  There is a small left pleural effusion.   Electronically Signed   By: David  Martinique M.D.   On: 07/02/2014 16:16   Dg Chest Port 1 View  06/28/2014   CLINICAL DATA:  Acute on chronic respiratory failure with hypoxia. Healthcare associated pneumonia. Metastatic lung carcinoma.  EXAM: PORTABLE CHEST - 1 VIEW  COMPARISON:  06/25/2014  FINDINGS: Right-sided power port remains in appropriate position. Left lower lung infiltrate shows no significant change. Right lung remains clear. Heart size is within normal limits. 6C  IMPRESSION: Mild left lower lobe infiltrate, without significant change.   Electronically Signed   By: Earle Gell M.D.   On: 06/28/2014 09:25   Dg Chest Port 1 View  06/25/2014   CLINICAL DATA:  Shortness of breath.  History of lung cancer.  EXAM: PORTABLE CHEST - 1 VIEW  COMPARISON:  CT chest 04/30/2014. Plain film of the chest 06/24/2014.  FINDINGS: The lungs are emphysematous. Left basilar airspace disease seen on yesterday's examination has improved. The right lung appears clear. Heart size is normal. Port-A-Cath is in place.  IMPRESSION: Improved left basilar airspace disease.  Emphysema.   Electronically Signed   By: Inge Rise M.D.   On: 06/25/2014 07:19   Dg Chest Port 1 View  06/24/2014   CLINICAL DATA:  Acute onset of shortness of breath.  Initial encounter.  EXAM: PORTABLE CHEST - 1 VIEW  COMPARISON:  PET/CT performed 04/30/2014, and CT of the chest performed 04/03/2014  FINDINGS: The lungs are well-aerated. Milder left basilar airspace opacity raises concern for pneumonia. There is no evidence of pleural effusion or pneumothorax.  Known metastatic disease is not well characterized on radiograph. The cardiomediastinal silhouette is within normal limits. No acute osseous abnormalities are seen. A right-sided chest port is noted ending about the distal SVC.  IMPRESSION: Mild left basilar airspace opacity raises concern for pneumonia.   Electronically Signed   By: Garald Balding M.D.   On: 06/24/2014 00:57    CBC  Recent Labs Lab 06/29/14 1130 06/30/14 0532 07/03/14 0412 07/04/14 0401  WBC 35.9* 30.9* 33.9* 33.8*  HGB 9.6* 9.6* 10.7* 10.5*  HCT 29.3* 30.5* 33.7* 33.4*  PLT 165 138* 230 256  MCV 99.7 100.7* 100.6* 100.3*  MCH 32.7 31.7 31.9 31.5  MCHC 32.8 31.5 31.8 31.4  RDW 19.9* 20.5* 21.0* 20.5*    Chemistries   Recent Labs Lab 06/29/14 1130 06/30/14 0532 07/03/14 0412 07/04/14 0401  NA 137 139 137 135  K 5.6* 4.1 3.7 3.7  CL 88* 89* 84* 83*  CO2 39* 39* 38* 42*  GLUCOSE 187* 227* 350* 180*  BUN 13 13 23* 20  CREATININE 0.46 0.41* 0.50 0.51  CALCIUM 9.0 8.4* 9.2 9.0   ------------------------------------------------------------------------------------------------------------------ estimated creatinine clearance is 62.8 mL/min (by C-G formula based on Cr of 0.51). ------------------------------------------------------------------------------------------------------------------ No results for input(s): HGBA1C in the last 72 hours. ------------------------------------------------------------------------------------------------------------------ No results for input(s): CHOL, HDL, LDLCALC, TRIG, CHOLHDL, LDLDIRECT in the last 72  hours. ------------------------------------------------------------------------------------------------------------------ No results for input(s): TSH, T4TOTAL, T3FREE, THYROIDAB in the last 72 hours.  Invalid input(s): FREET3 ------------------------------------------------------------------------------------------------------------------ No results for input(s): VITAMINB12, FOLATE, FERRITIN, TIBC, IRON, RETICCTPCT in the last 72 hours.  Coagulation profile No results for input(s): INR, PROTIME in the last 168 hours.  No results for input(s): DDIMER in the last 72 hours.  Cardiac Enzymes No results for input(s): CKMB, TROPONINI, MYOGLOBIN in the last 168 hours.  Invalid input(s): CK ------------------------------------------------------------------------------------------------------------------ Invalid input(s): [place]  07/02/14 2218 07/03/14 0822 07/03/14 1151 07/03/14 1646 07/03/14 2143 07/04/14 0750  GLUCAP 113* [phone]* 116* 288* 223* [phone]*     RAI,RIPUDEEP M.D. [facility] 07/04/2014, 10:42 AM  Pager: [phone]   Between 7am to 7pm - call Pager - [phone]  After 7pm go to [email or web address] - password [email or web address]  Call night coverage person covering after 7pm

## 2014-07-05 ENCOUNTER — Inpatient Hospital Stay (HOSPITAL_COMMUNITY): Payer: Medicare Other

## 2014-07-05 DIAGNOSIS — I509 Heart failure, unspecified: Secondary | ICD-10-CM

## 2014-07-05 LAB — GLUCOSE, CAPILLARY
GLUCOSE-CAPILLARY: 196 mg/dL — AB (ref 70–99)
Glucose-Capillary: 133 mg/dL — ABNORMAL HIGH (ref 70–99)
Glucose-Capillary: 164 mg/dL — ABNORMAL HIGH (ref 70–99)
Glucose-Capillary: 286 mg/dL — ABNORMAL HIGH (ref 70–99)

## 2014-07-05 LAB — BASIC METABOLIC PANEL
ANION GAP: 10 (ref 5–15)
BUN: 23 mg/dL — AB (ref 6–20)
CALCIUM: 9 mg/dL (ref 8.9–10.3)
CHLORIDE: 84 mmol/L — AB (ref 101–111)
CO2: 39 mmol/L — AB (ref 22–32)
Creatinine, Ser: 0.48 mg/dL (ref 0.44–1.00)
GFR calc Af Amer: >60 mL/min (ref >60)
GFR calc non Af Amer: >60 mL/min (ref >60)
Glucose, Bld: 203 mg/dL — ABNORMAL HIGH (ref 70–99)
Potassium: 3.9 mmol/L (ref 3.5–5.1)
Sodium: 133 mmol/L — ABNORMAL LOW (ref 135–145)

## 2014-07-05 LAB — CBC
HEMATOCRIT: 34.4 % — AB (ref 36.0–46.0)
HEMOGLOBIN: 11 g/dL — AB (ref 12.0–15.0)
MCH: 31.7 pg (ref 26.0–34.0)
MCHC: 32 g/dL (ref 30.0–36.0)
MCV: 99.1 fL (ref 78.0–100.0)
Platelets: 310 10*3/uL (ref 150–400)
RBC: 3.47 MIL/uL — AB (ref 3.87–5.11)
RDW: 20.3 % — ABNORMAL HIGH (ref 11.5–15.5)
WBC: 36.2 10*3/uL — ABNORMAL HIGH (ref 4.0–10.5)

## 2014-07-05 NOTE — Clinical Social Work Placement (Addendum)
   CLINICAL SOCIAL WORK PLACEMENT  NOTE  Date:  07/05/2014  Patient Details  Name: Alexa Stephens MRN: [id number] Date of Birth: 1950/09/19  Clinical Social Work is seeking post-discharge placement for this patient at the [place] level of care (*CSW will initial, date and re-position this form in  chart as items are completed):  Yes   Patient/family provided with [place] Work Department's list of facilities offering this level of care within the geographic area requested by the patient (or if unable, by the patient's family).  Yes   Patient/family informed of their freedom to choose among providers that offer the needed level of care, that participate in Medicare, Medicaid or managed care program needed by the patient, have an available bed and are willing to accept the patient.  Yes   Patient/family informed of [facility]'s ownership interest in [facility] and [facility], as well as of the fact that they are under no obligation to receive care at these facilities.  PASRR submitted to EDS on 06/29/14     PASRR number received on 06/29/14     Existing PASRR number confirmed on       FL2 transmitted to all facilities in geographic area requested by pt/family on 07/01/14 Evette Cristal, [place] 07/06/14)     FL2 transmitted to all facilities within larger geographic area on       Patient informed that his/her managed care company has contracts with or will negotiate with certain facilities, including the following:        Yes   Patient/family informed of bed offers received.  Patient chooses bed at [facility] (May change mind prior to d/c)     Physician recommends and patient chooses bed at      Patient to be transferred to [facility] on .  Patient to be transferred to facility by [facility] on 07/06/14 Evette Cristal, [place] 07/06/14)  Patient family notified on 07/06/14  of transfer. Randall Hiss [place], [place] 07/06/14)  Name of family member notified:   Patient's son [name] [place]ian [place] 07/06/14)     PHYSICIAN Please sign FL2, Please prepare prescriptions, Please sign DNR     Additional Comment:    07/05/14  Planned DC to SNF 07/06/14. Son is still considering bed offers but is currently leaning towards accepting bed at [facility]. CSW to follow up with him in the morning to determine final choice.  Lorie Phenix. Murrell Redden [phone] (weekend coverage)  Patient accepted [facility] [address] [place], [place] 07/06/14)   _______________________________________________ Williemae Area, LCSW 07/05/2014, 10:34 AM

## 2014-07-05 NOTE — Progress Notes (Signed)
[facility]                                                                              Patient Demographics  Alexa Stephens, is a 64 y.o. female, DOB - 10/14/1950, YKZ:[id number]  Admit date - 06/23/2014   Admitting Physician Alexa Lever, MD  Outpatient Primary MD for the patient is Alexa Pacas, DO  LOS - 11   Chief Complaint  Patient presents with  . Shortness of Breath       Brief HPI   64 y.o. female with history of stage 4 small cell lung cancer, chronic resp failure on 3-4L O2 at home, COPD, HTN, and anxietywho presented with acute on chronic resp failure with hypoxia. Pt reported the acute onset of cough over a few days with progressive SOB. Actively receiving chemotherapy for small cell lung cancer. Followed by pulm outpt and [facility], Dr. Julien Stephens.In the ER T 98.4 heart rate in the 110s to 120s, respirations 20s, blood pressure in the 120s to 160s. Satting in the mid 90s with nonrebreather. White blood cell count 7.1, hemoglobin 11.4, creatinine 0.36. Chest x-ray noted mild left basilar airspace opacity concerning for pneumonia.    Assessment & Plan    Principal Problem:   Acute on chronic respiratory failure with hypoxia likely due to left lung pneumonia in the setting of lung cancer, stage IV - Slightly better today, sitting up in the chair, O2 sats 94% on 4 L, discontinued IV vancomycin and Zosyn yesterday, completed 10 days, for now placed on oral levofloxacin for total of 4 more days - Long discussion with patient's family and palliative medicine, they requested to obtain CT of the chest, abdomen and pelvis, MRI of the brain as was recommended by Dr. Earlie Stephens. She was to have these imaging on 4/29 however patient was admitted on 4/27. Patient's son requested not to make any decisions regarding palliative care until the imaging results are available if there is any hope for chemotherapy or further treatment. - No ascites on the  abdominal ultrasound - MRI of the brain showed favorable response to treatment with improvement in all previously visualized metastasis with no new lesions. Per patient's family, son, Alexa Stephens, wishes to continue current management and chemotherapy, plan skilled nursing facility for rehab  - Continue IV Lasix, albumin level III.3, BNP 335, 2-D echo pending    Active Problems:   Abdominal distension: ? Constipation, patient couldn't remember her last BM -CT chest, abdomen and pelvis showed esophagitis, very small bilateral pleural effusions and pericardial effusion, hepatic metastatic deposits, metastatic deposit in the left greater trochanter.  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  MRI of the brain showed favorable response to treatment with improvement in all previously visualized metastasis and no new lesions  - Family wants to continue current treatment and once she is recovered follow-up with oncology  for chemotherapy.  - Appreciate palliative medicine assistance  Generalized debility - PT OT recommended skilled nursing facility    Code Status: DO NOT RESUSCITATE   Family Communication: Discussed in detail with the patient, all imaging results, lab results explained to the patient   Disposition Plan: Skilled nursing facility likely on Monday  Time Spent in minutes  25  minutes  Procedures  MRI brain  Consults   none  DVT Prophylaxis  heparin subcutaneous  Medications  Scheduled Meds: . dexamethasone  4 mg Oral BID  . docusate sodium  200 mg Oral BID  . furosemide  60 mg Intravenous BID  . heparin  5,000 Units Subcutaneous 3 times per day  . insulin aspart  0-5 Units Subcutaneous QHS   . insulin aspart  0-9 Units Subcutaneous TID WC  . ipratropium-albuterol  3 mL Inhalation TID  . levofloxacin  750 mg Oral Daily  . metoprolol tartrate  25 mg Oral BID  . pantoprazole  40 mg Oral Daily  . polyethylene glycol  17 g Oral BID  . salmeterol  1 puff Inhalation BID  . senna  1 tablet Oral BID  . simvastatin  20 mg Oral Daily   Continuous Infusions:  PRN Meds:.bisacodyl, LORazepam, morphine CONCENTRATE, oxyCODONE, polyethylene glycol   Antibiotics   Anti-infectives    Start     Dose/Rate Route Frequency Ordered Stop   07/04/14 1000  levofloxacin (LEVAQUIN) tablet 750 mg     750 mg Oral Daily 07/03/14 1734     07/01/14 2000  vancomycin (VANCOCIN) IVPB 1000 mg/200 mL premix     1,000 mg 200 mL/hr over 60 Minutes Intravenous Every 12 hours 07/01/14 0912 07/03/14 2151   06/28/14 0526  vancomycin (VANCOCIN) IVPB 1000 mg/200 mL premix  Status:  Discontinued     1,000 mg 200 mL/hr over 60 Minutes Intravenous Every 24 hours 06/27/14 1316 07/01/14 0912   06/24/14 1200  piperacillin-tazobactam (ZOSYN) IVPB 3.375 g     3.375 g 12.5 mL/hr over 240 Minutes Intravenous Every 8 hours 06/24/14 0530 07/04/14 0051   06/24/14 0600  vancomycin (VANCOCIN) 500 mg in sodium chloride 0.9 % 100 mL IVPB  Status:  Discontinued     500 mg 100 mL/hr over 60 Minutes Intravenous Every 12 hours 06/24/14 0530 06/27/14 1316   06/24/14 0545  piperacillin-tazobactam (ZOSYN) IVPB 3.375 g     3.375 g 100 mL/hr over 30 Minutes Intravenous  Once 06/24/14 0530 06/24/14 0646   06/24/14 0215  cefTRIAXone (ROCEPHIN) 1 g in dextrose 5 % 50 mL IVPB  Status:  Discontinued     1 g 100 mL/hr over 30 Minutes Intravenous Every 24 hours 06/24/14 0202 06/24/14 0514   06/24/14 0215  azithromycin (ZITHROMAX) 500 mg in dextrose 5 % 250 mL IVPB  Status:  Discontinued     500 mg 250 mL/hr over 60 Minutes Intravenous Every 24 hours 06/24/14 0202 06/24/14 0514   06/24/14 0045  cefTRIAXone (ROCEPHIN) 1 g in dextrose 5 % 50  mL IVPB     1 g 100 mL/hr over 30 Minutes Intravenous  Once 06/24/14 0042 06/24/14 0214   06/24/14 0045  azithromycin (ZITHROMAX) 500 mg in dextrose 5 % 250 mL IVPB     500 mg 250 mL/hr over 60 Minutes Intravenous  Once 06/24/14 0042 06/24/14 0214        Subjective:   Alexa Stephens was seen and examined today. Patient seen and examined, denies any chest pain, abdominal pain, nausea or vomiting.  Shortness of breath stable not any worse per patient. Appears to be in little better spirits today   Objective:   Blood pressure 113/66, pulse 102, temperature 97.9 F (36.6 C), temperature source Oral, resp. rate 20, height '5\' 1"'$  (1.549 m), weight 70.1 kg (154 lb 8.7 oz), SpO2 98 %.  Wt Readings from Last 3 Encounters:  06/27/14 70.1 kg (154 lb 8.7 oz)  06/01/14 46.947 kg (103 lb 8 oz)  05/14/14 45.813 kg (101 lb)     Intake/Output Summary (Last 24 hours) at 07/05/14 0942 Last data filed at 07/05/14 0600  Gross per 24 hour  Intake    560 ml  Output      0 ml  Net    560 ml    Exam  General: Alert and oriented x 3, NAD  HEENT:  PERRLA, EOMI,  Neck: Supple, no JVD,   CVS: S1 S2 clear, no mrg  Respiratory: Decreased breath sounds at the bases otherwise no rhonchi or wheezing  Abdomen: Soft, NT, ND, NBS  Ext: no cyanosis clubbing or edema  Neuro; no new deficits  Skin: No rashes  Psych: Normal affect and demeanor, alert and oriented x3    Data Review   Micro Results No results found for this or any previous visit (from the past 240 hour(s)).  Radiology Reports Ct Chest W Contrast  07/01/2014   CLINICAL DATA:  Abdominal distention. History of metastatic lung carcinoma.  EXAM: CT CHEST, ABDOMEN, AND PELVIS WITH CONTRAST  TECHNIQUE: Multidetector CT imaging of the chest, abdomen and pelvis was performed following the standard protocol during bolus administration of intravenous contrast.  CONTRAST:  100 mL OMNIPAQUE IOHEXOL 300 MG/ML  SOLN  COMPARISON:  CT chest, abdomen  and pelvis 04/03/2014. PET CT scan 04/29/2014.  FINDINGS: CT CHEST FINDINGS  Lymphadenopathy in the chest appears improved compared to the prior examinations. A supraclavicular node on the left which had measured 1.4 cm on the prior PET CT today measures 0.7 cm on image 4. A pretracheal node which had measured 1.0 cm on the prior PET CT scan today measures 0.9 cm on image 23. AP window node measuring 1.0 cm also on image 23 is decreased from 2.4 x 1.5 cm on the prior chest CT. No new or enlarging lymph nodes are identified.  The walls of the upper esophagus are thickened. Very small left pleural effusion is noted. Trace amount of pleural fluid is seen on the right. There is also a trace amount of pericardial fluid. Heart size is normal.  Lungs demonstrate severe centrilobular emphysema as on prior examinations. Previously seen left lower lobe mass versus adenopathy in the left hilum is not visible on this examination. Similarly, a lobulated nodule in the posterior right lower lobe seen on the prior CT chest is no longer visible. Dependent airspace opacity in the lower lobes bilaterally likely reflects compressive atelectasis.  CT ABDOMEN AND PELVIS FINDINGS  Hepatic metastatic deposit which had measured 3.7 x 5.1 cm on the prior PET CT scan today measures 1.8 x 2.0 cm on image 58. A second lesion in the right hepatic lobe which had measured 3.7 x 3.2 cm today measures 1.6 x 1.8 cm on image 63. A third lesion in the inferior right hepatic lobe which had measured 2.9 x 2.8 cm today measures 1.4 x 1.4 cm on image 62. No new or enlarging liver lesions are identified.  The adrenal glands, spleen, pancreas and kidneys all appear normal. The gallbladder is unremarkable. Uterus, adnexa  and urinary bladder appear normal. Sigmoid diverticulosis without diverticulitis is identified. Prominent stool burden in the colon is noted. The stomach, small bowel and appendix appear normal. Aortoiliac atherosclerosis without aneurysm is  identified.  Metastatic deposit in the left greater trochanter is seen with small areas of cortical disruption along the medial margin of the greater trochanter. No other osseous metastatic disease is visible by CT scan.  IMPRESSION: Improved appearance metastatic lung carcinoma when decreased lymphadenopathy in the chest, improved hepatic metastases and nonvisualization of previously seen lung nodules. Metastatic deposit in left greater trochanter is again seen.  Thickening of the walls of the upper esophagus compatible with esophagitis.  Very small bilateral pleural effusions and pericardial effusion.  Dependent basilar atelectasis.  Extensive emphysematous disease.   Electronically Signed   By: Inge Rise M.D.   On: 07/01/2014 17:30   Mr Brain Wo Contrast  07/01/2014   CLINICAL DATA:  History of stage IV small-cell lung cancer with brain metastases. Admitted for hypoxic respiratory failure.  EXAM: MRI HEAD WITHOUT CONTRAST  TECHNIQUE: Multiplanar, multiecho pulse sequences of the brain and surrounding structures were obtained without intravenous contrast.  COMPARISON:  04/24/2014  FINDINGS: Calvarium and upper cervical spine: No visible skeletal metastases.  Orbits: Bilateral cataract resection. No orbital mass or acute finding.  Sinuses: Anterior nasal septal perforation. There is mucosal thickening and effusions which have developed bilaterally in the mastoid cavities. No sinus fluid level.  Brain: Known brain metastases are not clearly visualized due to lack of intravenous contrast. Previously seen edema around metastases in the bilateral cerebellum has resolved (patient is on chemotherapy and steroids). There is no evidence of interval hemorrhage. Advanced chronic small vessel disease with ischemic gliosis throughout the bilateral cerebral white matter and diffusely in the pons. Punctate diffusion hyperintensity below the mid right sylvian fissure is chronic compared to the previous study and  represents a metastasis. The diffusion signal abnormality is decreased in size, of indeterminate clinical significance. A faint punctate diffusion hyperintensity in the left frontal white matter does not correlate with previously seen enhancing foci and is indeterminate.  No convincing infarct. No hemorrhage, hydrocephalus, or evidence of major vessel occlusion.  IMPRESSION: 1. No acute intracranial findings. 2. Favorable resolution of FLAIR signal abnormality at bilateral cerebellar metastases. Cannot re-stage brain metastases without intravenous contrast. 3. Bilateral mastoiditis, new from February 2016.   Electronically Signed   By: Monte Fantasia M.D.   On: 07/01/2014 11:55   Mr Jeri Cos Contrast  07/02/2014   CLINICAL DATA:  Evaluate for interval improvement in brain metastases, currently undergoing chemotherapy. Patient has lung cancer with both brain and bone metastases.  EXAM: MRI HEAD WITH CONTRAST  TECHNIQUE: Multiplanar, multiecho pulse sequences of the brain and surrounding structures were obtained with intravenous contrast.  COMPARISON:  Noncontrast MRI brain 07/01/2014. MR brain without and with contrast 04/24/2014.  CONTRAST:  30m MULTIHANCE GADOBENATE DIMEGLUMINE 529 MG/ML IV SOLN  FINDINGS: There is improvement in all previously visualized metastases, without new lesions. Furthermore, as noted on yesterday's report, there is favorable resolution of FLAIR signal abnormality in the BILATERAL cerebellar metastases.  Faint residual trace is of enhancement are observed, at the following locations, on postcontrast axial series 3:  - image 8, LEFT cerebellum.  - image 9 RIGHT ventral cerebellum.  - image 12, RIGHT lateral cerebellum.  - image 24, RIGHT superior temporal gyrus.  - image 29, RIGHT centrum semiovale.  IMPRESSION: Favorable response to treatment with improvement in all previously visualized metastases, without  new lesions.   Electronically Signed   By: Rolla Flatten M.D.   On: 07/02/2014  14:19   Ct Abdomen Pelvis W Contrast  07/01/2014   CLINICAL DATA:  Abdominal distention. History of metastatic lung carcinoma.  EXAM: CT CHEST, ABDOMEN, AND PELVIS WITH CONTRAST  TECHNIQUE: Multidetector CT imaging of the chest, abdomen and pelvis was performed following the standard protocol during bolus administration of intravenous contrast.  CONTRAST:  100 mL OMNIPAQUE IOHEXOL 300 MG/ML  SOLN  COMPARISON:  CT chest, abdomen and pelvis 04/03/2014. PET CT scan 04/29/2014.  FINDINGS: CT CHEST FINDINGS  Lymphadenopathy in the chest appears improved compared to the prior examinations. A supraclavicular node on the left which had measured 1.4 cm on the prior PET CT today measures 0.7 cm on image 4. A pretracheal node which had measured 1.0 cm on the prior PET CT scan today measures 0.9 cm on image 23. AP window node measuring 1.0 cm also on image 23 is decreased from 2.4 x 1.5 cm on the prior chest CT. No new or enlarging lymph nodes are identified.  The walls of the upper esophagus are thickened. Very small left pleural effusion is noted. Trace amount of pleural fluid is seen on the right. There is also a trace amount of pericardial fluid. Heart size is normal.  Lungs demonstrate severe centrilobular emphysema as on prior examinations. Previously seen left lower lobe mass versus adenopathy in the left hilum is not visible on this examination. Similarly, a lobulated nodule in the posterior right lower lobe seen on the prior CT chest is no longer visible. Dependent airspace opacity in the lower lobes bilaterally likely reflects compressive atelectasis.  CT ABDOMEN AND PELVIS FINDINGS  Hepatic metastatic deposit which had measured 3.7 x 5.1 cm on the prior PET CT scan today measures 1.8 x 2.0 cm on image 58. A second lesion in the right hepatic lobe which had measured 3.7 x 3.2 cm today measures 1.6 x 1.8 cm on image 63. A third lesion in the inferior right hepatic lobe which had measured 2.9 x 2.8 cm today measures  1.4 x 1.4 cm on image 62. No new or enlarging liver lesions are identified.  The adrenal glands, spleen, pancreas and kidneys all appear normal. The gallbladder is unremarkable. Uterus, adnexa and urinary bladder appear normal. Sigmoid diverticulosis without diverticulitis is identified. Prominent stool burden in the colon is noted. The stomach, small bowel and appendix appear normal. Aortoiliac atherosclerosis without aneurysm is identified.  Metastatic deposit in the left greater trochanter is seen with small areas of cortical disruption along the medial margin of the greater trochanter. No other osseous metastatic disease is visible by CT scan.  IMPRESSION: Improved appearance metastatic lung carcinoma when decreased lymphadenopathy in the chest, improved hepatic metastases and nonvisualization of previously seen lung nodules. Metastatic deposit in left greater trochanter is again seen.  Thickening of the walls of the upper esophagus compatible with esophagitis.  Very small bilateral pleural effusions and pericardial effusion.  Dependent basilar atelectasis.  Extensive emphysematous disease.   Electronically Signed   By: Inge Rise M.D.   On: 07/01/2014 17:30   Ct Abdomen Pelvis W Contrast  06/24/2014   CLINICAL DATA:  Metastatic lung cancer. New onset of abdominal distention. Increasing shortness of breath.  EXAM: CT ABDOMEN AND PELVIS WITH CONTRAST  TECHNIQUE: Multidetector CT imaging of the abdomen and pelvis was performed using the standard protocol following bolus administration of intravenous contrast.  CONTRAST:  80 cc Omnipaque 300  COMPARISON:  Multiple exams, including 04/30/2014  FINDINGS: Lower chest: Consolidation of much of the left lower lobe and parts of the posterior basal segment right lower lobe concerning for pneumonia or pulmonary hemorrhage. Severe emphysema. Mild distal esophageal wall thickening.  Hepatobiliary: Hypodense metastatic lesions along the falciform ligament and  inferiorly in the right hepatic lobe. Index lesion along the falciform ligament measures approximately 2.5 by 1.8 cm. No new liver lesions are identified. The gallbladder is mildly distended. No biliary dilatation observed.  Pancreas: Unremarkable  Spleen: Unremarkable  Adrenals/Urinary Tract: Unremarkable  Stomach/Bowel: There is prominence of stool in the proximal half of the colon but not the distal half. Sigmoid colon diverticulosis. Appendix unremarkable.  Vascular/Lymphatic: Aortoiliac atherosclerotic vascular disease.  Reproductive: Unremarkable  Other: No supplemental non-categorized findings.  Musculoskeletal: Dextroconvex rotary scoliosis. The patient has known osseous metastatic disease for example in the left femur greater trochanter. Bony demineralization is present.  IMPRESSION: 1. Consolidation and much of the left lower lobe and in the posterior basal segment right lower lobe, favoring pneumonia or pulmonary hemorrhage. 2. Hepatic masses compatible with hepatic metastatic disease is, reduced in size over the past 2 months. 3. The patient has known osseous metastatic disease to the left proximal femur. 4. Mild prominence of stool in the proximal half of the colon without obstruction identified. This could reflect low grade constipation. 5. Severe emphysema. 6. Mild distal esophageal wall thickening -esophagitis not excluded. 7.  Aortoiliac atherosclerotic vascular disease.   Electronically Signed   By: Van Clines M.D.   On: 06/24/2014 07:37   US Abdomen Limited  07/02/2014   CLINICAL DATA:  Abdominal distension, suspect ascites.  EXAM: LIMITED ABDOMEN ULTRASOUND FOR ASCITES  TECHNIQUE: Limited ultrasound survey for ascites was performed in all four abdominal quadrants.  COMPARISON:  CT scan dated Jul 01, 2014  FINDINGS: Interrogation of all 4 quadrants of the abdomen reveals no ascites. A small left pleural effusion is demonstrated.  IMPRESSION: No ascites is demonstrated.  There is a small  left pleural effusion.   Electronically Signed   By: David  Martinique M.D.   On: 07/02/2014 16:16   Dg Chest Port 1 View  06/28/2014   CLINICAL DATA:  Acute on chronic respiratory failure with hypoxia. Healthcare associated pneumonia. Metastatic lung carcinoma.  EXAM: PORTABLE CHEST - 1 VIEW  COMPARISON:  06/25/2014  FINDINGS: Right-sided power port remains in appropriate position. Left lower lung infiltrate shows no significant change. Right lung remains clear. Heart size is within normal limits. 6C  IMPRESSION: Mild left lower lobe infiltrate, without significant change.   Electronically Signed   By: Earle Gell M.D.   On: 06/28/2014 09:25   Dg Chest Port 1 View  06/25/2014   CLINICAL DATA:  Shortness of breath.  History of lung cancer.  EXAM: PORTABLE CHEST - 1 VIEW  COMPARISON:  CT chest 04/30/2014. Plain film of the chest 06/24/2014.  FINDINGS: The lungs are emphysematous. Left basilar airspace disease seen on yesterday's examination has improved. The right lung appears clear. Heart size is normal. Port-A-Cath is in place.  IMPRESSION: Improved left basilar airspace disease.  Emphysema.   Electronically Signed   By: Inge Rise M.D.   On: 06/25/2014 07:19   Dg Chest Port 1 View  06/24/2014   CLINICAL DATA:  Acute onset of shortness of breath. Initial encounter.  EXAM: PORTABLE CHEST - 1 VIEW  COMPARISON:  PET/CT performed 04/30/2014, and CT of the chest performed 04/03/2014  FINDINGS: The lungs are well-aerated.  Milder left basilar airspace opacity raises concern for pneumonia. There is no evidence of pleural effusion or pneumothorax.  Known metastatic disease is not well characterized on radiograph. The cardiomediastinal silhouette is within normal limits. No acute osseous abnormalities are seen. A right-sided chest port is noted ending about the distal SVC.  IMPRESSION: Mild left basilar airspace opacity raises concern for pneumonia.   Electronically Signed   By: Garald Balding M.D.   On: 06/24/2014  00:57    CBC  Recent Labs Lab 06/29/14 1130 06/30/14 0532 07/03/14 0412 07/04/14 0401 07/05/14 0354  WBC 35.9* 30.9* 33.9* 33.8* 36.2*  HGB 9.6* 9.6* 10.7* 10.5* 11.0*  HCT 29.3* 30.5* 33.7* 33.4* 34.4*  PLT 165 138* 230 256 310  MCV 99.7 100.7* 100.6* 100.3* 99.1  MCH 32.7 31.7 31.9 31.5 31.7  MCHC 32.8 31.5 31.8 31.4 32.0  RDW 19.9* 20.5* 21.0* 20.5* 20.3*    Chemistries   Recent Labs Lab 06/29/14 1130 06/30/14 0532 07/03/14 0412 07/04/14 0401 07/05/14 0354  NA 137 139 137 135 133*  K 5.6* 4.1 3.7 3.7 3.9  CL 88* 89* 84* 83* 84*  CO2 39* 39* 38* 42* 39*  GLUCOSE 187* 227* 350* 180* 203*  BUN 13 13 23* 20 23*  CREATININE 0.46 0.41* 0.50 0.51 0.48  CALCIUM 9.0 8.4* 9.2 9.0 9.0   ------------------------------------------------------------------------------------------------------------------ estimated creatinine clearance is 62.8 mL/min (by C-G formula based on Cr of 0.48). ------------------------------------------------------------------------------------------------------------------ No results for input(s): HGBA1C in the last 72 hours. ------------------------------------------------------------------------------------------------------------------ No results for input(s): CHOL, HDL, LDLCALC, TRIG, CHOLHDL, LDLDIRECT in the last 72 hours. ------------------------------------------------------------------------------------------------------------------ No results for input(s): TSH, T4TOTAL, T3FREE, THYROIDAB in the last 72 hours.  Invalid input(s): FREET3 ------------------------------------------------------------------------------------------------------------------ No results for input(s): VITAMINB12, FOLATE, FERRITIN, TIBC, IRON, RETICCTPCT in the last 72 hours.  Coagulation profile No results for input(s): INR, PROTIME in the last 168 hours.  No results for input(s): DDIMER in the last 72 hours.  Cardiac Enzymes No results for input(s): CKMB,  TROPONINI, MYOGLOBIN in the last 168 hours.  Invalid input(s): CK ------------------------------------------------------------------------------------------------------------------ Invalid input(s): Coleta  07/03/14 2143 07/04/14 0750 07/04/14 1211 07/04/14 1706 07/04/14 2147 07/05/14 0806  GLUCAP 223* 189* 180* 229* 116* 196*     Caron Ode M.D. [facility] 07/05/2014, 9:42 AM  Pager: [phone]   Between 7am to 7pm - call Pager - [phone]  After 7pm go to [email or web address] - password [email or web address]  Call night coverage person covering after 7pm

## 2014-07-05 NOTE — Progress Notes (Signed)
  Echocardiogram 2D Echocardiogram has been performed.  Johny Chess 07/05/2014, 11:03 AM

## 2014-07-05 NOTE — Progress Notes (Signed)
CSW spoke with patient's son Cindee Mclester this morning re: bed choices.  He stated that he has visited 3 of the 6 facilities that provided bed offers and at this time is leaning towards accepting a bed at [facility]. Per MD note- d/c to SNF is anticipated tomorrow and son was notified of this.  He wants to continue to "think " about choices and asked that CSW contact him tomorrow morning for final choice. Palliative Care services can follow patient at the SNF if needed. Lorie Phenix. Pauline Good, Shar[place] (weekend coverage)

## 2014-07-06 ENCOUNTER — Ambulatory Visit: Payer: Medicare Other | Admitting: Physician Assistant

## 2014-07-06 ENCOUNTER — Other Ambulatory Visit: Payer: Medicare Other

## 2014-07-06 ENCOUNTER — Ambulatory Visit: Payer: Medicare Other

## 2014-07-06 LAB — GLUCOSE, CAPILLARY
GLUCOSE-CAPILLARY: 249 mg/dL — AB (ref 70–99)
Glucose-Capillary: 168 mg/dL — ABNORMAL HIGH (ref 70–99)
Glucose-Capillary: 166 mg/dL — ABNORMAL HIGH (ref 70–99)

## 2014-07-06 MED ORDER — FUROSEMIDE 20 MG PO TABS: 40.0000 mg | ORAL_TABLET | Freq: Two times a day (BID) | ORAL | Status: DC

## 2014-07-06 MED ORDER — CLONAZEPAM 1 MG PO TABS: 1.0000 mg | ORAL_TABLET | Freq: Two times a day (BID) | ORAL | Status: AC | PRN

## 2014-07-06 MED ORDER — METOPROLOL TARTRATE 25 MG PO TABS: 25.0000 mg | ORAL_TABLET | Freq: Two times a day (BID) | ORAL | Status: AC

## 2014-07-06 MED ORDER — OXYCODONE HCL 5 MG PO TABS: 5.0000 mg | ORAL_TABLET | ORAL | Status: DC | PRN

## 2014-07-06 MED ORDER — MORPHINE SULFATE (CONCENTRATE) 10 MG/0.5ML PO SOLN: 5.0000 mg | ORAL | Status: AC | PRN

## 2014-07-06 MED ORDER — POLYETHYLENE GLYCOL 3350 17 G PO PACK: 17.0000 g | PACK | Freq: Two times a day (BID) | ORAL | Status: AC

## 2014-07-06 MED ORDER — FUROSEMIDE 20 MG PO TABS: 60.0000 mg | ORAL_TABLET | Freq: Two times a day (BID) | ORAL | Status: AC

## 2014-07-06 MED ORDER — FUROSEMIDE 40 MG PO TABS
60.0000 mg | ORAL_TABLET | Freq: Two times a day (BID) | ORAL | Status: DC
  Administered 2014-07-06: 60 mg via ORAL
  Filled 2014-07-06: qty 1

## 2014-07-06 MED ORDER — SENNA 8.6 MG PO TABS: 1.0000 | ORAL_TABLET | Freq: Two times a day (BID) | ORAL | Status: AC

## 2014-07-06 MED ORDER — INSULIN ASPART 100 UNIT/ML ~~LOC~~ SOLN: 0.0000 [IU] | Freq: Three times a day (TID) | SUBCUTANEOUS | Status: AC

## 2014-07-06 MED ORDER — FUROSEMIDE 40 MG PO TABS: 40.0000 mg | ORAL_TABLET | Freq: Two times a day (BID) | ORAL | Status: DC

## 2014-07-06 MED ORDER — OXYCODONE HCL 5 MG PO TABS: 5.0000 mg | ORAL_TABLET | ORAL | Status: AC | PRN

## 2014-07-06 MED ORDER — BISACODYL 10 MG RE SUPP: 10.0000 mg | Freq: Every day | RECTAL | Status: AC | PRN

## 2014-07-06 MED ORDER — ONDANSETRON HCL 4 MG/2ML IJ SOLN
4.0000 mg | Freq: Once | INTRAMUSCULAR | Status: AC
  Administered 2014-07-06: 4 mg via INTRAVENOUS
  Filled 2014-07-06: qty 2

## 2014-07-06 MED ORDER — DOCUSATE SODIUM 100 MG PO CAPS: 200.0000 mg | ORAL_CAPSULE | Freq: Two times a day (BID) | ORAL | Status: AC

## 2014-07-06 MED ORDER — SALMETEROL XINAFOATE 50 MCG/DOSE IN AEPB: 1.0000 | INHALATION_SPRAY | Freq: Two times a day (BID) | RESPIRATORY_TRACT | Status: AC

## 2014-07-06 MED ORDER — IPRATROPIUM-ALBUTEROL 0.5-2.5 (3) MG/3ML IN SOLN: 3.0000 mL | Freq: Four times a day (QID) | RESPIRATORY_TRACT | Status: DC | PRN

## 2014-07-06 MED ORDER — LEVOFLOXACIN 750 MG PO TABS: 750.0000 mg | ORAL_TABLET | Freq: Every day | ORAL | Status: AC

## 2014-07-06 NOTE — Discharge Summary (Signed)
Physician Discharge Summary   Patient ID: Alexa Stephens MRN: [id number] DOB/AGE: [birth year] 64 y.o.  Admit date: 06/23/2014 Discharge date: 07/06/2014  Primary Care Physician:  [name], DO  Discharge Diagnoses:    . Acute on chronic respiratory failure with hypoxia . constipation  . HCAP (healthcare-associated pneumonia) . Respiratory acidosis . Metastatic lung cancer (metastasis from lung to other site) . Brain metastases . Metastases to the liver . Bone metastases . Essential hypertension . Sinus tachycardia . constipation   Consults:  Palliative medicine   Recommendations for Outpatient Follow-up:  Please check BMET in 3 days, 07/09/14. May decrease Lasix to 40 mg twice a day on 07/09/14.   Palliative care care to follow at the nursing facility  TESTS THAT NEED FOLLOW-UP BMET    DIET: Carb modified diet while on dexamethasone  Allergies:   Allergies  Allergen Reactions  . Benadryl [Diphenhydramine]     hyperactive  . Compazine [Prochlorperazine Edisylate] Other (See Comments)    Didn't work for her  . Excedrin Extra Strength [Aspirin-Acetaminophen-Caffeine] Other (See Comments)    "kidney aches"  . Flexeril [Cyclobenzaprine] Other (See Comments)    SKIN NUMB  . Lexapro [Escitalopram] Other (See Comments)    Gi upset  . Naproxen Other (See Comments)    High BP  . Skelaxin [Metaxalone] Other (See Comments)    DIDN'T WORK  . Tylenol [Acetaminophen] Other (See Comments)    Difficulty breathing     Discharge Medications:   Medication List    STOP taking these medications        fluconazole 100 MG tablet  Commonly known as:  DIFLUCAN     Fluticasone Furoate-Vilanterol 100-25 MCG/INH Aepb  Commonly known as:  BREO ELLIPTA     HYDROcodone-homatropine 5-1.5 MG/5ML syrup  Commonly known as:  HYCODAN     lisinopril 20 MG tablet  Commonly known as:  PRINIVIL,ZESTRIL     valACYclovir 1000 MG tablet  Commonly known as:  VALTREX      TAKE these  medications        albuterol (2.5 MG/3ML) 0.083% nebulizer solution  Commonly known as:  PROVENTIL  Inhale 3 mLs into the lungs every 6 (six) hours as needed for wheezing or shortness of breath.     albuterol 108 (90 BASE) MCG/ACT inhaler  Commonly known as:  PROAIR HFA  Inhale 2 puffs into the lungs every 6 (six) hours as needed for wheezing or shortness of breath.     bisacodyl 10 MG suppository  Commonly known as:  DULCOLAX  Place 1 suppository (10 mg total) rectally daily as needed for mild constipation.     clonazePAM 1 MG tablet  Commonly known as:  KLONOPIN  Take 1 tablet (1 mg total) by mouth 2 (two) times daily as needed. for anxiety     dexamethasone 4 MG tablet  Commonly known as:  DECADRON  Take 1 tablet (4 mg total) by mouth 2 (two) times daily.     docusate sodium 100 MG capsule  Commonly known as:  COLACE  Take 2 capsules (200 mg total) by mouth 2 (two) times daily.     furosemide 20 MG tablet  Commonly known as:  LASIX  Take 3 tablets (60 mg total) by mouth 2 (two) times daily.     insulin aspart 100 UNIT/ML injection  Commonly known as:  novoLOG  Inject 0-9 Units into the skin 3 (three) times daily with meals. Sliding scale CBG 70 - 120: 0 units CBG  121 - 150: 1 unit,  CBG 151 - 200: 2 units,  CBG 201 - 250: 3 units,  CBG 251 - 300: 5 units,  CBG 301 - 350: 7 units,  CBG 351 - 400: 9 units   CBG > 400: 9 units and notify your MD     ipratropium 0.02 % nebulizer solution  Commonly known as:  ATROVENT  Take 2.5 mLs (500 mcg total) by nebulization every 6 (six) hours as needed (ICD10: J44.9).     Ipratropium-Albuterol 20-100 MCG/ACT Aers respimat  Commonly known as:  COMBIVENT RESPIMAT  INHALE 1 PUFF BY MOUTH EVERY 6 HOURS.     levofloxacin 750 MG tablet  Commonly known as:  LEVAQUIN  Take 1 tablet (750 mg total) by mouth daily. X 3days     lidocaine-prilocaine cream  Commonly known as:  EMLA  Apply 1 application topically as needed.     metoprolol  tartrate 25 MG tablet  Commonly known as:  LOPRESSOR  Take 1 tablet (25 mg total) by mouth 2 (two) times daily.     morphine CONCENTRATE 10 MG/0.5ML Soln concentrated solution  Take 0.25 mLs (5 mg total) by mouth every 3 (three) hours as needed for moderate pain, severe pain or shortness of breath.     ondansetron 8 MG tablet  Commonly known as:  ZOFRAN  Take 1 tablet (8 mg total) by mouth every 8 (eight) hours as needed for nausea or vomiting.     oxyCODONE 5 MG immediate release tablet  Commonly known as:  Oxy IR/ROXICODONE  Take 1 tablet (5 mg total) by mouth every 4 (four) hours as needed for severe pain.     pantoprazole 40 MG tablet  Commonly known as:  PROTONIX  Take 1 tablet (40 mg total) by mouth daily.     polyethylene glycol packet  Commonly known as:  MIRALAX / GLYCOLAX  Take 17 g by mouth 2 (two) times daily.     PRESCRIPTION MEDICATION  She receives chemo treatments at the [facility] with Dr. [name]. She received Carboplatin '310mg'$  and Etoposide '130mg'$  on 04/20/14 and Etoposide '130mg'$  on 04/21/14.     salmeterol 50 MCG/DOSE diskus inhaler  Commonly known as:  SEREVENT  Inhale 1 puff into the lungs 2 (two) times daily.     senna 8.6 MG Tabs tablet  Commonly known as:  SENOKOT  Take 1 tablet (8.6 mg total) by mouth 2 (two) times daily.     simvastatin 20 MG tablet  Commonly known as:  ZOCOR  Take 20 mg by mouth daily.         Brief H and P: For complete details please refer to admission H and P, but in brief 64 y.o. female with history of stage 4 small cell lung cancer, chronic resp failure on 3-4L O2 at home, COPD, HTN, and anxietywho presented with acute on chronic resp failure with hypoxia. Pt reported the acute onset of cough over a few days with progressive SOB. Actively receiving chemotherapy for small cell lung cancer. Followed by pulm outpt and [facility], Dr. [name].In the ER T 98.4 heart rate in the 110s to 120s,  respirations 20s, blood pressure in the 120s to 160s. Satting in the mid 90s with nonrebreather. White blood cell count 7.1, hemoglobin 11.4, creatinine 0.36. Chest x-ray noted mild left basilar airspace opacity concerning for pneumonia.   Hospital Course:   Acute on chronic respiratory failure with hypoxia likely due to left lung  pneumonia in the setting of lung cancer, stage IV Improving, O2 sats 94% on 4 L. patient was placed on IV vancomycin and Zosyn, she has completed 10 days of IV antibiotics, was transitioned to oral levofloxacin to complete 4 more days. Unfortunately patient has chronic respiratory failure with stage IV lung cancer, metastatic.  I had discussion with patient's family and palliative medicine, they requested to obtain CT of the chest, abdomen and pelvis, MRI of the brain as was recommended by Dr. [name]. She was to have these imaging on 4/29 however patient was admitted on 4/27. Patient's son requested not to make any decisions regarding palliative care until the imaging results are available if there is any hope for chemotherapy or further treatment. Abdominal ultrasound showed no ascites. MRI of the brain showed favorable response to treatment with improvement in all previously visualized metastasis with no new lesions. Per patient's family, son, [name], wishes to continue chemotherapy, plan skilled nursing facility for rehab.   Patient did have significant fluid overload, was placed on IV diuresis, currently she is tolerating Lasix 60 mg twice a day however recommend strongly to decrease it to 40 mg twice a day on 5/12, obtain labs for BMET. 2-D echo was obtained, EF 60-65% with grade 1 diastolic dysfunction.    Abdominal distension: ? Constipation -CT chest, abdomen and pelvis showed esophagitis, very small bilateral pleural effusions and pericardial effusion, hepatic metastatic deposits, metastatic deposit in the left greater trochanter. Continue stool softeners, Colace,  senna, MiraLAX, there is no ascites on the abdominal ultrasound   Stage IV small cell lung CA with metastasis to brain, liver, bone, underlying history of chronic respiratory failure and COPD - CT chest Improved appearance of the metastatic lung carcinoma with the decreased and lymphadenopathy.  MRI of the brain showed favorable response to treatment with improvement in all previously visualized metastasis and no new lesions. Family wants to continue current treatment and once she is recovered follow-up with oncology for chemotherapy.  Palliative medicine consult was obtained however patient and family wants to continue with chemotherapy. Currently DO NOT RESUSCITATE status. Patient will follow-up with Dr. [name] in next 1 week for the further course of treatment.  Generalized debility - PT OT recommended skilled nursing facility  Day of Discharge BP 126/82 mmHg  Pulse 100  Temp(Src) 97.7 F (36.5 C) (Oral)  Resp 18  Ht '5\' 1"'$  (1.549 m)  Wt 70.1 kg (154 lb 8.7 oz)  BMI 29.22 kg/m2  SpO2 100%  Physical Exam: General: Alert and awake oriented x3 not in any acute distress. HEENT: anicteric sclera, pupils reactive to light and accommodation CVS: S1-S2 clear no murmur rubs or gallops Chest: Decreased breath sounds at the bases, no wheezing Abdomen: soft nontender, nondistended, normal bowel sounds Extremities: no cyanosis, clubbing or edema noted bilaterally Neuro: Cranial nerves II-XII intact, no focal neurological deficits   The results of significant diagnostics from this hospitalization (including imaging, microbiology, ancillary and laboratory) are listed below for reference.    LAB RESULTS: Basic Metabolic Panel:  Recent Labs Lab 07/04/14 0401 07/05/14 0354  NA 135 133*  K 3.7 3.9  CL 83* 84*  CO2 42* 39*  GLUCOSE 180* 203*  BUN 20 23*  CREATININE 0.51 0.48  CALCIUM 9.0 9.0   Liver Function Tests:  Recent Labs Lab 07/04/14 1131  ALBUMIN 3.3*   No results  for input(s): LIPASE, AMYLASE in the last 168 hours. No results for input(s): AMMONIA in the last 168 hours. CBC:  Recent  Labs Lab 07/04/14 0401 07/05/14 0354  WBC 33.8* 36.2*  HGB 10.5* 11.0*  HCT 33.4* 34.4*  MCV 100.3* 99.1  PLT 256 310   Cardiac Enzymes: No results for input(s): CKTOTAL, CKMB, CKMBINDEX, TROPONINI in the last 168 hours. BNP: Invalid input(s): POCBNP CBG:  Recent Labs Lab 07/05/14 2135 07/06/14 0804  GLUCAP 286* 168*    Significant Diagnostic Studies:  Ct Abdomen Pelvis W Contrast  06/24/2014   CLINICAL DATA:  Metastatic lung cancer. New onset of abdominal distention. Increasing shortness of breath.  EXAM: CT ABDOMEN AND PELVIS WITH CONTRAST  TECHNIQUE: Multidetector CT imaging of the abdomen and pelvis was performed using the standard protocol following bolus administration of intravenous contrast.  CONTRAST:  80 cc Omnipaque 300  COMPARISON:  Multiple exams, including 04/30/2014  FINDINGS: Lower chest: Consolidation of much of the left lower lobe and parts of the posterior basal segment right lower lobe concerning for pneumonia or pulmonary hemorrhage. Severe emphysema. Mild distal esophageal wall thickening.  Hepatobiliary: Hypodense metastatic lesions along the falciform ligament and inferiorly in the right hepatic lobe. Index lesion along the falciform ligament measures approximately 2.5 by 1.8 cm. No new liver lesions are identified. The gallbladder is mildly distended. No biliary dilatation observed.  Pancreas: Unremarkable  Spleen: Unremarkable  Adrenals/Urinary Tract: Unremarkable  Stomach/Bowel: There is prominence of stool in the proximal half of the colon but not the distal half. Sigmoid colon diverticulosis. Appendix unremarkable.  Vascular/Lymphatic: Aortoiliac atherosclerotic vascular disease.  Reproductive: Unremarkable  Other: No supplemental non-categorized findings.  Musculoskeletal: Dextroconvex rotary scoliosis. The patient has known osseous  metastatic disease for example in the left femur greater trochanter. Bony demineralization is present.  IMPRESSION: 1. Consolidation and much of the left lower lobe and in the posterior basal segment right lower lobe, favoring pneumonia or pulmonary hemorrhage. 2. Hepatic masses compatible with hepatic metastatic disease is, reduced in size over the past 2 months. 3. The patient has known osseous metastatic disease to the left proximal femur. 4. Mild prominence of stool in the proximal half of the colon without obstruction identified. This could reflect low grade constipation. 5. Severe emphysema. 6. Mild distal esophageal wall thickening -esophagitis not excluded. 7.  Aortoiliac atherosclerotic vascular disease.   Electronically Signed   By: Van Clines M.D.   On: 06/24/2014 07:37   Dg Chest Port 1 View  06/24/2014   CLINICAL DATA:  Acute onset of shortness of breath. Initial encounter.  EXAM: PORTABLE CHEST - 1 VIEW  COMPARISON:  PET/CT performed 04/30/2014, and CT of the chest performed 04/03/2014  FINDINGS: The lungs are well-aerated. Milder left basilar airspace opacity raises concern for pneumonia. There is no evidence of pleural effusion or pneumothorax.  Known metastatic disease is not well characterized on radiograph. The cardiomediastinal silhouette is within normal limits. No acute osseous abnormalities are seen. A right-sided chest port is noted ending about the distal SVC.  IMPRESSION: Mild left basilar airspace opacity raises concern for pneumonia.   Electronically Signed   By: Garald Balding M.D.   On: 06/24/2014 00:57    2D ECHO: Study Conclusions  - Left ventricle: The cavity size was normal. Systolic function was normal. The estimated ejection fraction was in the range of 60% to 65%. Wall motion was normal; there were no regional wall motion abnormalities. Doppler parameters are consistent with abnormal left ventricular relaxation (grade 1 diastolic dysfunction). -  Right ventricle: The cavity size was normal. Wall thickness was mildly increased.  Disposition and Follow-up: Discharge Instructions  Diet Carb Modified    Complete by:  As directed      Increase activity slowly    Complete by:  As directed             DISPOSITION: Skilled nursing facility   DISCHARGE FOLLOW-UP Follow-up Information    Follow up with [name], DO. Schedule an appointment as soon as possible for a visit in 10 days.   Specialty:  Family Medicine   Why:  for hospital follow-up   Contact information:   [address] [place] [address] [phone]       Follow up with Chesley Mires, MD On 07/08/2014.   Specialty:  Pulmonary Disease   Why:  at 2:00pm   Contact information:   [address] [place] [address] [phone]       Follow up with Eilleen Kempf., MD. Schedule an appointment as soon as possible for a visit in 1 week.   Specialty:  Oncology   Why:  for hospital follow-up   Contact information:   [place] [address] [phone]        Time spent on Discharge: 40 mins   Signed:   Amiree No M.D. [facility] 07/06/2014, 11:08 AM Pager: [phone]

## 2014-07-06 NOTE — Progress Notes (Signed)
Report called to [facility]. No answer from staff member.Patient in route to facility transported by [facility]. All patient belongings sent with patient.

## 2014-07-06 NOTE — Progress Notes (Signed)
Physical Therapy Treatment Patient Details Name: Alexa Stephens MRN: [id number] DOB: 1950/09/14 Today's Date: 07/06/2014    History of Present Illness 64 y.o. female with history of stage 4 small cell lung cancer, chronic resp failure on 3-4L O2 at home, COPD, HTN, and anxietywho presented with acute on chronic respiratory failure with hypoxia likely due to left lung pneumonia in the setting of lung cancer, stage IV.     PT Comments    Patient seen for therapy session. Patient received saturated in urine, pericare and hygiene performed in conjunction with bed mobility. Patient assisted to EOB for sitting, tolerated 2 minutes before becoming very anxious with elevated HR 140s and O2 saturations dropping to 90% on 4 liters, increased to 5 liters at bedside with nsg present. Remained low 90s (92% with HR 120s). Educated son on positioning and cues for nasal inhalation. Son Patent attorney and receptive.   Follow Up Recommendations  SNF;Supervision/Assistance - 24 hour     Equipment Recommendations   (TBD at next venue of care)    Recommendations for Other Services       Precautions / Restrictions Precautions Precautions: Fall Precaution Comments: monitor O2 Restrictions Weight Bearing Restrictions: No    Mobility  Bed Mobility Overal bed mobility: Needs Assistance Bed Mobility: Rolling;Sidelying to Sit Rolling: Min assist Sidelying to sit: Mod assist     Sit to sidelying: Mod assist General bed mobility comments: Patient with difficulty rolling, assist provided, assist to elevate trunk to EOB, patient very anxious (performed extensive bed mobility for pericare and hygiene)  Transfers                    Ambulation/Gait                 Stairs            Wheelchair Mobility    Modified Rankin (Stroke Patients Only)       Balance Overall balance assessment: Needs assistance Sitting-balance support: No upper extremity supported Sitting balance-Leahy  Scale: Poor                              Cognition Arousal/Alertness: Awake/alert Behavior During Therapy: Anxious Overall Cognitive Status: Within Functional Limits for tasks assessed                      Exercises Other Exercises Other Exercises: patient tolerated EOB activity approx 2 minutes before reporting that she could no longer breathe and needed to lay down. Saturations dropped from 97% to 90% on 4 liters with sitting, HR elevated to 140s. patient very anxious Other Exercises: bed mobility x4 for pericare and hygiene    General Comments        Pertinent Vitals/Pain Pain Assessment: Faces Pain Score: 4  Faces Pain Scale: Hurts little more Pain Location: no location given Pain Descriptors / Indicators: Grimacing    Home Living                      Prior Function            PT Goals (current goals can now be found in the care plan section) Acute Rehab PT Goals PT Goal Formulation: Patient unable to participate in goal setting Time For Goal Achievement: 07/12/14 Potential to Achieve Goals: Fair Progress towards PT goals: Not progressing toward goals - comment    Frequency  Min 2X/week  PT Plan Current plan remains appropriate    Co-evaluation             End of Session Equipment Utilized During Treatment: Oxygen (increased to 5LO2 via [place]) Activity Tolerance: Patient limited by fatigue (limited by anxiety) Patient left: in bed;with call bell/phone within reach;with nursing/sitter in room;with family/visitor present     Time: 1980-2217 PT Time Calculation (min) (ACUTE ONLY): 20 min  Charges:  $Therapeutic Activity: 8-22 mins                    G CodesDuncan Dull Jul 28, 2014, 5:02 PM Alben Deeds, Tyrrell DPT  [phone]

## 2014-07-07 ENCOUNTER — Ambulatory Visit: Payer: Medicare Other

## 2014-07-08 ENCOUNTER — Encounter: Payer: Self-pay | Admitting: Pulmonary Disease

## 2014-07-08 ENCOUNTER — Ambulatory Visit (INDEPENDENT_AMBULATORY_CARE_PROVIDER_SITE_OTHER): Payer: Medicare Other | Admitting: Pulmonary Disease

## 2014-07-08 ENCOUNTER — Ambulatory Visit: Payer: Medicare Other | Admitting: Family Medicine

## 2014-07-08 ENCOUNTER — Ambulatory Visit: Payer: Medicare Other

## 2014-07-08 VITALS — BP 102/80 | HR 107

## 2014-07-08 DIAGNOSIS — J449 Chronic obstructive pulmonary disease, unspecified: Secondary | ICD-10-CM | POA: Diagnosis not present

## 2014-07-08 DIAGNOSIS — B370 Candidal stomatitis: Secondary | ICD-10-CM

## 2014-07-08 DIAGNOSIS — J9611 Chronic respiratory failure with hypoxia: Secondary | ICD-10-CM

## 2014-07-08 DIAGNOSIS — C3490 Malignant neoplasm of unspecified part of unspecified bronchus or lung: Secondary | ICD-10-CM | POA: Diagnosis not present

## 2014-07-08 DIAGNOSIS — J432 Centrilobular emphysema: Secondary | ICD-10-CM

## 2014-07-08 MED ORDER — FLUTICASONE PROPIONATE 50 MCG/ACT NA SUSP: 2.0000 | Freq: Every day | NASAL | Status: AC

## 2014-07-08 MED ORDER — FLUCONAZOLE 100 MG PO TABS: 100.0000 mg | ORAL_TABLET | Freq: Every day | ORAL | Status: AC

## 2014-07-08 NOTE — Progress Notes (Signed)
Chief Complaint  Patient presents with  . Follow-up    Pt d/c from [facility] 07/06/14. Pt states that breathing is worse since being out of hospital. Pt discharged to [facility] 07/06/14    History of Present Illness: Alexa Stephens is a 64 y.o. female smoker with severe COPD, hypoxia, and Extensive stage SCLC.  She is DNR.  She was in hospital for pneumonia.  She remains on Abx.  Her chemotherapy is on hold.    She still feels weak, and gets winded.  She has been in [facility] since getting out of hospital.  She is using 4 liters oxygen.  She likes using serevent.  She uses albuterol/ipratropium occasionally >> feels she could use it more.  She has trouble with her memory.  She still has cough with sputum.    CT chest/abd from 07/01/14 and MRI 07/02/14 showed decreased size of malignant lesions.  Tests: Spirometry 11/01/11>>FEV1 0.48 (21%), FEV1% 37 (difficult pt effort). 11/01/11 SpO2 85% on room air at rest A1AT 06/21/12 >> 140, PI-MS Echo 07/05/14 >> EF 60 to 40%, grade 1 diastolic dysfx  PMHx >> Anxiety, HLD, HTN, Headaches  PSHx, Medications, Allergies, Fhx, Shx reviewed.  Physical Exam: Blood pressure 118/80, pulse 90, height '5\' 2"'$  (1.575 m), weight 95 lb 9.6 oz (43.364 kg), SpO2 99 %. There is no weight on file to calculate BMI.  General - wearing oxygen, cachectic, in wheelchair ENT - No sinus tenderness, changes of thrush, no LAN Cardiac - s1s2 regular, no murmur Chest - prolonged exhalation, decreased breath sounds Back - no focal tenderness Abd - soft, non-tender Ext - no edema, deformity right hand Neuro - normal strength Skin - no rashes Psych - normal mood, and behavior    Assessment/Plan:  GOLD 4 COPD with emphysema. Plan: - continue serevent - prn albuterol/ipratropium  Chronic hypoxic respiratory failure. Plan: - continue 2 to 4 liters oxygen >> goal is to keep SpO2 around 92%  Extended stage SCLC. Plan: - f/u with oncology - she is to  d/w oncology about plans for decadron  Thrush. Plan: - diflucan  Goals of Care. Plan: - DNR   Chesley Mires, MD [facility] Pager:  [phone] 07/08/2014, 2:25 PM

## 2014-07-08 NOTE — Patient Instructions (Signed)
Flonase two sprays each nostril daily as needed Diflucan 100 mg daily for 7 days Saline nasal spray daily as needed  Follow up in 3 months

## 2014-07-09 ENCOUNTER — Ambulatory Visit: Payer: Medicare Other

## 2014-07-17 ENCOUNTER — Encounter: Payer: Self-pay | Admitting: *Deleted

## 2014-07-17 NOTE — [facility] Oncology Navigator Note (Unsigned)
Oncology Nurse Navigator Documentation  Oncology Nurse Navigator Flowsheets 07/17/2014  Navigator Encounter Type 3 month  Patient Visit Type Follow-up.  Looked up patient information due to follow up.  Looks like patient is out of the hospital at this time and at a SNF.  According to palliative care consult last note, it looks like patient would like to continue treatment.  I contacted Dr. [name] to see when to schedule.    Interventions Coordination of Care  Time Spent with Patient 15

## 2014-07-24 ENCOUNTER — Other Ambulatory Visit: Payer: Self-pay | Admitting: *Deleted

## 2014-07-24 ENCOUNTER — Telehealth: Payer: Self-pay | Admitting: Internal Medicine

## 2014-07-24 DIAGNOSIS — C3490 Malignant neoplasm of unspecified part of unspecified bronchus or lung: Secondary | ICD-10-CM

## 2014-07-24 NOTE — Telephone Encounter (Signed)
s.w. pt son and advised on June appt...ok and aware

## 2014-07-24 NOTE — Progress Notes (Signed)
Oncology Nurse Navigator Documentation  Oncology Nurse Navigator Flowsheets 07/24/2014  Navigator Encounter Type Other  Patient Visit Type Follow-up  Interventions Coordination of Care.  Placed POF for patient to be scheduled and seen for follow up next 1-2 weeks per Dr. [name] with him or any APP  Time Spent with Patient 15

## 2014-07-29 ENCOUNTER — Other Ambulatory Visit: Payer: Self-pay | Admitting: Medical Oncology

## 2014-07-29 DIAGNOSIS — C787 Secondary malignant neoplasm of liver and intrahepatic bile duct: Secondary | ICD-10-CM

## 2014-07-29 DIAGNOSIS — C3490 Malignant neoplasm of unspecified part of unspecified bronchus or lung: Secondary | ICD-10-CM

## 2014-07-29 MED ORDER — PANTOPRAZOLE SODIUM 40 MG PO TBEC: 40.0000 mg | DELAYED_RELEASE_TABLET | Freq: Every day | ORAL | Status: AC

## 2014-08-03 ENCOUNTER — Encounter: Payer: Self-pay | Admitting: Oncology

## 2014-08-03 ENCOUNTER — Other Ambulatory Visit (HOSPITAL_BASED_OUTPATIENT_CLINIC_OR_DEPARTMENT_OTHER): Payer: Medicare Other

## 2014-08-03 ENCOUNTER — Telehealth: Payer: Self-pay | Admitting: Oncology

## 2014-08-03 ENCOUNTER — Telehealth: Payer: Self-pay | Admitting: *Deleted

## 2014-08-03 ENCOUNTER — Ambulatory Visit (HOSPITAL_COMMUNITY)
Admission: RE | Admit: 2014-08-03 | Discharge: 2014-08-03 | Disposition: A | Discharge status: Home / Self Care | Payer: Medicare Other | Source: Ambulatory Visit | Attending: Oncology | Admitting: Oncology

## 2014-08-03 ENCOUNTER — Ambulatory Visit (HOSPITAL_BASED_OUTPATIENT_CLINIC_OR_DEPARTMENT_OTHER): Payer: Medicare Other | Admitting: Oncology

## 2014-08-03 VITALS — BP 112/67 | HR 90 | Temp 97.9°F | Resp 16 | Ht 61.0 in

## 2014-08-03 DIAGNOSIS — D6481 Anemia due to antineoplastic chemotherapy: Secondary | ICD-10-CM

## 2014-08-03 DIAGNOSIS — C3490 Malignant neoplasm of unspecified part of unspecified bronchus or lung: Secondary | ICD-10-CM

## 2014-08-03 DIAGNOSIS — R109 Unspecified abdominal pain: Secondary | ICD-10-CM | POA: Diagnosis not present

## 2014-08-03 DIAGNOSIS — K3189 Other diseases of stomach and duodenum: Secondary | ICD-10-CM | POA: Insufficient documentation

## 2014-08-03 DIAGNOSIS — K5900 Constipation, unspecified: Secondary | ICD-10-CM | POA: Insufficient documentation

## 2014-08-03 LAB — COMPREHENSIVE METABOLIC PANEL (CC13)
ALBUMIN: 2.8 g/dL — AB (ref 3.5–5.0)
ALT: 72 U/L — AB (ref 0–55)
ANION GAP: 16 meq/L — AB (ref 3–11)
AST: 37 U/L — AB (ref 5–34)
Alkaline Phosphatase: 164 U/L — ABNORMAL HIGH (ref 40–150)
BUN: 22.5 mg/dL (ref 7.0–26.0)
CHLORIDE: 87 meq/L — AB (ref 98–109)
CO2: 37 mEq/L — ABNORMAL HIGH (ref 22–29)
CREATININE: 0.6 mg/dL (ref 0.6–1.1)
Calcium: 9.1 mg/dL (ref 8.4–10.4)
EGFR: >90 mL/min/{1.73_m2} (ref >90)
GLUCOSE: 231 mg/dL — AB (ref 70–140)
Potassium: 3.9 mEq/L (ref 3.5–5.1)
Sodium: 140 mEq/L (ref 136–145)
TOTAL PROTEIN: 6.4 g/dL (ref 6.4–8.3)
Total Bilirubin: 0.24 mg/dL (ref 0.20–1.20)

## 2014-08-03 LAB — CBC WITH DIFFERENTIAL/PLATELET
BASO%: 0.6 % (ref 0.0–2.0)
Basophils Absolute: 0.1 10e3/uL (ref 0.0–0.1)
EOS%: 0.4 % (ref 0.0–7.0)
Eosinophils Absolute: 0.1 10e3/uL (ref 0.0–0.5)
HCT: 36.2 % (ref 34.8–46.6)
HGB: 11.8 g/dL (ref 11.6–15.9)
LYMPH%: 10.6 % — ABNORMAL LOW (ref 14.0–49.7)
MCH: 32.4 pg (ref 25.1–34.0)
MCHC: 32.6 g/dL (ref 31.5–36.0)
MCV: 99.4 fL (ref 79.5–101.0)
MONO#: 0.6 10e3/uL (ref 0.1–0.9)
MONO%: 4.5 % (ref 0.0–14.0)
NEUT#: 11.4 10e3/uL — ABNORMAL HIGH (ref 1.5–6.5)
NEUT%: 83.9 % — ABNORMAL HIGH (ref 38.4–76.8)
Platelets: 360 10e3/uL (ref 145–400)
RBC: 3.64 10e6/uL — ABNORMAL LOW (ref 3.70–5.45)
RDW: 17.4 % — ABNORMAL HIGH (ref 11.2–14.5)
WBC: 13.6 10e3/uL — ABNORMAL HIGH (ref 3.9–10.3)
lymph#: 1.4 10e3/uL (ref 0.9–3.3)

## 2014-08-03 LAB — TECHNOLOGIST REVIEW

## 2014-08-03 MED ORDER — MORPHINE SULFATE 15 MG PO TABS
ORAL_TABLET | ORAL | Status: AC
  Filled 2014-08-03: qty 1

## 2014-08-03 MED ORDER — LACTULOSE 10 GM/15ML PO SOLN: 20.0000 g | ORAL | Status: AC | PRN

## 2014-08-03 MED ORDER — MORPHINE SULFATE 15 MG PO TABS
15.0000 mg | ORAL_TABLET | Freq: Once | ORAL | Status: AC
  Administered 2014-08-03: 15 mg via ORAL

## 2014-08-03 NOTE — Telephone Encounter (Signed)
Informed son Abd xray shows constipation per Mikey Bussing and she has given orders to [facility] to give pt Lactulose for constipation.   Son states the facility cannot get the medication this evening and he asks if rx can be sent to [facility] on [address].   Rx sent.

## 2014-08-03 NOTE — Telephone Encounter (Signed)
per pof tos ch pt appt-cld & spoke w/john POA-and gave pt next appt time & date-John understood

## 2014-08-03 NOTE — Progress Notes (Signed)
Alexa Stephens [phone]   [phone]  OFFICE PROGRESS NOTE  [name], DO [place] [address]  DIAGNOSIS: Extensive stage (T1a, N2, M1b) small cell lung cancer diagnosed in February 2016.  PRIOR THERAPY: None  CURRENT THERAPY: Systemic chemotherapy with carboplatin for AUC of 5 on day 1 and etoposide 100 MG/M2 on days 1, 2 and 3 with Neulasta support on day 4. Status post 3 cycles. Her chemotherapy has been placed on hold since April due to hospital admission for pneumonia.  INTERVAL HISTORY: Alexa Stephens 64 y.o. female returns to the clinic today for follow-up visit accompanied by her son. The patient was hospitalized in April for pneumonia. She is now at a skilled nursing facility for short-term rehabilitation, but her son states that she is not making progress and will likely not be able to continue to stay there for rehabilitation much longer. The patient is really unable to answer my questions today. She keeps stating "help me" and that she needs to have a bowel movement. She indicates that she has pain in her abdomen and is requesting pain medication here in our office today. I really unable to get a full review of systems from her due to her lack of cooperation with answering questions. Her son states that this is very different for her. She remains on oxygen at 4 L.  MEDICAL HISTORY: Past Medical History  Diagnosis Date  . Anxiety disorder   . Asthma   . COPD (chronic obstructive pulmonary disease)     Not on home O2 yet but is prescribed.  . Headache(784.0)   . Hyperlipemia   . Hypertension   . Pneumonia Sept 2013  . Sepsis Sept 2013  . Cancer     lung cancer     ALLERGIES:  is allergic to benadryl; compazine; excedrin extra strength; flexeril; lexapro; naproxen; skelaxin; and tylenol.  MEDICATIONS:  Current Outpatient Prescriptions  Medication Sig Dispense Refill  . albuterol (PROAIR HFA) 108 (90 BASE)  MCG/ACT inhaler Inhale 2 puffs into the lungs every 6 (six) hours as needed for wheezing or shortness of breath. 3 Inhaler 1  . albuterol (PROVENTIL) (2.5 MG/3ML) 0.083% nebulizer solution Inhale 3 mLs into the lungs every 6 (six) hours as needed for wheezing or shortness of breath.   12  . dexamethasone (DECADRON) 4 MG tablet Take 1 tablet (4 mg total) by mouth 2 (two) times daily. 60 tablet 0  . docusate sodium (COLACE) 100 MG capsule Take 2 capsules (200 mg total) by mouth 2 (two) times daily. 10 capsule 0  . furosemide (LASIX) 20 MG tablet Take 3 tablets (60 mg total) by mouth 2 (two) times daily. 30 tablet 0  . insulin aspart (NOVOLOG) 100 UNIT/ML injection Inject 0-9 Units into the skin 3 (three) times daily with meals. Sliding scale CBG 70 - 120: 0 units CBG 121 - 150: 1 unit,  CBG 151 - 200: 2 units,  CBG 201 - 250: 3 units,  CBG 251 - 300: 5 units,  CBG 301 - 350: 7 units,  CBG 351 - 400: 9 units   CBG > 400: 9 units and notify your MD 10 mL 11  . LORazepam (ATIVAN) 0.5 MG tablet Take 0.5 mg by mouth every 8 (eight) hours as needed for anxiety.    . metoprolol tartrate (LOPRESSOR) 25 MG tablet Take 1 tablet (25 mg total) by mouth 2 (two) times daily.    Marland Kitchen  pantoprazole (PROTONIX) 40 MG tablet Take 1 tablet (40 mg total) by mouth daily. 30 tablet 1  . polyethylene glycol (MIRALAX / GLYCOLAX) packet Take 17 g by mouth 2 (two) times daily. 14 each 0  . senna (SENOKOT) 8.6 MG TABS tablet Take 1 tablet (8.6 mg total) by mouth 2 (two) times daily. 120 each 0  . simvastatin (ZOCOR) 20 MG tablet Take 20 mg by mouth daily.    . bisacodyl (DULCOLAX) 10 MG suppository Place 1 suppository (10 mg total) rectally daily as needed for mild constipation. 12 suppository 0  . clonazePAM (KLONOPIN) 1 MG tablet Take 1 tablet (1 mg total) by mouth 2 (two) times daily as needed. for anxiety 30 tablet 0  . fluconazole (DIFLUCAN) 100 MG tablet Take 1 tablet (100 mg total) by mouth daily. 7 tablet 0  . fluticasone  (FLONASE) 50 MCG/ACT nasal spray Place 2 sprays into both nostrils daily. 16 g 2  . ipratropium (ATROVENT) 0.02 % nebulizer solution Take 2.5 mLs (500 mcg total) by nebulization every 6 (six) hours as needed (ICD10: J44.9). 75 mL 5  . Ipratropium-Albuterol (COMBIVENT RESPIMAT) 20-100 MCG/ACT AERS respimat INHALE 1 PUFF BY MOUTH EVERY 6 HOURS. 4 g 0  . lactulose (CHRONULAC) 10 GM/15ML solution Take 30 mLs (20 g total) by mouth every 4 (four) hours as needed for moderate constipation. 240 mL 0  . levofloxacin (LEVAQUIN) 750 MG tablet Take 1 tablet (750 mg total) by mouth daily. X 3days    . lidocaine-prilocaine (EMLA) cream Apply 1 application topically as needed. 30 g 2  . Morphine Sulfate (MORPHINE CONCENTRATE) 10 MG/0.5ML SOLN concentrated solution Take 0.25 mLs (5 mg total) by mouth every 3 (three) hours as needed for moderate pain, severe pain or shortness of breath. 30 mL 0  . ondansetron (ZOFRAN) 8 MG tablet Take 1 tablet (8 mg total) by mouth every 8 (eight) hours as needed for nausea or vomiting. 20 tablet 0  . oxyCODONE (OXY IR/ROXICODONE) 5 MG immediate release tablet Take 1 tablet (5 mg total) by mouth every 4 (four) hours as needed for severe pain. 20 tablet 0  . PRESCRIPTION MEDICATION She receives chemo treatments at the [facility] with Dr. [name]. She received Carboplatin '310mg'$  and Etoposide '130mg'$  on 04/20/14 and Etoposide '130mg'$  on 04/21/14.    . salmeterol (SEREVENT) 50 MCG/DOSE diskus inhaler Inhale 1 puff into the lungs 2 (two) times daily. 1 Inhaler 12   No current facility-administered medications for this visit.    SURGICAL HISTORY:  Past Surgical History  Procedure Laterality Date  . Cosmetic surgery    . Cystectomy    . Hand surgery      REVIEW OF SYSTEMS:  Constitutional: positive for anorexia and fatigue Eyes: negative Ears, nose, mouth, throat, and face: negative Respiratory: negative Cardiovascular: negative Gastrointestinal: positive for abdominal  pain and constipation Genitourinary:negative Integument/breast: negative Hematologic/lymphatic: negative Musculoskeletal:positive for muscle weakness Neurological: positive for memory problems Behavioral/Psych: negative Endocrine: negative Allergic/Immunologic: negative   PHYSICAL EXAMINATION: General appearance: alert, cooperative, fatigued and no distress Head: Normocephalic, without obvious abnormality, atraumatic Neck: no adenopathy, no JVD, supple, symmetrical, trachea midline and thyroid not enlarged, symmetric, no tenderness/mass/nodules Lymph nodes: Cervical, supraclavicular, and axillary nodes normal. Resp: clear to auscultation bilaterally Back: symmetric, no curvature. ROM normal. No CVA tenderness. Cardio: regular rate and rhythm, S1, S2 normal, no murmur, click, rub or gallop GI: abnormal findings:  distended and hypoactive bowel sounds Extremities: extremities normal, atraumatic, no cyanosis or edema Neurologic:  Alert and oriented X 3, normal strength and tone. Normal symmetric reflexes. Normal coordination and gait  ECOG PERFORMANCE STATUS: 2 - Symptomatic, <50% confined to bed  Blood pressure 112/67, pulse 90, temperature 97.9 F (36.6 C), temperature source Oral, resp. rate 16, height '5\' 1"'$  (1.549 m), SpO2 100 %.  LABORATORY DATA: Lab Results  Component Value Date   WBC 13.6* 08/03/2014   HGB 11.8 08/03/2014   HCT 36.2 08/03/2014   MCV 99.4 08/03/2014   PLT 360 08/03/2014      Chemistry      Component Value Date/Time   NA 140 08/03/2014 1426   NA 133* 07/05/2014 0354   K 3.9 08/03/2014 1426   K 3.9 07/05/2014 0354   CL 84* 07/05/2014 0354   CO2 37* 08/03/2014 1426   CO2 39* 07/05/2014 0354   BUN 22.5 08/03/2014 1426   BUN 23* 07/05/2014 0354   CREATININE 0.6 08/03/2014 1426   CREATININE 0.48 07/05/2014 0354   CREATININE 0.45* 04/02/2014 1642      Component Value Date/Time   CALCIUM 9.1 08/03/2014 1426   CALCIUM 9.0 07/05/2014 0354   ALKPHOS 164*  08/03/2014 1426   ALKPHOS 113 06/27/2014 0241   AST 37* 08/03/2014 1426   AST 25 06/27/2014 0241   ALT 72* 08/03/2014 1426   ALT 39* 06/27/2014 0241   BILITOT 0.24 08/03/2014 1426   BILITOT 0.6 06/27/2014 0241       RADIOGRAPHIC STUDIES: Dg Abd 2 Views  08/03/2014   CLINICAL DATA:  Abdominal pain, constipation and history of small cell lung carcinoma.  EXAM: ABDOMEN - 2 VIEW  COMPARISON:  CT of the abdomen and pelvis on 07/01/2014  FINDINGS: Moderate and diffuse amount of fecal material seen throughout the colon without evidence of focal impaction. No evidence of bowel obstruction. The stomach is moderately distended. No free air identified. No abnormal calcifications. Bony structures show osteopenia without focal lesions identified.  IMPRESSION: Moderate fecal material throughout the colon. Gastric distention. No evidence of bowel obstruction or perforation.   Electronically Signed   By: Aletta Edouard M.D.   On: 08/03/2014 16:03    ASSESSMENT AND PLAN: This is a very pleasant 64 year old white female with extensive stage small cell lung cancer currently undergoing systemic chemotherapy with carboplatin and etoposide status post 2 cycles. She tolerated the first 3 cycles fairly well except for the fatigue secondary to chemotherapy-induced anemia. She also has persistent shortness of breath secondary to COPD in addition to her disease.  Patient was hospitalized for pneumonia following her third next cycle of chemotherapy and her chemotherapy is been on hold since. She is now residing in a skilled nursing facility for short-term rehabilitation. She is having abdominal discomfort today.  I have reviewed with the sun previous CT and MRI which showed stable disease. We discussed resuming her chemotherapy, but the son thinks that they will hold off this time as they are considering hospice. The patient is having a lot of pain today and we have given her 15 mg of oral morphine here in the office for  pain control. She has Roxanol ordered at the nursing home as needed.  Given her abdominal complaints and exam findings, I will get an abdominal x-ray to rule out an obstruction. I suspect that she is constipated and I have tentatively ordered lactulose 30 mL every 4 hours as needed until her bowels moved. She will continue her other laxatives as already ordered.  The son would like to schedule follow-up  in several weeks time for when his mother is feeling better to discuss possible chemotherapy versus hospice at that time. I have tentatively scheduled her for follow-up in approximately 3-4 weeks.  She was advised to call immediately if she has any concerning symptoms in the interval. The patient voices understanding of current disease status and treatment options and is in agreement with the current care plan.  All questions were answered. The patient knows to call the clinic with any problems, questions or concerns. We can certainly see the patient much sooner if necessary.  Mikey Bussing, DNP, AGPCNP-BC, AOCNP  Addendum: Abdominal x-ray demonstrates moderate fecal material throughout the colon without evidence of bowel obstruction or perforation. The patient's son was called this information and was instructed to proceed with the lactulose as previously ordered.

## 2014-08-07 ENCOUNTER — Telehealth: Payer: Self-pay | Admitting: *Deleted

## 2014-08-07 NOTE — Telephone Encounter (Signed)
Call from Standard at [facility], [place] will be d/c from [facility] home 6/13 to Hospice. Confirmed with Jocelyn Lamer MD will be attending.

## 2014-08-18 ENCOUNTER — Telehealth: Payer: Self-pay | Admitting: *Deleted

## 2014-08-18 NOTE — Telephone Encounter (Signed)
Call from [place] Director at [email or web address] stating pt arrived over weekend for respite care, however has begun to decline and will need symptom management. Message given to MD for review.

## 2014-08-24 ENCOUNTER — Telehealth: Payer: Self-pay | Admitting: Medical Oncology

## 2014-08-24 NOTE — Telephone Encounter (Signed)
Pt died 26-Aug-2014

## 2014-08-25 ENCOUNTER — Telehealth: Payer: Self-pay | Admitting: Internal Medicine

## 2014-08-25 NOTE — Telephone Encounter (Signed)
Received death certificate 2/82/06

## 2014-08-28 DEATH — deceased

## 2014-09-01 ENCOUNTER — Ambulatory Visit: Payer: Medicare Other | Admitting: Internal Medicine

## 2014-09-01 ENCOUNTER — Other Ambulatory Visit: Payer: Medicare Other

## 2014-09-29 ENCOUNTER — Telehealth: Payer: Self-pay | Admitting: Medical Oncology

## 2014-09-29 NOTE — Telephone Encounter (Signed)
Cc to [place]

## 2015-05-14 ENCOUNTER — Other Ambulatory Visit: Payer: Self-pay | Admitting: Nurse Practitioner

## 2016-05-02 IMAGING — CR DG CHEST 2V
2 series · 2 of 2 positions shown · non-contrast
Comparison: June 11, 2013

CLINICAL DATA: One month history of worsening shortness of breath
and cough; hypertension

EXAM:
CHEST  2 VIEW

[view not recorded (1 of 2)]
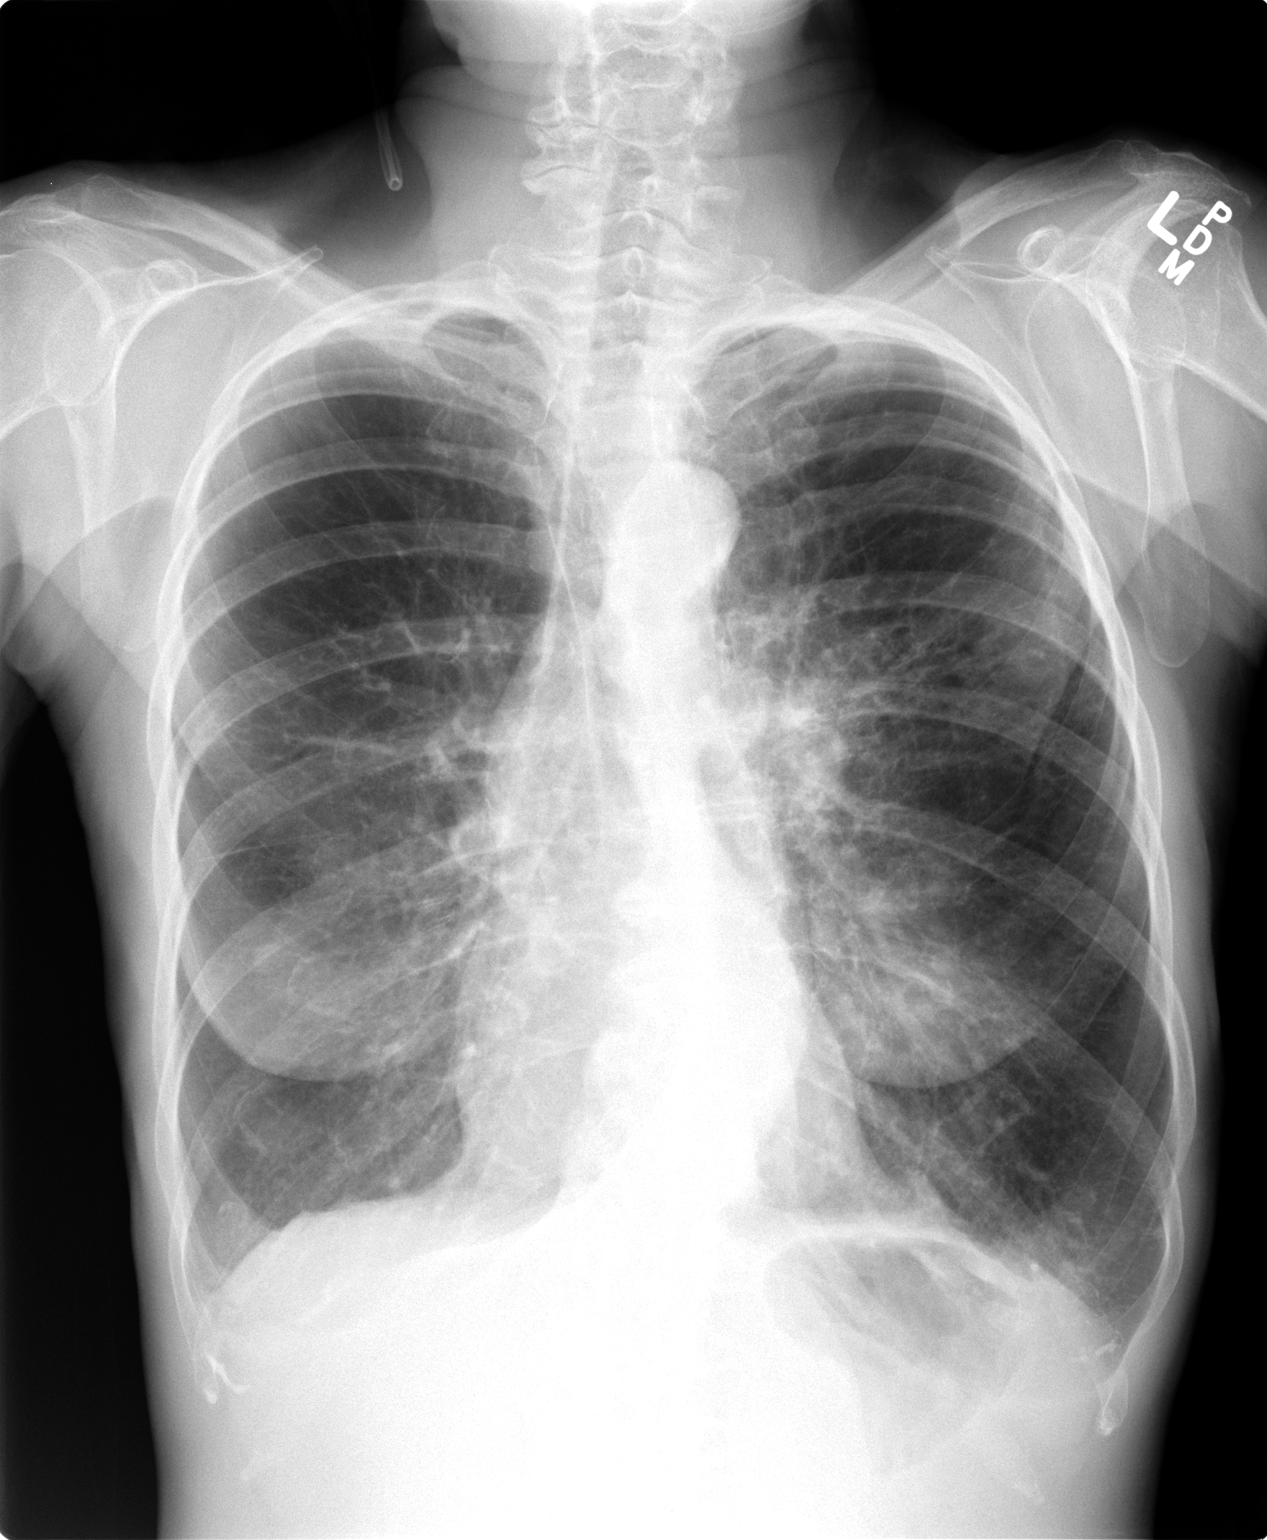

[view not recorded (2 of 2)]
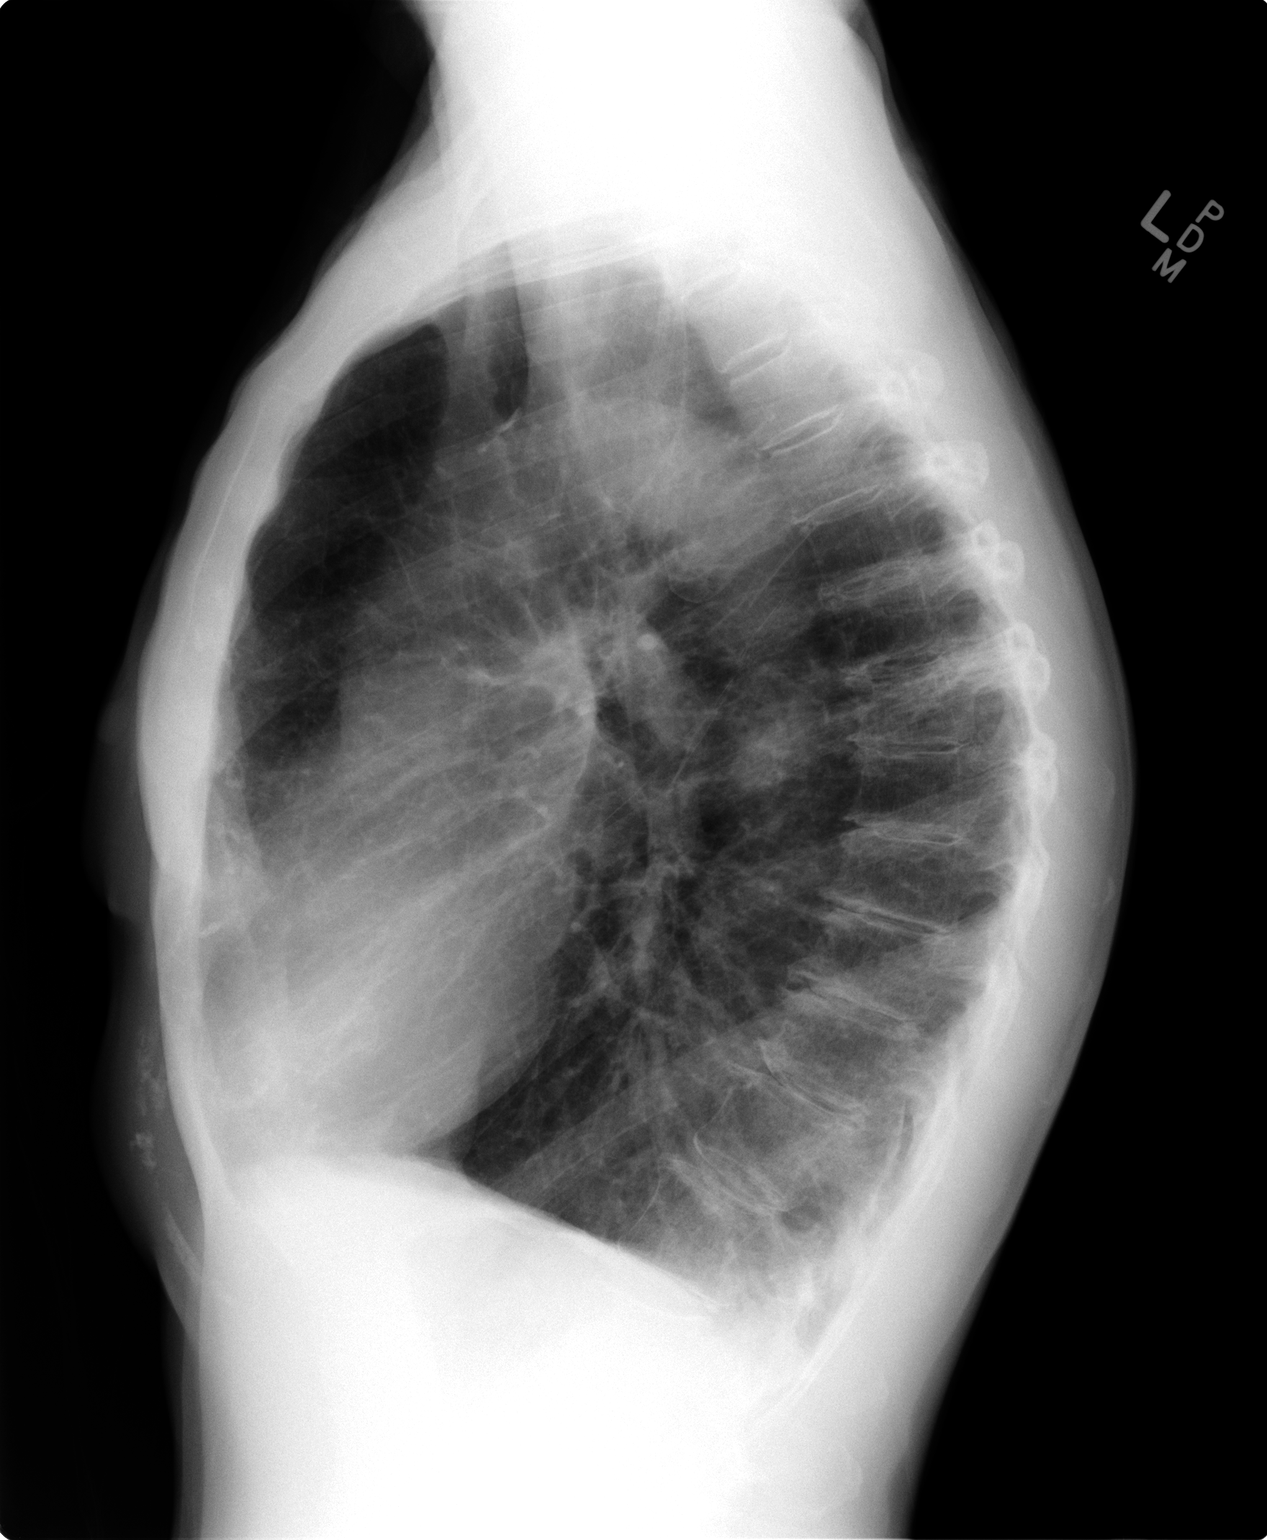

[2 of 2 positions shown; findings below may reference images not displayed]

FINDINGS: There is underlying emphysematous change. There is mild perihilar
interstitial edema. There is no airspace consolidation. Nipple
shadows are noted bilaterally. The heart is normal in size. The
appearance of the pulmonary vascularity suggests that there is
pulmonary vascular congestion superimposed on emphysematous change.
No adenopathy. There is degenerative change in the thoracic spine.
There is mid and lower thoracic levoscoliosis.
IMPRESSION: Findings felt to represent a degree of congestive heart failure
superimposed on emphysematous change. No consolidation.

## 2016-08-09 IMAGING — CT CT ABD-PELV W/ CM
3 of 8 series · 13 of 36 positions shown, 20 images · IV contrast (APPLIED)
Comparison: none

[Series 2: chest 5.0 b31f · axial · 0.59mm/px · z∈[+984,+1064]mm · 3 of 64 slices shown]
[im 8/64  mediastinal]
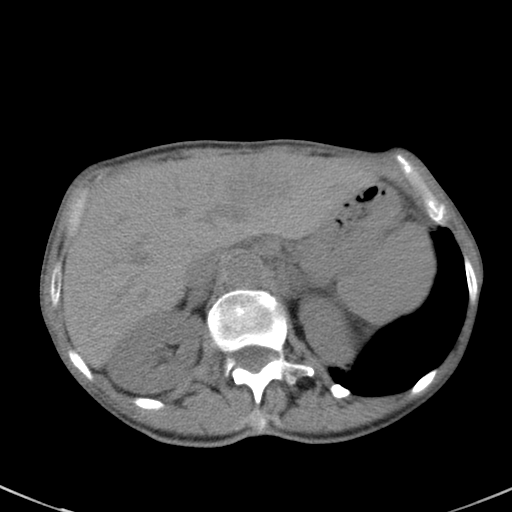
[im 16/64  mediastinal]
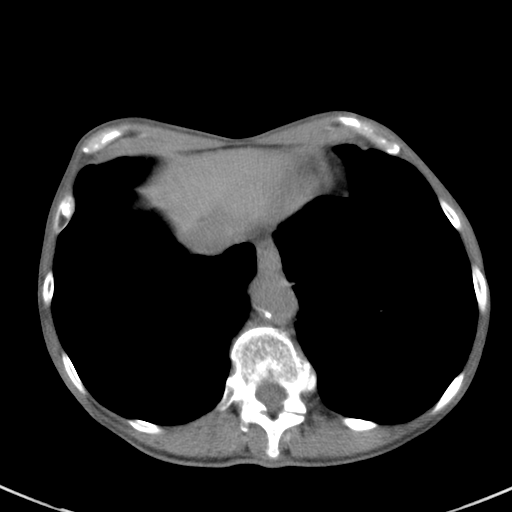
[im 24/64  mediastinal]
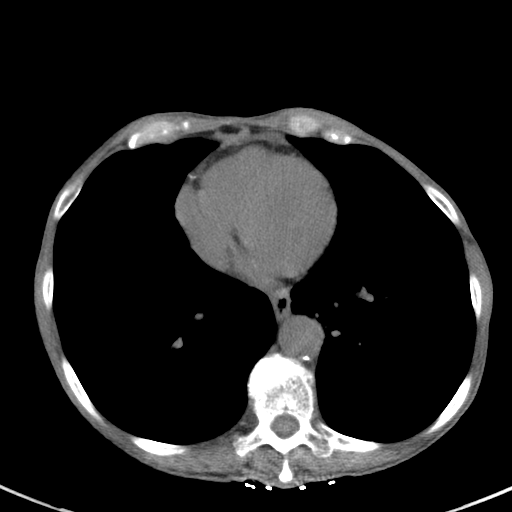

[Series 7: abd/pelvis 5.0 b31f · axial · 0.58mm/px · z∈[+722,+1012]mm · 9 of 74 slices shown, 15 images]
[im 8/74  mediastinal]
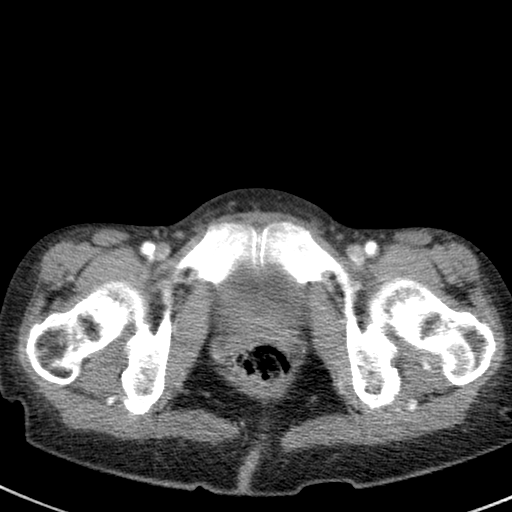
[im 8/74  bone]
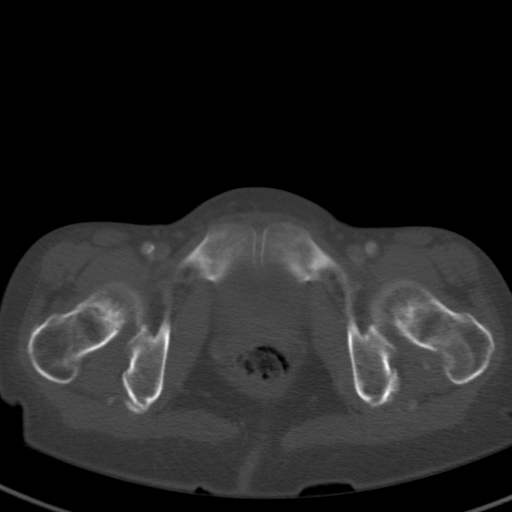
[im 15/74  mediastinal]
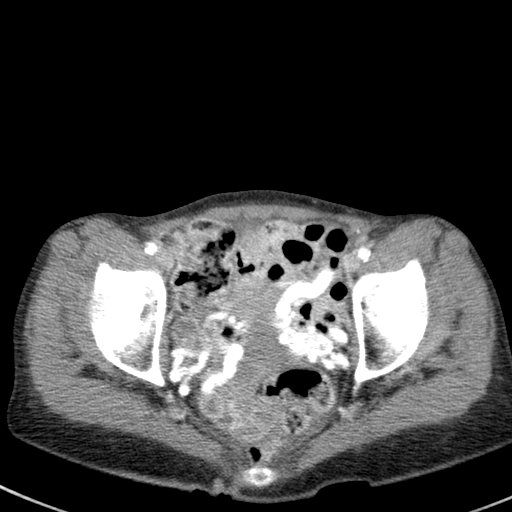
[im 22/74  mediastinal]
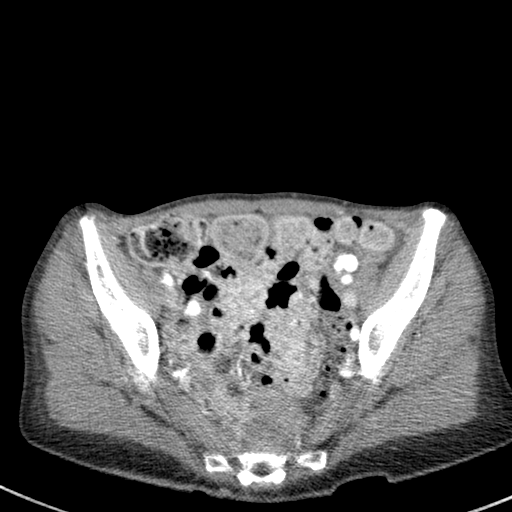
[im 30/74  mediastinal]
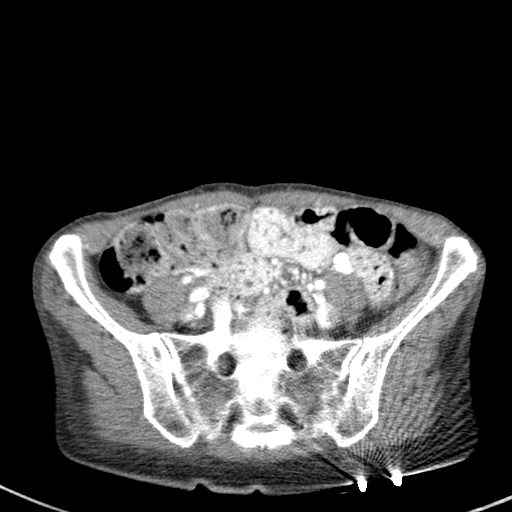
[im 37/74  mediastinal]
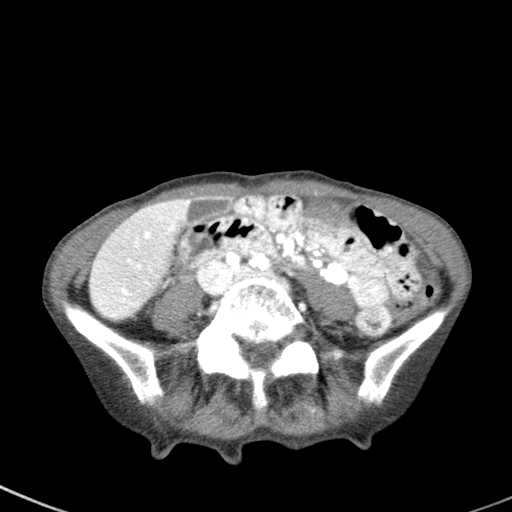
[im 44/74  mediastinal]
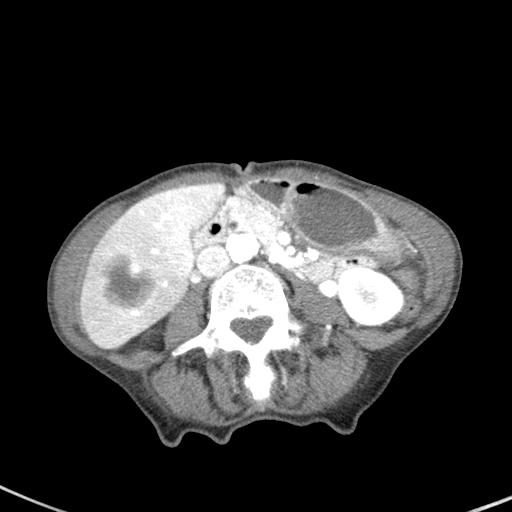
[im 44/74  lung]
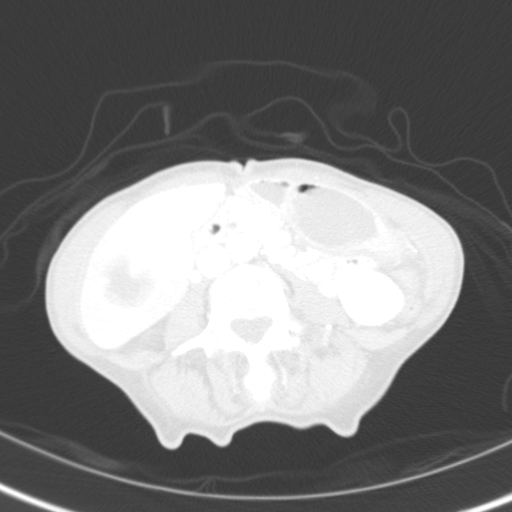
[im 52/74  mediastinal]
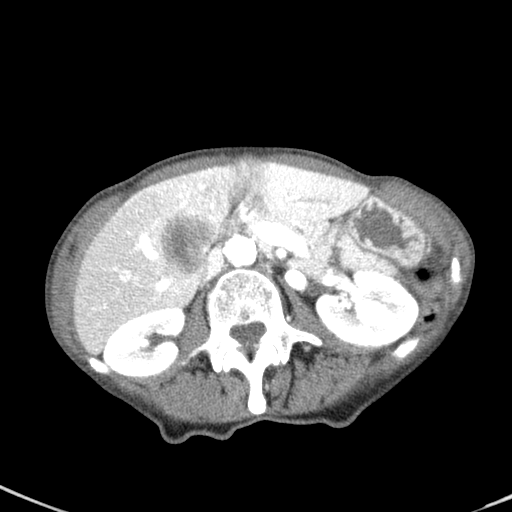
[im 52/74  lung]
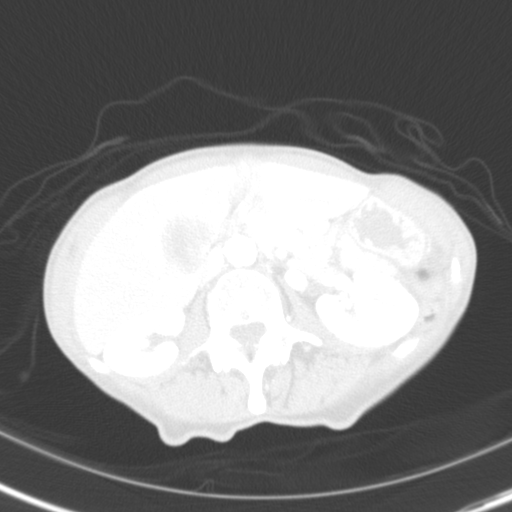
[im 59/74  mediastinal]
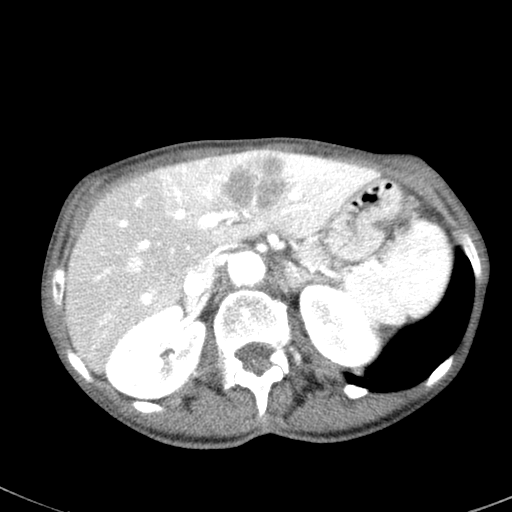
[im 59/74  lung]
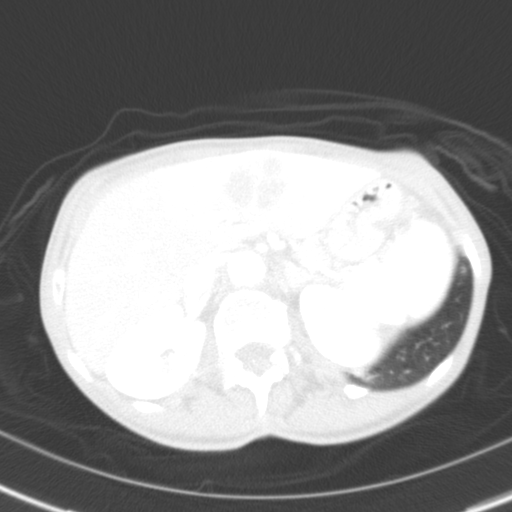
[im 66/74  mediastinal]
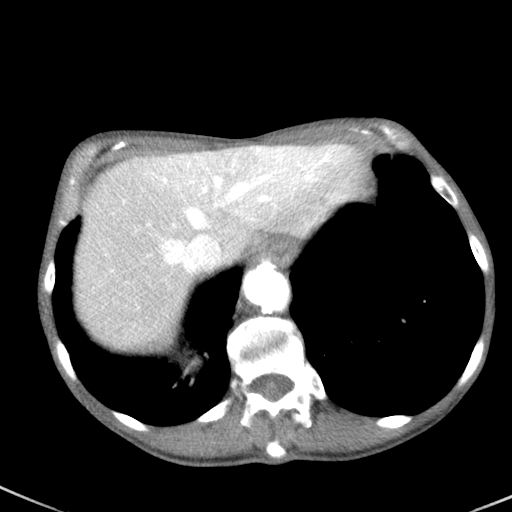
[im 66/74  lung]
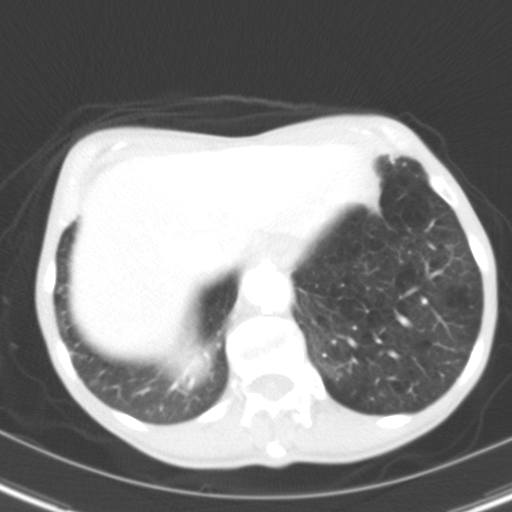
[im 66/74  bone]
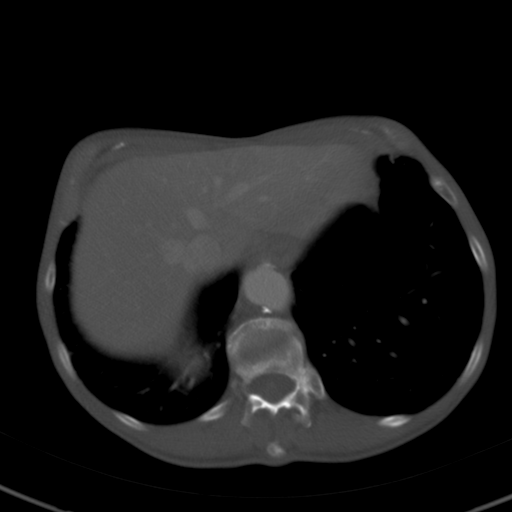

[Series 10: abd/pelvis 3.0 coronal · coronal · 0.61mm/px · 1 of 72 slices shown, 2 images]
[im 36/72  mediastinal]
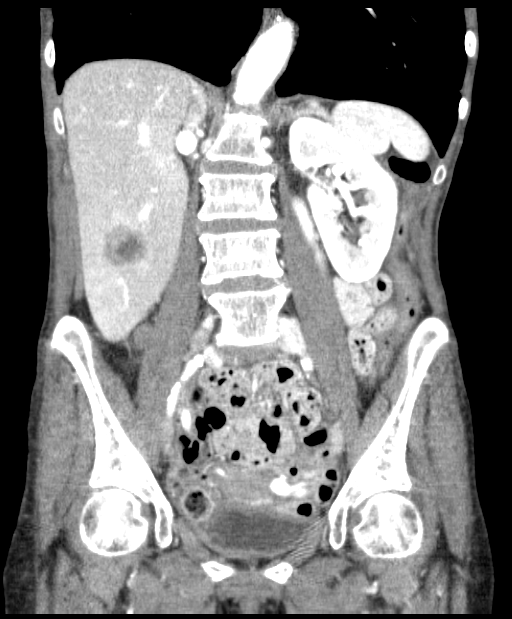
[im 36/72  bone]
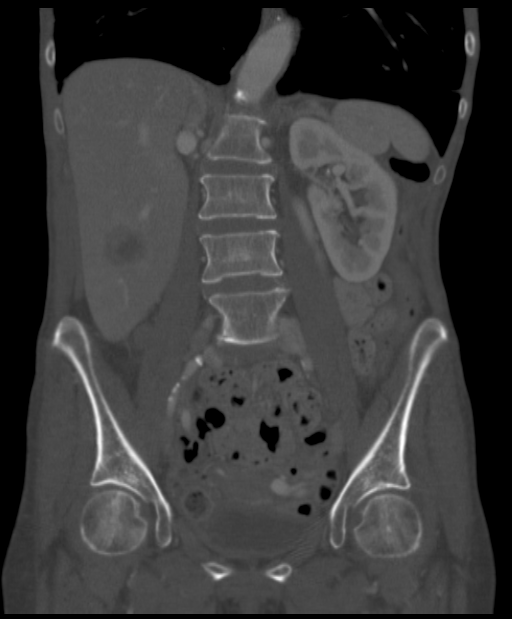

[13 of 36 positions shown; findings below may reference images not displayed]

:
CT chest as well as CT abdomen and pelvis studies are combined into
a single dictation.
